# Patient Record
Sex: Female | Born: 1955 | Race: Black or African American | Hispanic: No | Smoking: Former smoker
Health system: Southern US, Community
[De-identification: ages and names within clinical notes are randomized; demographics above are authoritative.]

## PROBLEM LIST (undated history)

## (undated) DIAGNOSIS — J449 Chronic obstructive pulmonary disease, unspecified: Secondary | ICD-10-CM

## (undated) DIAGNOSIS — I739 Peripheral vascular disease, unspecified: Secondary | ICD-10-CM

## (undated) DIAGNOSIS — K859 Acute pancreatitis without necrosis or infection, unspecified: Secondary | ICD-10-CM

## (undated) DIAGNOSIS — I5030 Unspecified diastolic (congestive) heart failure: Secondary | ICD-10-CM

## (undated) DIAGNOSIS — R569 Unspecified convulsions: Secondary | ICD-10-CM

## (undated) DIAGNOSIS — I10 Essential (primary) hypertension: Secondary | ICD-10-CM

## (undated) DIAGNOSIS — F1027 Alcohol dependence with alcohol-induced persisting dementia: Secondary | ICD-10-CM

## (undated) DIAGNOSIS — I639 Cerebral infarction, unspecified: Secondary | ICD-10-CM

## (undated) DIAGNOSIS — I4892 Unspecified atrial flutter: Secondary | ICD-10-CM

## (undated) HISTORY — PX: SHOULDER SURGERY: SHX246

---

## 2016-07-21 ENCOUNTER — Inpatient Hospital Stay (HOSPITAL_COMMUNITY)
Admission: EM | Admit: 2016-07-21 | Discharge: 2016-07-26 | DRG: 292 | Disposition: A | Discharge status: Home Health | Payer: Medicare (Managed Care) | Attending: Internal Medicine | Admitting: Internal Medicine

## 2016-07-21 ENCOUNTER — Inpatient Hospital Stay (HOSPITAL_COMMUNITY): Payer: Medicare (Managed Care)

## 2016-07-21 ENCOUNTER — Emergency Department (HOSPITAL_COMMUNITY): Payer: Medicare (Managed Care)

## 2016-07-21 ENCOUNTER — Encounter (HOSPITAL_COMMUNITY): Payer: Self-pay | Admitting: *Deleted

## 2016-07-21 DIAGNOSIS — R569 Unspecified convulsions: Secondary | ICD-10-CM | POA: Diagnosis present

## 2016-07-21 DIAGNOSIS — I9589 Other hypotension: Secondary | ICD-10-CM | POA: Diagnosis not present

## 2016-07-21 DIAGNOSIS — Z8249 Family history of ischemic heart disease and other diseases of the circulatory system: Secondary | ICD-10-CM

## 2016-07-21 DIAGNOSIS — J449 Chronic obstructive pulmonary disease, unspecified: Secondary | ICD-10-CM | POA: Diagnosis present

## 2016-07-21 DIAGNOSIS — I480 Paroxysmal atrial fibrillation: Secondary | ICD-10-CM | POA: Diagnosis present

## 2016-07-21 DIAGNOSIS — D649 Anemia, unspecified: Secondary | ICD-10-CM | POA: Diagnosis present

## 2016-07-21 DIAGNOSIS — R197 Diarrhea, unspecified: Secondary | ICD-10-CM | POA: Diagnosis present

## 2016-07-21 DIAGNOSIS — R570 Cardiogenic shock: Principal | ICD-10-CM | POA: Diagnosis present

## 2016-07-21 DIAGNOSIS — R9431 Abnormal electrocardiogram [ECG] [EKG]: Secondary | ICD-10-CM | POA: Diagnosis not present

## 2016-07-21 DIAGNOSIS — K861 Other chronic pancreatitis: Secondary | ICD-10-CM | POA: Diagnosis present

## 2016-07-21 DIAGNOSIS — Z452 Encounter for adjustment and management of vascular access device: Secondary | ICD-10-CM | POA: Diagnosis not present

## 2016-07-21 DIAGNOSIS — F1027 Alcohol dependence with alcohol-induced persisting dementia: Secondary | ICD-10-CM | POA: Diagnosis present

## 2016-07-21 DIAGNOSIS — E440 Moderate protein-calorie malnutrition: Secondary | ICD-10-CM | POA: Diagnosis present

## 2016-07-21 DIAGNOSIS — I959 Hypotension, unspecified: Secondary | ICD-10-CM | POA: Diagnosis not present

## 2016-07-21 DIAGNOSIS — R55 Syncope and collapse: Secondary | ICD-10-CM | POA: Diagnosis present

## 2016-07-21 DIAGNOSIS — Z8673 Personal history of transient ischemic attack (TIA), and cerebral infarction without residual deficits: Secondary | ICD-10-CM

## 2016-07-21 DIAGNOSIS — I481 Persistent atrial fibrillation: Secondary | ICD-10-CM | POA: Diagnosis not present

## 2016-07-21 DIAGNOSIS — I272 Pulmonary hypertension, unspecified: Secondary | ICD-10-CM | POA: Diagnosis present

## 2016-07-21 DIAGNOSIS — I950 Idiopathic hypotension: Secondary | ICD-10-CM | POA: Diagnosis not present

## 2016-07-21 DIAGNOSIS — I739 Peripheral vascular disease, unspecified: Secondary | ICD-10-CM | POA: Diagnosis present

## 2016-07-21 DIAGNOSIS — I5032 Chronic diastolic (congestive) heart failure: Secondary | ICD-10-CM | POA: Diagnosis present

## 2016-07-21 DIAGNOSIS — R000 Tachycardia, unspecified: Secondary | ICD-10-CM | POA: Diagnosis present

## 2016-07-21 DIAGNOSIS — I130 Hypertensive heart and chronic kidney disease with heart failure and stage 1 through stage 4 chronic kidney disease, or unspecified chronic kidney disease: Secondary | ICD-10-CM | POA: Diagnosis present

## 2016-07-21 DIAGNOSIS — Z9181 History of falling: Secondary | ICD-10-CM

## 2016-07-21 DIAGNOSIS — I951 Orthostatic hypotension: Secondary | ICD-10-CM | POA: Diagnosis not present

## 2016-07-21 DIAGNOSIS — Z681 Body mass index (BMI) 19 or less, adult: Secondary | ICD-10-CM

## 2016-07-21 DIAGNOSIS — N183 Chronic kidney disease, stage 3 (moderate): Secondary | ICD-10-CM | POA: Diagnosis present

## 2016-07-21 DIAGNOSIS — I4891 Unspecified atrial fibrillation: Secondary | ICD-10-CM | POA: Diagnosis not present

## 2016-07-21 DIAGNOSIS — I4892 Unspecified atrial flutter: Secondary | ICD-10-CM | POA: Diagnosis present

## 2016-07-21 DIAGNOSIS — N179 Acute kidney failure, unspecified: Secondary | ICD-10-CM | POA: Diagnosis present

## 2016-07-21 DIAGNOSIS — E872 Acidosis: Secondary | ICD-10-CM | POA: Diagnosis present

## 2016-07-21 HISTORY — DX: Acute pancreatitis without necrosis or infection, unspecified: K85.90

## 2016-07-21 HISTORY — DX: Alcohol dependence with alcohol-induced persisting dementia: F10.27

## 2016-07-21 HISTORY — PX: CENTRAL LINE INSERTION: NUR33

## 2016-07-21 HISTORY — DX: Cerebral infarction, unspecified: I63.9

## 2016-07-21 HISTORY — DX: Peripheral vascular disease, unspecified: I73.9

## 2016-07-21 HISTORY — DX: Unspecified convulsions: R56.9

## 2016-07-21 HISTORY — DX: Essential (primary) hypertension: I10

## 2016-07-21 LAB — I-STAT CHEM 8, ED
BUN: 22 mg/dL — ABNORMAL HIGH (ref 6–20)
CREATININE: 1.2 mg/dL — AB (ref 0.44–1.00)
Calcium, Ion: 1.12 mmol/L — ABNORMAL LOW (ref 1.15–1.40)
Chloride: 105 mmol/L (ref 101–111)
Glucose, Bld: 90 mg/dL (ref 65–99)
HEMATOCRIT: 33 % — AB (ref 36.0–46.0)
HEMOGLOBIN: 11.2 g/dL — AB (ref 12.0–15.0)
Potassium: 4.7 mmol/L (ref 3.5–5.1)
Sodium: 140 mmol/L (ref 135–145)
TCO2: 24 mmol/L (ref 0–100)

## 2016-07-21 LAB — CBC WITH DIFFERENTIAL/PLATELET
Basophils Absolute: 0 10*3/uL (ref 0.0–0.1)
Basophils Relative: 0 %
Eosinophils Absolute: 0 10*3/uL (ref 0.0–0.7)
Eosinophils Relative: 1 %
HEMATOCRIT: 34.8 % — AB (ref 36.0–46.0)
HEMOGLOBIN: 11 g/dL — AB (ref 12.0–15.0)
LYMPHS ABS: 1.5 10*3/uL (ref 0.7–4.0)
LYMPHS PCT: 32 %
MCH: 26.6 pg (ref 26.0–34.0)
MCHC: 31.6 g/dL (ref 30.0–36.0)
MCV: 84.3 fL (ref 78.0–100.0)
Monocytes Absolute: 0.3 10*3/uL (ref 0.1–1.0)
Monocytes Relative: 6 %
NEUTROS PCT: 61 %
Neutro Abs: 3 10*3/uL (ref 1.7–7.7)
Platelets: 153 10*3/uL (ref 150–400)
RBC: 4.13 MIL/uL (ref 3.87–5.11)
RDW: 13.8 % (ref 11.5–15.5)
WBC: 4.8 10*3/uL (ref 4.0–10.5)

## 2016-07-21 LAB — URINALYSIS, ROUTINE W REFLEX MICROSCOPIC
Bilirubin Urine: NEGATIVE
GLUCOSE, UA: NEGATIVE mg/dL
HGB URINE DIPSTICK: NEGATIVE
Ketones, ur: NEGATIVE mg/dL
Nitrite: NEGATIVE
Protein, ur: 100 mg/dL — AB
SPECIFIC GRAVITY, URINE: 1.023 (ref 1.005–1.030)
pH: 5 (ref 5.0–8.0)

## 2016-07-21 LAB — TROPONIN I

## 2016-07-21 LAB — RAPID URINE DRUG SCREEN, HOSP PERFORMED
AMPHETAMINES: NOT DETECTED
Barbiturates: NOT DETECTED
Benzodiazepines: NOT DETECTED
Cocaine: NOT DETECTED
Opiates: NOT DETECTED
TETRAHYDROCANNABINOL: NOT DETECTED

## 2016-07-21 LAB — ECHOCARDIOGRAM COMPLETE
Height: 63 in
WEIGHTICAEL: 1600 [oz_av]

## 2016-07-21 LAB — BRAIN NATRIURETIC PEPTIDE: B NATRIURETIC PEPTIDE 5: 2207.7 pg/mL — AB (ref 0.0–100.0)

## 2016-07-21 LAB — COMPREHENSIVE METABOLIC PANEL
ALBUMIN: 3.9 g/dL (ref 3.5–5.0)
ALT: 33 U/L (ref 14–54)
ANION GAP: 9 (ref 5–15)
AST: 46 U/L — ABNORMAL HIGH (ref 15–41)
Alkaline Phosphatase: 83 U/L (ref 38–126)
BUN: 19 mg/dL (ref 6–20)
CHLORIDE: 108 mmol/L (ref 101–111)
CO2: 22 mmol/L (ref 22–32)
Calcium: 8.8 mg/dL — ABNORMAL LOW (ref 8.9–10.3)
Creatinine, Ser: 1.18 mg/dL — ABNORMAL HIGH (ref 0.44–1.00)
GFR calc non Af Amer: 50 mL/min — ABNORMAL LOW (ref >60)
GFR, EST AFRICAN AMERICAN: 58 mL/min — AB (ref >60)
GLUCOSE: 94 mg/dL (ref 65–99)
Potassium: 4.7 mmol/L (ref 3.5–5.1)
SODIUM: 139 mmol/L (ref 135–145)
Total Bilirubin: 0.6 mg/dL (ref 0.3–1.2)
Total Protein: 6.6 g/dL (ref 6.5–8.1)

## 2016-07-21 LAB — POC OCCULT BLOOD, ED: Fecal Occult Bld: POSITIVE — AB

## 2016-07-21 LAB — PHENYTOIN LEVEL, TOTAL: Phenytoin Lvl: <2.5 ug/mL — ABNORMAL LOW (ref 10.0–20.0)

## 2016-07-21 LAB — ABO/RH: ABO/RH(D): A POS

## 2016-07-21 LAB — CORTISOL: Cortisol, Plasma: 32.4 ug/dL

## 2016-07-21 LAB — TYPE AND SCREEN
ABO/RH(D): A POS
Antibody Screen: NEGATIVE

## 2016-07-21 LAB — I-STAT CG4 LACTIC ACID, ED
LACTIC ACID, VENOUS: 3.37 mmol/L — AB (ref 0.5–1.9)
LACTIC ACID, VENOUS: 1.43 mmol/L (ref 0.5–1.9)

## 2016-07-21 LAB — LIPASE, BLOOD: Lipase: 12 U/L (ref 11–51)

## 2016-07-21 LAB — MRSA PCR SCREENING: MRSA by PCR: NEGATIVE

## 2016-07-21 LAB — PROTIME-INR
INR: 1.19
Prothrombin Time: 15.2 seconds (ref 11.4–15.2)

## 2016-07-21 LAB — I-STAT TROPONIN, ED: TROPONIN I, POC: 0.02 ng/mL (ref 0.00–0.08)

## 2016-07-21 MED ORDER — PIPERACILLIN-TAZOBACTAM 3.375 G IVPB
3.3750 g | Freq: Three times a day (TID) | INTRAVENOUS | Status: DC
  Administered 2016-07-21 – 2016-07-23 (×6): 3.375 g via INTRAVENOUS
  Filled 2016-07-21 (×7): qty 50

## 2016-07-21 MED ORDER — HEPARIN SODIUM (PORCINE) 5000 UNIT/ML IJ SOLN
5000.0000 [IU] | Freq: Three times a day (TID) | INTRAMUSCULAR | Status: DC
  Administered 2016-07-21 – 2016-07-25 (×9): 5000 [IU] via SUBCUTANEOUS
  Filled 2016-07-21 (×13): qty 1

## 2016-07-21 MED ORDER — SODIUM CHLORIDE 0.9 % IV BOLUS (SEPSIS)
30.0000 mL/kg | Freq: Once | INTRAVENOUS | Status: AC
  Administered 2016-07-21: 1362 mL via INTRAVENOUS

## 2016-07-21 MED ORDER — PHENYLEPHRINE HCL 10 MG/ML IJ SOLN
0.0000 ug/min | Freq: Once | INTRAVENOUS | Status: AC
  Administered 2016-07-21: 20 ug/min via INTRAVENOUS
  Filled 2016-07-21: qty 1

## 2016-07-21 MED ORDER — PIPERACILLIN-TAZOBACTAM 3.375 G IVPB 30 MIN
3.3750 g | Freq: Once | INTRAVENOUS | Status: AC
  Administered 2016-07-21: 3.375 g via INTRAVENOUS
  Filled 2016-07-21: qty 50

## 2016-07-21 MED ORDER — LIDOCAINE HCL (PF) 1 % IJ SOLN
INTRAMUSCULAR | Status: AC
  Filled 2016-07-21: qty 5

## 2016-07-21 MED ORDER — SODIUM CHLORIDE 0.9 % IV BOLUS (SEPSIS)
1000.0000 mL | Freq: Once | INTRAVENOUS | Status: AC
  Administered 2016-07-21: 1000 mL via INTRAVENOUS

## 2016-07-21 MED ORDER — SODIUM CHLORIDE 0.9 % IV SOLN
Freq: Once | INTRAVENOUS | Status: AC
  Administered 2016-07-21: 11:00:00 via INTRAVENOUS

## 2016-07-21 MED ORDER — PHENYLEPHRINE HCL 10 MG/ML IJ SOLN
0.0000 ug/min | INTRAVENOUS | Status: DC
  Administered 2016-07-21: 20 ug/min via INTRAVENOUS
  Filled 2016-07-21: qty 1

## 2016-07-21 MED ORDER — SODIUM CHLORIDE 0.9 % IV SOLN: 250.0000 mL | INTRAVENOUS | Status: DC | PRN

## 2016-07-21 MED ORDER — VANCOMYCIN HCL IN DEXTROSE 750-5 MG/150ML-% IV SOLN
750.0000 mg | INTRAVENOUS | Status: DC
  Administered 2016-07-22 – 2016-07-23 (×2): 750 mg via INTRAVENOUS
  Filled 2016-07-21 (×2): qty 150

## 2016-07-21 MED ORDER — VANCOMYCIN HCL IN DEXTROSE 1-5 GM/200ML-% IV SOLN
1000.0000 mg | Freq: Once | INTRAVENOUS | Status: AC
  Administered 2016-07-21: 1000 mg via INTRAVENOUS
  Filled 2016-07-21: qty 200

## 2016-07-21 MED ORDER — ASPIRIN 81 MG PO CHEW
81.0000 mg | CHEWABLE_TABLET | Freq: Every day | ORAL | Status: DC
  Administered 2016-07-21 – 2016-07-26 (×6): 81 mg via ORAL
  Filled 2016-07-21 (×6): qty 1

## 2016-07-21 MED ORDER — LEVETIRACETAM 750 MG PO TABS
750.0000 mg | ORAL_TABLET | Freq: Two times a day (BID) | ORAL | Status: DC
  Administered 2016-07-21 – 2016-07-26 (×9): 750 mg via ORAL
  Filled 2016-07-21 (×13): qty 1

## 2016-07-21 NOTE — ED Triage Notes (Signed)
Patient comes in [facility] post poss syncopal episode. EMS arrival patient a/ox4, but bp low and weak pulses. EMS BP 85/64, HR 82-135 afibrvr. Hx dementia, hypotension, seizures. Given 200 of NS fluids in route. 22 in [place]. fsbs 152.

## 2016-07-21 NOTE — Progress Notes (Signed)
[facility] Progress Note Patient Name: Brittany [name] DOB: [birth year] MRN: [id number]   Date of Service  07/21/2016  HPI/Events of Note  No DVT prophylaxis  eICU Interventions  Sub q heparin     Intervention Category Intermediate Interventions: Best-practice therapies (e.g. DVT, beta blocker, etc.)  Simonne Maffucci 07/21/2016, 8:13 PM

## 2016-07-21 NOTE — Consult Note (Addendum)
Cardiology Consult    Patient ID: Brittany [name] MRN: [id number], DOB/AGE: 01/23/1956   Admit date: 07/21/2016 Date of Consult: 07/21/2016  Primary Physician: No PCP Per Patient Primary Cardiologist: New Requesting Provider: Dr. Johnney Killian Reason for Consultation: ?Cardiogenic Shock, Hypotension  Patient Profile    61 yo female with dementia, seizures, CHF, AF, HTN, CVA, CKD III, COPD, chronic alcoholism and PAD who presented to the ED with c/o generalized weakness, dizziness, and diaphoresis.   Past Medical History   Past Medical History:  Diagnosis Date  . Atrial fibrillation ([place])   . CHF (congestive heart failure) ([place])   . CVA (cerebral vascular accident) ([place])   . 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 HTN (hypertension)   . PAD (peripheral artery disease) ([place])   . Pancreatitis   . Seizures ([place])     No past surgical history on file.   Allergies  Not on File  History of Present Illness    Brittany [name] is a 61 yo female with PMH of dementia, seizures, gluacoma, pancreatitis, CHF (unknown type),  AF, CVA, CKD III, COPD and PAD. She is currently a patient of [facility]. Lives with her son and his family currently. According to notes from her PCP, Dr. Bradd Burner at her last visit her dementia had further progressed. She was started on Seroquel to help with agitation. She was reportedly was hospitalized in 9/17 after having a seizure. 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 During this hospitalization her primary team made the choice to stop her anticoagulation, Xarelto, given reports of ongoing falls. She was continued on 325 ASA daily.   Difficult to obtain hx as patient has dementia and family is not currently at the bedside. According to notes, and ED MD Brittany [name] was brought to the ED via EMS after experiencing generalized weakness and dizziness. The patient reports she was in her usual state of health up until this  morning. States she felt "wozzy" this morning when she got up. When to the bathroom and washed up. Had a bowel movement, and the came into the living room. States she then felt dizzy and weak. Sat down on the couch and family reported a questionable syncopal episode. They do not think that she lost consciousness. No reported seizure like activity. She did become cold and clammy. Denies any chest pain, dyspnea, nausea/vomiting, or lower extremity edema. On EMS arrival she was noted to be hypotensive with systolic BP in the 44W.   In the ED her labs showed stable electrolytes, Hgb 11.0, trop 0.02, lactic acid 3.37. FOBT was positive. CXR showed mild cardiomegaly, but no acute findings. She was given fluid boluses with little improvement in her pressure. Also started on board spectrum antibiotics. PCCM has been called for admission, and central line placement. EKG showed atrial flutter with repolarization abnormalities in diffuse leads, prominent in v2-v4 with prolonged QT. Bedside US done by PCCM reported reduced EF, some LV wall motion abnormalities and dilated LA.  Inpatient Medications    . lidocaine (PF)      . [START ON 07/22/2016] vancomycin  750 mg Intravenous Q24H    Family History    Family History  Problem Relation Age of Onset  . Hypertension Father     Social History    Social History   Social History  . Marital status: Widowed    Spouse name: N/A  . Number of children: N/A  . Years of  education: N/A   Occupational History  . Not on file.   Social History Main Topics  . Smoking status: Former Research scientist (life sciences)  . Smokeless tobacco: Never Used  . Alcohol use Yes  . Drug use: No  . Sexual activity: Not on file   Other Topics Concern  . Not on file   Social History Narrative  . No narrative on file     Review of Systems   General:  No chills, fever, night sweats or weight changes.  Cardiovascular: See HPI Dermatological: No rash, lesions/masses Respiratory: No cough,  dyspnea Urologic: No hematuria, dysuria Abdominal:   No nausea, vomiting, diarrhea, bright red blood per rectum, melena, or hematemesis Neurologic:  No visual changes, wkns, changes in mental status. All other systems reviewed and are otherwise negative except as noted above.  Physical Exam    Blood pressure (!) 83/62, pulse 75, temperature 97.4 F (36.3 C), temperature source Oral, resp. rate 19, height '5\' 3"'$  (1.6 m), weight 100 lb (45.4 kg), SpO2 (!) 62 %.  General: Pleasant but confused AAF, NAD Psych: Normal affect. Neuro: Alert and oriented X 3. Moves all extremities spontaneously. HEENT: Normal  Neck: Supple without bruits or JVD. Lungs:  Resp regular and unlabored, CTA. Heart: Irreg Irreg no s3, s4, or murmurs. Abdomen: Soft, non-tender, non-distended, BS + x 4.  Extremities: No clubbing, cyanosis or edema. DP/PT/Radials 2+ and equal bilaterally. Upper and lower extremities cool to touch.   Labs    Troponin (Point of Care Test)  Recent Labs  07/21/16 1204  TROPIPOC 0.02   No results for input(s): CKTOTAL, CKMB, TROPONINI in the last 72 hours. Lab Results  Component Value Date   WBC 4.8 07/21/2016   HGB 11.2 (L) 07/21/2016   HCT 33.0 (L) 07/21/2016   MCV 84.3 07/21/2016   PLT 153 07/21/2016     Recent Labs Lab 07/21/16 1140 07/21/16 1206  NA 139 140  K 4.7 4.7  CL 108 105  CO2 22  --   BUN 19 22*  CREATININE 1.18* 1.20*  CALCIUM 8.8*  --   PROT 6.6  --   BILITOT 0.6  --   ALKPHOS 83  --   ALT 33  --   AST 46*  --   GLUCOSE 94 90   No results found for: CHOL, HDL, LDLCALC, TRIG No results found for: [facility]   Radiology Studies    Dg Chest Port 1 View  Result Date: 07/21/2016 CLINICAL DATA:  Central line placement EXAM: PORTABLE CHEST 1 VIEW COMPARISON:  07/21/2016 FINDINGS: There is a left jugular central venous catheter with the tip projecting over the SVC. There is bilateral diffuse interstitial thickening. There is a trace right pleural effusion.  There is no left pleural effusion. There is no pneumothorax. There is mild stable cardiomegaly. The osseous structures are unremarkable. IMPRESSION: Mild CHF. Left jugular central venous catheter with the tip projecting over the SVC. Electronically Signed   By: Kathreen Devoid   On: 07/21/2016 15:12   Dg Chest Port 1 View  Result Date: 07/21/2016 CLINICAL DATA:  Syncope.  Chest pain.  Shortness of breath. EXAM: PORTABLE CHEST 1 VIEW COMPARISON:  None. FINDINGS: There is borderline cardiomegaly. Pulmonary vascularity is normal and the lungs are clear. No acute bone abnormality. Old nonunion fracture of the distal right clavicle. IMPRESSION: Borderline cardiomegaly.  No other significant abnormalities. Electronically Signed   By: Lorriane Shire M.D.   On: 07/21/2016 11:23    ECG & Cardiac Imaging  EKG: atrial flutter with repolarization abnormalities in diffuse leads, prominent in v2-v4 with prolonged QT.  Echo: LVH, Normal LV size and function. Normal RV size and function. Biatrial enlargement and no pericardial effusion. (Quick look at Stat echo done at 4:25 PM in ER). [facility]  Assessment & Plan    62 yo female with dementia, seizures, CHF, AF, HTN, CVA, CKD III, COPD, chronic alcoholism and PAD who presented to the ED with c/o generalized weakness, dizziness, and diaphoresis.   1. Hypotension: Cardiology has been called in relation to concern for cardiogenic shock given her low blood pressure and slightly elevated HR. In review of records it appears that her baseline BPs run in the low 90s, she is not on any antihypertensive agents. Her mentation is at her baseline according to family. Last blood pressure noted on the monitor was 283 systolic. Denies any chest pain or dyspnea at this time.  -- Will obtain stat echo to assess for effusion, thrombus, evidence of heart strain - see above.  2. Hx of AF: Seems this is a hx of PAF. EKG this admission shows Atrial flutter. Notes from her PCP show that she  was taken off Xarelto, and continued only on ASA 325 given hx of falls.   3. Elevated Lactic Acid: Currently being treated with antibiotics started in the ED. CXR clear, UA pending.   4. CKD III: Cr 1.2 on admission. Unclear her baseline, not found in records.   5. CHF: Unknown type. Does not appear to be volume overloaded at this time. Will check BNP as she has been on oral lasix in the past, but not on current medication list.   6. Hypotension: Given IV fluids in the ED with some improvement in pressure. Notes from her PCP show soft blood pressures in the past. Was given 3L of IV fluids in the ED. Will need to monitor volume status given reported hx of CHF.  Barnet Pall, NP-C Pager [phone] 07/21/2016, 4:01 PM   The patient has been seen in conjunction with Reino Bellis, NP-C. All aspects of care have been considered and discussed. The patient has been personally interviewed, examined, and all clinical data has been reviewed.   Obtained data from [place] on this 61 yo female with dementia, seizure disorder, CHF, PAF(no anticoagulation) on once a day Sotalol, HTN, CVA, CKD III, COPD, chronic alcoholism and PAD who presented to the ED with c/o generalized weakness, dizziness, and diaphoresis. Denies chest pain ans dyspnea. H/O baseline SBP running in mid 90's.  In ER SBP < 80 mmHg, HR 80, hypoxia, ECG atrial flutter with variable response and repolarization abnormality, lactic acidosis and stat echo with LVH, EF >50%, normal right heart size and no pericardial effusion.  Etiology of hypotension is not clear. Not cardiogenic in etiology. R/O sepsis vs medication excess.  Plan cycle markers, IV fluid, hold sedatives, culture. Sotalol 80 mg once daily is not helpful and should be stopped. If rhythm control needed, consider amiodarone.  We will follow.

## 2016-07-21 NOTE — H&P (Addendum)
Name: Brittany [name] MRN: [id number] DOB: Mar 30, 1956    LOS: 0  PCCM ADMISSION NOTE  History of Present Illness: Brittany [name], 61 y.o lady with past medical history significant for seizures  brought to ED after experiencing generalized weakness and dizziness. She is a patient at [facility] program, when she was getting ready to go there this morning she felt sudden generalized weakness and dizziness, associated with diaphoresis, which make her to sit down, according to son she never lost consciousness, he denies any seizure-like activity.the patient became cold and clammy.She denies any chest pain or palpitations.  According to her son she did had a couple of TIA in the past ,  no documented history available, patient and her son denied any history of heart problem. She denies any headache , but do endorse having some blurry vision during this event.she denies any focal weakness. No history of recent fever, upper respiratory symptoms, nausea, vomiting or diarrhea.No urinary complaints.    Lines / Drains: 1/5 IJ>>>  Cultures: Blood Culture>>  Antibiotics: Vanc. Zosyn  Tests / Events:     Past Medical History:  Diagnosis Date  . Seizures ([place])    No past surgical history on file. Prior to Admission medications   Not on File   Allergies Not on File  Family History No family history on file.  Social History  reports that she has never smoked. She has never used smokeless tobacco. Her alcohol and drug histories are not on file.  Review Of Systems  11 points review of systems is negative with an exception of listed in HPI.  Vital Signs: Temp:  [97.4 F (36.3 C)] 97.4 F (36.3 C) (01/05 1031) Pulse Rate:  [62-75] 75 (01/05 1215) Resp:  [14-24] 20 (01/05 1300) BP: (59-100)/(44-73) 76/62 (01/05 1300) SpO2:  [34 %-62 %] 62 % (01/05 1215) Weight:  [45.4 kg (100 lb)] 45.4 kg (100 lb) (01/05 1026) No intake/output data recorded.  Physical Examination: General:  Lethargic  looking lady, in no acute distress. Neuro: Alert and oriented. Follow commands. Cranial nerves grossly intact. Strength and sensations grossly intact bilaterally.   HEENT:  PERRL, EOMI. no scleral icterus, Neck:  Supple, no JVD, no carotid bruit.  Cardiovascular:  Irregularly irregular. Lungs:  Clear bilaterally  Abdomen: Soft, nontender, nondistended, bowel sounds positive.  Musculoskeletal: No gross abnormality, no edema, perifrontal pulses present bilaterally. Skin:  Cold and moist.no rash noted.  Ventilator settings:    Labs and Imaging:  Reviewed.  Please refer to the Assessment and Plan section for relevant results.  DISCUSSION:   Ms. Brittany [name], 61 y.o lady with past medical history significant for seizures  brought to ED after experiencing generalized weakness and dizziness. She is a patient at [facility] program, when she was getting ready to go there this morning she felt sudden generalized weakness and dizziness, associated with diaphoresis, which make her to sit down, according to son she never lost consciousness, he denies any seizure-like activity.the patient became cold and clammy.She denies any chest pain or palpitations.  According to her son she did had a couple of TIA in the past ,  no documented history available, patient and her son denied any history of heart problem.   Bed Site ECHO shows reduced EF, some LV wall motion abnormalities and dilated LA. ECG significant for atrial flutter,ST abnormalities and prolonged QTc.No previous ECG to compare. She was hypotensive with BP of 60/42, tachycardic with lactic acidosis. No leukocytosis or fever. 3L of bolus were unable to bring her  BP up. Central line was placed for pressors. Her shock is more consistent with cardiogenic in origin. She was admitted to ICU for shock and pressor needs.  ASSESSMENT / PLAN:  PULMONARY A: No acute issue. P:   Supplemental O2 as needed to keep SpO2 > 92% Monitor  CARDIOVASCULAR A:   Shock - r/o Cardiogenic Unclear etiology AF  P:  Telemetry monitoring MAP goal 31mHg Phenylephrine titrated for MAP goal IVF hydration, some caution Heparin infusion  Trend Lactic acid Trend Trop. BNP Cardiology consult. ECHO Repeat ECG in the morning.  RENAL A:   Mildly Elevated Creatinine, Unknown baseline/CKD P:    Follow BMP  GASTROINTESTINAL A:   GI prophylaxis.  P:    PPI NPO  HEMATOLOGIC A:   Anemia   P:  Follow CBC  INFECTIOUS A:   SIRS/sepsis - NOT clear   P:   Broad spectrum antibiotics Follow cultures Trend PCT  ENDOCRINE A:   R/o rel AI  P:   Check Cortisol.  NEUROLOGIC A:   H/O Seizure, last episode in 06/17. H/O TIA?  P:   Continue home dose of Keppra and Phenytoin. CT head   FAMILY  - Updates: patient updated in ED  - Inter-disciplinary family meet or Palliative Care meeting due by:  1/12   SLorella Nimrod1/11/2016, 1:57 PM   STAFF NOTE: I, DMerrie Roof MD FACP have personally reviewed patient's available data, including medical history, events of note, physical examination and test results as part of my evaluation. I have discussed with resident/NP and other care providers such as pharmacist, RN and RRT. In addition, I personally evaluated patient and elicited key findings of: awake in bed, cold ext, no crackles, abdo soft, jvd wnl laying flat, MAP low, pcxr with fluid fissure rt, int edema ?, history was sudden change in ms that resolved, no clear seizure activity noted but possible still with lactic and history of seizures, but now continued shock state, fluid provided, empiric abx okay but its is not clear the etiology of her shock, I did limited echo  (see note), she is on sotalol and dilantin, assessing level dilantin, stat start neo  Then place line, get cvp, have cardiology evaluation has ST changes that may be chronic?, get pct, bnp, and have map goal 60 with repeat lactic acid, may need noninvasive  monitoring / swan assessment, would like to use pct for neg predictive value if able, I updated son at bedside in full , wait official stat echo, GET SVo2 The patient is critically ill with multiple organ systems failure and requires high complexity decision making for assessment and support, frequent evaluation and titration of therapies, application of advanced monitoring technologies and extensive interpretation of multiple databases.   Critical Care Time devoted to patient care services described in this note is 50 Minutes. This time reflects time of care of this signee: DMerrie Roof MD FACP. This critical care time does not reflect procedure time, or teaching time or supervisory time of PA/NP/Med student/Med Resident etc but could involve care discussion time. Rest per NP/medical resident whose note is outlined above and that I agree with   DLavon Paganini FTitus Mould MD, FBurtonPgr: [facility] 07/21/2016 4:22 PM

## 2016-07-21 NOTE — ED Notes (Signed)
ECHO tech at bedside.

## 2016-07-21 NOTE — ED Notes (Signed)
NP at bedside to place central line.

## 2016-07-21 NOTE — Procedures (Signed)
Limited echo 1 images poor parastenal and apical 2. LVH\ 3. Reduced LV fxn 40%? 4. No sig perciardial effusion 5. RV fxn intact and slender 6, LV cavity not dolated 7. Large atrium left  Lavon Paganini. Titus Mould, MD, [place] Pgr: [place] [facility]

## 2016-07-21 NOTE — ED Notes (Signed)
EMS 1 Liter NS bolus completed.

## 2016-07-21 NOTE — ED Notes (Signed)
EDP at bedside placing u/s IV. RN assisting.

## 2016-07-21 NOTE — ED Notes (Signed)
MD aware of low BP. Central line kit set up at bedside per MD request.

## 2016-07-21 NOTE — ED Notes (Signed)
Patient has been given a total of 3 Liters of NS. Patient a/ox4. bp still low. MD aware. Attempting to obtain better access.

## 2016-07-21 NOTE — Procedures (Signed)
Central Venous Catheter Insertion Procedure Note Brittany [name] [id number] 1956-03-13  Procedure: Insertion of Central Venous Catheter Indications: Drug and/or fluid administration  Procedure Details Consent: Risks of procedure as well as the alternatives and risks of each were explained to the (patient/caregiver).  Consent for procedure obtained. Time Out: Verified patient identification, verified procedure, site/side was marked, verified correct patient position, special equipment/implants available, medications/allergies/relevent history reviewed, required imaging and test results available.  Performed  Maximum sterile technique was used including antiseptics and cap, gown and sheets. Skin prep: Iodine solution; local anesthetic administered A antimicrobial bonded/coated triple lumen catheter was placed in the left internal jugular vein using the Seldinger technique.  Evaluation Blood flow good Complications: No apparent complications Patient did tolerate procedure well. Chest X-ray ordered to verify placement.  CXR: normal.  Sumayya Amin 07/21/2016, 4:03 PM [place] consented  Lavon Paganini. Titus Mould, MD, [place] Pgr: [place] [facility]

## 2016-07-21 NOTE — ED Provider Notes (Signed)
[facility] DEPT Provider Note   CSN: [id number] Arrival date & time: 07/21/16  1021     History   Chief Complaint Chief Complaint  Patient presents with  . Loss of Consciousness  . Atrial Fibrillation  . Hypotension    HPI Brittany [name] is a 61 y.o. female.  HPI Patient is alert and interactive but poor historian. There are no additional historians who witnessed the event. Patient states that she went into her bathroom to get "cleaned up" and that's last thing she really recalls. She thought perhaps she had a seizure because she has had them in the past. She reports the only medication she takes his Dilantin. She denies any symptoms that she really recalls upon awakening. She denies headache or chest pain. She doesn't recall having any symptoms preceding the event. Patient reports she's felt all right over the past 3 days. She doesn't endorse any specific positive symptoms on review of systems. She does however state that she's been anemic in the past and she is wondering if that might be the problem too. She denies taking daily aspirin or ibuprofen. She reports she takes Tylenol sometimes. She reports she lives with family members who called EMS but they're not here at this time for additional history. She denies knowledge of any prior diagnosis of atrial fibrillation. Past Medical History:  Diagnosis Date  . Seizures ([place])     There are no active problems to display for this patient.   No past surgical history on file.  OB History    No data available       Home Medications    Prior to Admission medications   Not on File    Family History No family history on file.  Social History Social History  Substance Use Topics  . Smoking status: Never Smoker  . Smokeless tobacco: Never Used  . Alcohol use Not on file     Allergies   Patient has no allergy information on record.   Review of Systems Review of Systems 10 Systems reviewed and are negative for  acute change except as noted in the HPI.  Physical Exam Updated Vital Signs BP (!) 70/61   Pulse 75   Temp 97.4 F (36.3 C) (Oral)   Resp 18   Ht '5\' 3"'$  (1.6 m)   Wt 100 lb (45.4 kg)   SpO2 (!) 62%   BMI 17.71 kg/m   Physical Exam  Constitutional:  Patient is very thin borderline cachectic. She is alert and interactive. No respiratory distress. Mental status is clear. Slightly pale in appearance.  HENT:  Head: Normocephalic and atraumatic.  Nose: Nose normal.  Mouth/Throat: Oropharynx is clear and moist.  Eyes: EOM are normal. Pupils are equal, round, and reactive to light.  Neck: Neck supple.  Cardiovascular:  Irregularly irregular tachycardia. Slightly distant heart sounds. Do not appreciate murmur.  Pulmonary/Chest:  Patient does not exhibit any respiratory distress at rest. Breath sounds are soft. They seem diminished at the right base. No gross rails or rhonchi.  Abdominal: Soft. She exhibits no distension. There is no tenderness. There is no guarding.  Genitourinary:  Genitourinary Comments: Stool is bright yellow and mucousy. No melena or blood.  Musculoskeletal: Normal range of motion. She exhibits no edema or tenderness.  Patient does not have any peripheral edema or calf tenderness. However have fingers that are hypoperfused in a distinct line very suggestive of Raynaud type perfusion pattern. Left hand has heard fourth and fifth digits with pallor of  the distal portions from the MCP. The index and thumb have normal-appearing perfusion. On the right, digits 2 through 5 have similar appearance. Thumb appears to have normal perfusion. Feet are slightly cool but the perfusion appears normal without any pallor of digits. No Edema.  Neurological: She is alert. No cranial nerve deficit. She exhibits normal muscle tone. Coordination normal.  Skin: Skin is warm and dry. There is pallor.  Psychiatric: She has a normal mood and affect.     ED Treatments / Results  Labs (all  labs ordered are listed, but only abnormal results are displayed) Labs Reviewed  COMPREHENSIVE METABOLIC PANEL - Abnormal; Notable for the following:       Result Value   Creatinine, Ser 1.18 (*)    Calcium 8.8 (*)    AST 46 (*)    GFR calc non Af Amer 50 (*)    GFR calc Af Amer 58 (*)    All other components within normal limits  CBC WITH DIFFERENTIAL/PLATELET - Abnormal; Notable for the following:    Hemoglobin 11.0 (*)    HCT 34.8 (*)    All other components within normal limits  I-STAT CG4 LACTIC ACID, ED - Abnormal; Notable for the following:    Lactic Acid, Venous 3.37 (*)    All other components within normal limits  I-STAT CHEM 8, ED - Abnormal; Notable for the following:    BUN 22 (*)    Creatinine, Ser 1.20 (*)    Calcium, Ion 1.12 (*)    Hemoglobin 11.2 (*)    HCT 33.0 (*)    All other components within normal limits  POC OCCULT BLOOD, ED - Abnormal; Notable for the following:    Fecal Occult Bld POSITIVE (*)    All other components within normal limits  CULTURE, BLOOD (ROUTINE X 2)  CULTURE, BLOOD (ROUTINE X 2)  URINE CULTURE  LIPASE, BLOOD  PROTIME-INR  URINALYSIS, ROUTINE W REFLEX MICROSCOPIC  RAPID URINE DRUG SCREEN, HOSP PERFORMED  PHENYTOIN LEVEL, TOTAL  I-STAT TROPOININ, ED  I-STAT CG4 LACTIC ACID, ED  I-STAT CG4 LACTIC ACID, ED  TYPE AND SCREEN  ABO/RH    EKG  EKG Interpretation  Date/Time:  Friday July 21 2016 11:16:52 EST Ventricular Rate:  114 PR Interval:    QRS Duration: 77 QT Interval:  394 QTC Calculation: 578 R Axis:   80 Text Interpretation:  Atrial flutter Repol abnrm suggests ischemia, diffuse leads Prolonged QT interval progressed ST elevation with reciprocal change. Ischemic. Confirmed by Johnney Killian, MD, Jeannie Done [id number]) on 07/21/2016 11:42:49 AM       Radiology Dg Chest Port 1 View  Result Date: 07/21/2016 CLINICAL DATA:  Syncope.  Chest pain.  Shortness of breath. EXAM: PORTABLE CHEST 1 VIEW COMPARISON:  None. FINDINGS: There is  borderline cardiomegaly. Pulmonary vascularity is normal and the lungs are clear. No acute bone abnormality. Old nonunion fracture of the distal right clavicle. IMPRESSION: Borderline cardiomegaly.  No other significant abnormalities. Electronically Signed   By: Lorriane Shire M.D.   On: 07/21/2016 11:23    Procedures Procedures (including critical care time) CRITICAL CARE Performed by: Charlesetta Shanks   Total critical care time: 60 minutes  Critical care time was exclusive of separately billable procedures and treating other patients.  Critical care was necessary to treat or prevent imminent or life-threatening deterioration.  Critical care was time spent personally by me on the following activities: development of treatment plan with patient and/or surrogate as well as nursing, discussions with consultants,  evaluation of patient's response to treatment, examination of patient, obtaining history from patient or surrogate, ordering and performing treatments and interventions, ordering and review of laboratory studies, ordering and review of radiographic studies, pulse oximetry and re-evaluation of patient's condition.  Angiocath insertion Performed by: Charlesetta Shanks  Consent: Verbal consent obtained. Risks and benefits: risks, benefits and alternatives were discussed Time out: Immediately prior to procedure a "time out" was called to verify the correct patient, procedure, equipment, support staff and site/side marked as required.  Preparation: Patient was prepped and draped in the usual sterile fashion.  Vein Location: left AC  Ultrasound Guided  Gauge: 20  Several attempts at cannulating antecubital vein. Was unable to thread the catheter and get blood return.   Medications Ordered in ED Medications  lidocaine (PF) (XYLOCAINE) 1 % injection (not administered)  vancomycin (VANCOCIN) IVPB 750 mg/150 ml premix (not administered)  piperacillin-tazobactam (ZOSYN) IVPB 3.375 g  (not administered)  phenylephrine (NEO-SYNEPHRINE) 10 mg in dextrose 5 % 250 mL (0.04 mg/mL) infusion (not administered)  0.9 %  sodium chloride infusion ( Intravenous New Bag/Given 07/21/16 1113)  sodium chloride 0.9 % bolus 1,000 mL (0 mLs Intravenous Stopped 07/21/16 1411)  piperacillin-tazobactam (ZOSYN) IVPB 3.375 g (3.375 g Intravenous New Bag/Given 07/21/16 1229)  vancomycin (VANCOCIN) IVPB 1000 mg/200 mL premix (1,000 mg Intravenous New Bag/Given 07/21/16 1230)  sodium chloride 0.9 % bolus 1,362 mL (0 mL/kg  45.4 kg Intravenous Stopped 07/21/16 1411)     Initial Impression / Assessment and Plan / ED Course  I have reviewed the triage vital signs and the nursing notes.  Pertinent labs & imaging results that were available during my care of the patient were reviewed by me and considered in my medical decision making (see chart for details).  Clinical Course   Consultation: EKG reviewed Dr. Zoe Lan. Appears more likely ST changes are due to atrial flutter pattern and not acute ischemia. Will continue medical evaluation and treatment.   11:15 reassessed. Nurse reports patient's systolic blood pressure dropped to mid 70s. Patient remains alert without respiratory distress. She does not report any change in her general feeling. Interestingly, the Raynaud like appearance of select digits on the hand has resolved and now all of the fingers appear perfused. Still suspect anemia due to pallor. Will have nursing staff send i-STAT. 12:20 despite low blood pressure, patient's condition has not changed. Mental status remains stable and no respiratory distress. Currently patient refuses any IV attempt in her neck to place central line. She cites pain and too risky. Will make an attempt for a third, larger peripheral IV site and readdress need for central line with patient.  Consult: Intensivist. Dr. Titus Mould has evaluated the patient in the emergency department. Will initiate Neo-Synephrine. Final Clinical  Impressions(s) / ED Diagnoses   Final diagnoses:  Syncope and collapse  Hypotension, unspecified hypotension type  Atrial flutter, unspecified type [facility])  Patient presents with a syncopal type episode and limited symptoms leading up to this. Patient remained persistently hypotensive. Fluid hydration and sepsis protocol initiated. Patient's first chest x-ray does not show volume overloaded she does not have peripheral edema or rails. However, after all other results are returned and patient does not show signs of anemia, leukocytosis, unlikely medication error as medications are all administered to the patient and placed simple boxes, no focal sign of infection identified, Dr. Titus Mould suggests this may be cardiac dysfunction with significantly thickened LV identified on bedside ultrasound. Will initiate Neo-Synephrine drip and further management will be  per intensivists with ICU admission.  New Prescriptions New Prescriptions   No medications on file     Charlesetta Shanks, MD 07/21/16 1520

## 2016-07-21 NOTE — ED Notes (Signed)
This RN attempted x2 for IV access without success.

## 2016-07-21 NOTE — Progress Notes (Signed)
Pharmacy Antibiotic Note  Brittany [name] is a 61 y.o. female admitted on 07/21/2016 with sepsis.  Pharmacy has been consulted for vancomycin and zosyn dosing.  Patient received vancomycin 1g and zosyn 3.375g IV once in the ED.  Plan: Vancomycin '750mg'$  IV every 24 hours.  Goal trough 15-20 mcg/mL. Zosyn 3.375g IV q8h (4 hour infusion).  Monitor culture data, renal function and clinical course VT at SS prn  Height: '5\' 3"'$  (160 cm) Weight: 100 lb (45.4 kg) IBW/kg (Calculated) : 52.4  Temp (24hrs), Avg:97.4 F (36.3 C), Min:97.4 F (36.3 C), Max:97.4 F (36.3 C)   Recent Labs Lab 07/21/16 1140 07/21/16 1206  WBC 4.8  --   CREATININE  --  1.20*  LATICACIDVEN  --  3.37*    Estimated Creatinine Clearance: 36.6 mL/min (by C-G formula based on SCr of 1.2 mg/dL (H)).    Not on File   Andrey Cota. Diona Foley, PharmD, [place] Clinical Pharmacist Pager [phone] 07/21/2016 12:20 PM

## 2016-07-21 NOTE — Progress Notes (Signed)
  Echocardiogram 2D Echocardiogram has been performed.  Brittany [name] 07/21/2016, 4:35 PM

## 2016-07-21 NOTE — Progress Notes (Signed)
[facility] Progress Note Patient Name: Brittany [name] DOB: 1956/02/08 MRN: [id number]   Date of Service  07/21/2016  HPI/Events of Note  Shock, CVP 12-13 Resting comfortably  eICU Interventions  kvo fluids Advance diet Consider [place] 07/21/2016, 6:29 PM

## 2016-07-21 NOTE — ED Notes (Signed)
Placed pt on bedpan to collect urine specimen; pt unable to provide on at this time

## 2016-07-21 NOTE — ED Notes (Signed)
Medical History & Med List being faxed by Claudia [facility]

## 2016-07-21 NOTE — Progress Notes (Signed)
eLink Physician-Brief Progress Note Patient Name: Brittany [name] DOB: 1956-03-17 MRN: [id number]   Date of Service  07/21/2016  HPI/Events of Note  New patient evaluation Admitted with hypotension today Administered IV Fluids with no benefit Echo showed normal LVEF but elevated RVSP No WBC elevation, no fever No clear sign of infection Empiric antibiotics Currently comfortable on room air  eICU Interventions  Continue neosynephrine Have asked RN to check CVP May want to consider [place]     Intervention Category Evaluation Type: New Patient Evaluation  Simonne Maffucci 07/21/2016, 5:56 PM

## 2016-07-22 ENCOUNTER — Inpatient Hospital Stay (HOSPITAL_COMMUNITY): Payer: Medicare (Managed Care)

## 2016-07-22 DIAGNOSIS — N179 Acute kidney failure, unspecified: Secondary | ICD-10-CM

## 2016-07-22 DIAGNOSIS — I481 Persistent atrial fibrillation: Secondary | ICD-10-CM

## 2016-07-22 DIAGNOSIS — R570 Cardiogenic shock: Principal | ICD-10-CM

## 2016-07-22 LAB — CBC
HEMATOCRIT: 29.3 % — AB (ref 36.0–46.0)
Hemoglobin: 9.5 g/dL — ABNORMAL LOW (ref 12.0–15.0)
MCH: 26.8 pg (ref 26.0–34.0)
MCHC: 32.4 g/dL (ref 30.0–36.0)
MCV: 82.8 fL (ref 78.0–100.0)
Platelets: 169 10*3/uL (ref 150–400)
RBC: 3.54 MIL/uL — AB (ref 3.87–5.11)
RDW: 14 % (ref 11.5–15.5)
WBC: 3.7 10*3/uL — ABNORMAL LOW (ref 4.0–10.5)

## 2016-07-22 LAB — BASIC METABOLIC PANEL
ANION GAP: 7 (ref 5–15)
BUN: 24 mg/dL — AB (ref 6–20)
CO2: 21 mmol/L — AB (ref 22–32)
Calcium: 7.9 mg/dL — ABNORMAL LOW (ref 8.9–10.3)
Chloride: 111 mmol/L (ref 101–111)
Creatinine, Ser: 1.39 mg/dL — ABNORMAL HIGH (ref 0.44–1.00)
GFR calc Af Amer: 47 mL/min — ABNORMAL LOW (ref >60)
GFR calc non Af Amer: 41 mL/min — ABNORMAL LOW (ref >60)
GLUCOSE: 86 mg/dL (ref 65–99)
POTASSIUM: 4.8 mmol/L (ref 3.5–5.1)
Sodium: 139 mmol/L (ref 135–145)

## 2016-07-22 LAB — PROCALCITONIN

## 2016-07-22 LAB — TROPONIN I: Troponin I: 0.07 ng/mL (ref <0.03)

## 2016-07-22 LAB — MAGNESIUM: Magnesium: 1.6 mg/dL — ABNORMAL LOW (ref 1.7–2.4)

## 2016-07-22 MED ORDER — LAMOTRIGINE 25 MG PO TABS
100.0000 mg | ORAL_TABLET | Freq: Two times a day (BID) | ORAL | Status: DC
  Administered 2016-07-22 – 2016-07-26 (×8): 100 mg via ORAL
  Filled 2016-07-22 (×7): qty 4

## 2016-07-22 MED ORDER — DONEPEZIL HCL 10 MG PO TABS
10.0000 mg | ORAL_TABLET | Freq: Every day | ORAL | Status: DC
  Administered 2016-07-22 – 2016-07-25 (×4): 10 mg via ORAL
  Filled 2016-07-22 (×4): qty 1

## 2016-07-22 MED ORDER — MEMANTINE HCL 10 MG PO TABS
10.0000 mg | ORAL_TABLET | Freq: Two times a day (BID) | ORAL | Status: DC
  Administered 2016-07-22 – 2016-07-26 (×8): 10 mg via ORAL
  Filled 2016-07-22 (×8): qty 1

## 2016-07-22 MED ORDER — SODIUM CHLORIDE 0.9% FLUSH: 10.0000 mL | INTRAVENOUS | Status: DC | PRN

## 2016-07-22 MED ORDER — PANCRELIPASE (LIP-PROT-AMYL) 12000-38000 UNITS PO CPEP
24000.0000 [IU] | ORAL_CAPSULE | Freq: Three times a day (TID) | ORAL | Status: DC
  Administered 2016-07-23 – 2016-07-25 (×9): 24000 [IU] via ORAL
  Filled 2016-07-22 (×9): qty 2

## 2016-07-22 MED ORDER — FUROSEMIDE 10 MG/ML IJ SOLN
20.0000 mg | Freq: Once | INTRAMUSCULAR | Status: AC
  Administered 2016-07-22: 20 mg via INTRAVENOUS
  Filled 2016-07-22: qty 2

## 2016-07-22 MED ORDER — QUETIAPINE FUMARATE 25 MG PO TABS
12.5000 mg | ORAL_TABLET | Freq: Every evening | ORAL | Status: DC
  Administered 2016-07-22 – 2016-07-25 (×4): 12.5 mg via ORAL
  Filled 2016-07-22 (×5): qty 1

## 2016-07-22 MED ORDER — SODIUM CHLORIDE 0.9% FLUSH: 10.0000 mL | Freq: Two times a day (BID) | INTRAVENOUS | Status: DC

## 2016-07-22 NOTE — Progress Notes (Addendum)
Name: Daielle Melcher MRN: [id number] DOB: [birth year]    LOS: 1  PCCM ADMISSION NOTE  History of Present Illness: Ms. Maris, 61 y.o lady with past medical history significant for seizures  brought to ED after experiencing generalized weakness and dizziness. She is a patient at [facility] program, when she was getting ready to go there this morning she felt sudden generalized weakness and dizziness, associated with diaphoresis, which make her to sit down, according to son she never lost consciousness, he denies any seizure-like activity.the patient became cold and clammy.She denies any chest pain or palpitations.  According to her son she did had a couple of TIA in the past ,  no documented history available, patient and her son denied any history of heart problem. She denies any headache , but do endorse having some blurry vision during this event.she denies any focal weakness. No history of recent fever, upper respiratory symptoms, nausea, vomiting or diarrhea.No urinary complaints.    Lines / Drains: 1/5 IJ>>>  Cultures: Blood Culture>>  Antibiotics: Vanc. Zosyn  Tests / Events:   SUBJ - denies CP, dyspnea, or dizzines  Off pressors  Vital Signs: Temp:  [97.8 F (36.6 C)-99.1 F (37.3 C)] 98.9 F (37.2 C) (01/06 0800) Pulse Rate:  [62-120] 88 (01/06 0800) Resp:  [12-24] 24 (01/06 0900) BP: (59-116)/(44-100) 116/100 (01/06 0900) SpO2:  [34 %-100 %] 95 % (01/06 0800) Weight:  [106 lb 6.4 oz (48.3 kg)] 106 lb 6.4 oz (48.3 kg) (01/06 0500) I/O last 3 completed shifts: In: 3203 [P.O.:240; I.V.:252; IV Piggyback:2711] Out: 400 [Urine:400]  Physical Examination: General:  Chr ill, older than stated age, in no acute distress. Neuro: Alert and interactive, non focal HEENT:  PERRL, EOMI. no scleral icterus, Neck:  Supple, no JVD, no carotid bruit.  Cardiovascular:  Irregularly irregular. Lungs:  Clear bilaterally  Abdomen: Soft, nontender, nondistended, bowel sounds positive.   Musculoskeletal: No gross abnormality, no edema, perifrontal pulses present bilaterally. Skin:  Cold and moist.no rash noted.  Ventilator settings:    Labs and Imaging:  Reviewed.  Please refer to the Assessment and Plan section for relevant results.  DISCUSSION:   Ms. Bernet, 61 y.o lady with past medical history significant for seizures  brought to ED after experiencing generalized weakness and dizziness.She was hypotensive with BP of 60/42, tachycardic with lactic acidosis.  ECHO shows nml LV fn, mod MR, RVSP 53 ECG significant for atrial flutter,ST abnormalities and prolonged QTc.No previous ECG to compare.   ASSESSMENT / PLAN:  PULMONARY A: No acute issue. P:   Supplemental O2 as needed to keep SpO2 > 92% Monitor  CARDIOVASCULAR A:  Shock - r/o Cardiogenic vs chronic hypotension Pulm hypertension AF -she was taken off Xarelto, and continued only on ASA 325 given hx of falls.  Lactic acidosis - cleared  P:  Telemetry  Off Phenylephrine titrated for MAP goal IVFs dc  Dc Heparin infusion  Dc sotalol per cards Cardiology following   RENAL A:   AKI -Mildly Elevated Creatinine, Unknown baseline/CKD P:    Follow BMP  GASTROINTESTINAL A:   GI prophylaxis. FOB + P:    PPI Advance PO  HEMATOLOGIC A:   Anemia   P:  Follow CBC  INFECTIOUS A:   SIRS/sepsis - NOT clear   P:   Broad spectrum antibiotics Follow cultures Trend PCT & if low , can dc abx  ENDOCRINE A:   R/o rel AI  P:   Check Cortisol.  NEUROLOGIC A:   H/O  Seizure, last episode in 06/17. H/O TIA?CT head neg  P:   Continue home dose of Keppra and Phenytoin.   FAMILY  - Updates: patient   - Inter-disciplinary family meet or Palliative Care meeting due by:  1/12  Cc time x 58m Can transfer to tele & to [facility] 1/7  ALVA,RAKESH V. 07/22/2016, 10:59 AM

## 2016-07-22 NOTE — Progress Notes (Signed)
Pt belongings including dentures, earrings in labeled bottle, shoes and clothing transferred with pt to 3E 04.

## 2016-07-22 NOTE — Progress Notes (Signed)
Called Mr. Leotis Pain, pt son, updated son pt moved to [id number].

## 2016-07-22 NOTE — Progress Notes (Signed)
Patient Name: Brittany [name] Date of Encounter: 07/22/2016  Primary Cardiologist: new (Dr. Tamala Julian)  [facility] Problem List     Active Problems:   Cardiogenic shock [facility])   Syncope and collapse   Encounter for central line care   Hypotension   AKI (acute kidney injury) ([place])     Subjective   Reports feeling much better, back to normal. In atrial fibrillation with fair rate control (90-100). BP improved. Conversant and fairly coherent, but disoriented - she is worried that she will miss New Year's Eve party if she is here the whole weekend. ECG shows coarse AFib. QTc better 499 ms  Inpatient Medications    Scheduled Meds: . aspirin  81 mg Oral Daily  . furosemide  20 mg Intravenous Once  . heparin subcutaneous  5,000 Units Subcutaneous Q8H  . levETIRAcetam  750 mg Oral BID  . piperacillin-tazobactam (ZOSYN)  IV  3.375 g Intravenous Q8H  . vancomycin  750 mg Intravenous Q24H   Continuous Infusions:  PRN Meds: sodium chloride   Vital Signs    Vitals:   07/22/16 0639 07/22/16 0700 07/22/16 0800 07/22/16 0900  BP: 97/77 96/75 105/87 (!) 116/100  Pulse:  86 88   Resp: '20 20 12 '$ (!) 24  Temp:   98.9 F (37.2 C)   TempSrc:   Oral   SpO2:  100% 95%   Weight:      Height:        Intake/Output Summary (Last 24 hours) at 07/22/16 1113 Last data filed at 07/22/16 0900  Gross per 24 hour  Intake             3223 ml  Output              550 ml  Net             2673 ml   Filed Weights   07/21/16 1026 07/22/16 0500  Weight: 100 lb (45.4 kg) 106 lb 6.4 oz (48.3 kg)    Physical Exam  Comfortable GEN: Cachectic, in no acute distress.  HEENT: Grossly normal.  Neck: Supple, no JVD, carotid bruits, or masses. Cardiac: irregular, no murmurs, rubs, or gallops. No clubbing, cyanosis, edema.  Radials/DP/PT 2+ and equal bilaterally.  Respiratory:  Respirations regular and unlabored, clear to auscultation bilaterally. GI: Soft, nontender, nondistended, BS + x 4. MS: no  deformity or atrophy. Skin: warm and dry, no rash. Neuro:  Strength and sensation are intact. Psych: AAOx2.  Normal affect.  Labs    CBC  Recent Labs  07/21/16 1140 07/21/16 1206 07/22/16 0450  WBC 4.8  --  3.7*  NEUTROABS 3.0  --   --   HGB 11.0* 11.2* 9.5*  HCT 34.8* 33.0* 29.3*  MCV 84.3  --  82.8  PLT 153  --  295   Basic Metabolic Panel  Recent Labs  07/21/16 1140 07/21/16 1206 07/22/16 0450  NA 139 140 139  K 4.7 4.7 4.8  CL 108 105 111  CO2 22  --  21*  GLUCOSE 94 90 86  BUN 19 22* 24*  CREATININE 1.18* 1.20* 1.39*  CALCIUM 8.8*  --  7.9*  MG  --   --  1.6*   Liver Function Tests  Recent Labs  07/21/16 1140  AST 46*  ALT 33  ALKPHOS 83  BILITOT 0.6  PROT 6.6  ALBUMIN 3.9    Recent Labs  07/21/16 1140  LIPASE 12   Cardiac Enzymes  Recent Labs  07/21/16  1746 07/21/16 2140 07/22/16 0450  TROPONINI <0.03 <0.03 0.07*     Telemetry    Afib with fair ventricular rate control, 90s most of the time - Personally Reviewed  ECG    Coarse AFib, lateral ST-T changes, QTc 499 ms. - Personally Reviewed  Radiology    Ct Head Wo Contrast  Result Date: 07/21/2016 CLINICAL DATA:  Syncope, hypotensive and septic.  No headache. EXAM: CT HEAD WITHOUT CONTRAST TECHNIQUE: Contiguous axial images were obtained from the base of the skull through the vertex without intravenous contrast. COMPARISON:  None. FINDINGS: BRAIN: There is sulcal and ventricular prominence consistent with superficial and central atrophy. No intraparenchymal hemorrhage, mass effect nor midline shift. Mild degree of periventricular and subcortical white matter hypodensities consistent with chronic small vessel ischemic disease are identified. No acute large vascular territory infarcts. No abnormal extra-axial fluid collections. Basal cisterns are not effaced and midline. Normal variant cavum septum pellucidum and vergae. VASCULAR: Moderate calcific atherosclerosis of the carotid siphons.  SKULL: No skull fracture. No significant scalp soft tissue swelling. SINUSES/ORBITS: The mastoid air-cells are clear. The included paranasal sinuses are well-aerated.The included ocular globes and orbital contents are non-suspicious. OTHER: None. IMPRESSION: Likely chronic small vessel ischemic disease of periventricular white matter with cerebral atrophy. Electronically Signed   By: Ashley Royalty M.D.   On: 07/21/2016 17:20   Dg Chest Port 1 View  Result Date: 07/22/2016 CLINICAL DATA:  Pt states she "originally came to the hospital because she passed out and thought she was having a stroke." No current chest complaints. RN states Dr Lu Duffel be wanting to evaluate fluid levels. EXAM: PORTABLE CHEST 1 VIEW COMPARISON:  07/21/2016. FINDINGS: Mild hazy airspace opacity developed in the right mid to lower lung. Mild interstitial thickening in the lower lungs is stable. Lungs are hyperexpanded. Cardiac silhouette is mildly enlarged. Left internal jugular central venous line is stable. No pneumothorax.  No definite pleural effusion. IMPRESSION: 1. Mild hazy airspace opacity has developed in the right mid to lower lung. This may be from layering pleural fluid. Mild airspace edema is possible. 2. No other change. Mild persistent lower lung interstitial thickening. Overall, findings again suggests mild congestive heart failure. Electronically Signed   By: Lajean Manes M.D.   On: 07/22/2016 07:59   Dg Chest Port 1 View  Result Date: 07/21/2016 CLINICAL DATA:  Central line placement EXAM: PORTABLE CHEST 1 VIEW COMPARISON:  07/21/2016 FINDINGS: There is a left jugular central venous catheter with the tip projecting over the SVC. There is bilateral diffuse interstitial thickening. There is a trace right pleural effusion. There is no left pleural effusion. There is no pneumothorax. There is mild stable cardiomegaly. The osseous structures are unremarkable. IMPRESSION: Mild CHF. Left jugular central venous catheter with the tip  projecting over the SVC. Electronically Signed   By: Kathreen Devoid   On: 07/21/2016 15:12   Dg Chest Port 1 View  Result Date: 07/21/2016 CLINICAL DATA:  Syncope.  Chest pain.  Shortness of breath. EXAM: PORTABLE CHEST 1 VIEW COMPARISON:  None. FINDINGS: There is borderline cardiomegaly. Pulmonary vascularity is normal and the lungs are clear. No acute bone abnormality. Old nonunion fracture of the distal right clavicle. IMPRESSION: Borderline cardiomegaly.  No other significant abnormalities. Electronically Signed   By: Lorriane Shire M.D.   On: 07/21/2016 11:23    Cardiac Studies   - Left ventricle: The cavity size was normal. Wall thickness was   increased in a pattern of severe LVH. Systolic function  was   normal. The estimated ejection fraction was in the range of 50%   to 55%. The study is not technically sufficient to allow   evaluation of LV diastolic function. - Mitral valve: There was moderate regurgitation. - Left atrium: The atrium was moderately dilated. - Right atrium: The atrium was moderately dilated. - Atrial septum: No defect or patent foramen ovale was identified. - Pulmonary arteries: PA peak pressure: 53 mm Hg (S).  Patient Profile     61 yo female with dementia, seizures, CHF, AF, HTN, CVA, CKD III, COPD, chronic alcoholism and PAD who presented to the ED with c/o generalized weakness, dizziness, and diaphoresis.    Assessment & Plan    1. Hypotension:  In review of records it appears that her baseline BPs run in the low 90s, she is not on any antihypertensive agents. Near-normal LVEF.  2. Hx of AF: Seems this is a hx of PAF. Notes from her PCP show that she was taken off Xarelto, and continued only on ASA 325 given hx of falls. Agree with DC sotalol. If necessary, can use low dose po amiodarone for rate control, but currently has a fairly acceptable ventricular rate off meds.  3. Elevated Lactic Acid: Currently being treated with antibiotics. CXR clear, UA  abnormal.   4. CKD III: Cr 1.2 on admission. Baseline?  5. CHF: preserved EF, diastolic dysfunction probably present due to LVH, but harder to assess due to atrial arrhythmia.. By clinical exam, she does not appear to be volume overloaded at this time. Her BNP is very high, but we have no baseline for comparison. CXR suggests mild CHF. I would not give her any more IV fluids.   Signed, Sanda Klein, MD  07/22/2016, 11:13 AM

## 2016-07-23 DIAGNOSIS — I9589 Other hypotension: Secondary | ICD-10-CM

## 2016-07-23 LAB — CBC
HCT: 29.9 % — ABNORMAL LOW (ref 36.0–46.0)
Hemoglobin: 9.7 g/dL — ABNORMAL LOW (ref 12.0–15.0)
MCH: 26.6 pg (ref 26.0–34.0)
MCHC: 32.4 g/dL (ref 30.0–36.0)
MCV: 81.9 fL (ref 78.0–100.0)
Platelets: 158 10*3/uL (ref 150–400)
RBC: 3.65 MIL/uL — ABNORMAL LOW (ref 3.87–5.11)
RDW: 13.7 % (ref 11.5–15.5)
WBC: 4 10*3/uL (ref 4.0–10.5)

## 2016-07-23 LAB — BASIC METABOLIC PANEL
Anion gap: 7 (ref 5–15)
BUN: 22 mg/dL — AB (ref 6–20)
CALCIUM: 8.9 mg/dL (ref 8.9–10.3)
CO2: 24 mmol/L (ref 22–32)
CREATININE: 1.31 mg/dL — AB (ref 0.44–1.00)
Chloride: 108 mmol/L (ref 101–111)
GFR calc non Af Amer: 44 mL/min — ABNORMAL LOW (ref >60)
GFR, EST AFRICAN AMERICAN: 51 mL/min — AB (ref >60)
Glucose, Bld: 101 mg/dL — ABNORMAL HIGH (ref 65–99)
Potassium: 3.8 mmol/L (ref 3.5–5.1)
SODIUM: 139 mmol/L (ref 135–145)

## 2016-07-23 LAB — URINE CULTURE: CULTURE: NO GROWTH

## 2016-07-23 LAB — C DIFFICILE QUICK SCREEN W PCR REFLEX
C DIFFICILE (CDIFF) TOXIN: NEGATIVE
C DIFFICLE (CDIFF) ANTIGEN: NEGATIVE
C Diff interpretation: NOT DETECTED

## 2016-07-23 LAB — PROCALCITONIN

## 2016-07-23 MED ORDER — AMIODARONE HCL 200 MG PO TABS
400.0000 mg | ORAL_TABLET | Freq: Two times a day (BID) | ORAL | Status: DC
  Administered 2016-07-23 – 2016-07-24 (×2): 400 mg via ORAL
  Filled 2016-07-23 (×2): qty 2

## 2016-07-23 MED ORDER — LOPERAMIDE HCL 2 MG PO CAPS
4.0000 mg | ORAL_CAPSULE | Freq: Once | ORAL | Status: AC
  Administered 2016-07-23: 4 mg via ORAL
  Filled 2016-07-23: qty 2

## 2016-07-23 MED ORDER — AMIODARONE IV BOLUS ONLY 150 MG/100ML
150.0000 mg | Freq: Once | INTRAVENOUS | Status: AC
  Administered 2016-07-23: 150 mg via INTRAVENOUS
  Filled 2016-07-23: qty 100

## 2016-07-23 MED ORDER — AMIODARONE HCL 200 MG PO TABS
200.0000 mg | ORAL_TABLET | Freq: Two times a day (BID) | ORAL | Status: DC
  Administered 2016-07-23: 200 mg via ORAL
  Filled 2016-07-23: qty 1

## 2016-07-23 NOTE — Progress Notes (Signed)
PAtient continues to have small frequent loose stools, notified MD, orders received.

## 2016-07-23 NOTE — Progress Notes (Signed)
Notified [place], PA regarding patients heart rate elevations as high as in the 140's.  Orders received for bolus Amiodarone IV along with po.  Will continue to monitor.

## 2016-07-23 NOTE — Progress Notes (Signed)
C diff noted to be negative, MD notified, one time order for Imodium received.  Patients daughter at bedside.

## 2016-07-23 NOTE — Progress Notes (Signed)
PROGRESS NOTE    Brittany [name]  JIR:[id number] DOB: [birth year] DOA: 07/21/2016 PCP: No PCP Per Patient    Brief Narrative:  Brittany [name], 61 y.o lady with past medical history significant for seizures  brought to ED after experiencing generalized weakness and dizziness. She is a patient at [facility] program, when she was getting ready to go there this morning she felt sudden generalized weakness and dizziness, associated with diaphoresis, which make her to sit down, according to son she never lost consciousness, he denies any seizure-like activity.the patient became cold and clammy.She denies any chest pain or palpitations.   She was hypotensive with BP of 60/42, tachycardic with lactic acidosis. No leukocytosis or fever. 3L of bolus were unable to bring her BP up. Central line was placed for pressors. Her shock is more consistent with cardiogenic in origin. She was admitted to ICU for shock and pressor needs.  Assessment & Plan:   Active Problems:   Cardiogenic shock (HCC)   Syncope and collapse   Encounter for central line care   Hypotension   AKI (acute kidney injury) ([place])  1-Shock, Hypotension;  Off pressors.  Cortisol; 32 Blood pressure improved.   Diarrhea;  C dif negative.  Stop IV antibiotics.   AKI;  Renal function improving.   Sepsis; rule out.  Blood culture, urine culture no growth.  pro calcitonin less than 0.10 Will observed off antibiotics.   History of seizure;  continue with Lamictal and keppra.   Chronic Diastolic HF; compensated.  Cardiology following.   A fib; on amiodarone.  Thought to be high risk for bleeding and falls.     DVT prophylaxis: Heparin  Code Status: Full code.  Family Communication: none at bedside.  Disposition Plan: remain inpatient.   Consultants:   Cardiology    Procedures:  ECHO; - Left ventricle: The cavity size was normal. Wall thickness was increased in a pattern of severe LVH. Systolic function was   normal. The  estimated ejection fraction was in the range of 50% to 55%. The study is not technically sufficient to allow evaluation of LV diastolic function.- Mitral valve: There was moderate regurgitation.- Left atrium: The atrium was moderately dilated. - Right atrium: The atrium was moderately dilated. - Atrial septum: No defect or patent foramen ovale was identified. - Pulmonary arteries: PA peak pressure: 53 mm Hg (S).    Antimicrobials:   Vancomycin 1-05  Zosyn 1-05   Subjective: Feeling well, very pleasant.  Report diarrhea, watery, more than 4 BM in the day . Started in the hospital   Objective: Vitals:   07/22/16 2200 07/23/16 0109 07/23/16 0554 07/23/16 0556  BP: 122/73 111/65  106/75  Pulse: 79 (!) 114  98  Resp: 18   16  Temp: 99.1 F (37.3 C) 98.5 F (36.9 C)  98.8 F (37.1 C)  TempSrc: Oral Oral  Oral  SpO2: 97% 98%  99%  Weight:   45.6 kg (100 lb 8 oz)   Height:        Intake/Output Summary (Last 24 hours) at 07/23/16 0745 Last data filed at 07/23/16 0600  Gross per 24 hour  Intake             1110 ml  Output             2050 ml  Net             -940 ml   Filed Weights   07/22/16 0500 07/22/16 1746 07/23/16 0554  Weight: 48.3  kg (106 lb 6.4 oz) 45.9 kg (101 lb 4.8 oz) 45.6 kg (100 lb 8 oz)    Examination:  General exam: Appears calm and comfortable  Respiratory system: Clear to auscultation. Respiratory effort normal. Cardiovascular system: S1 & S2 heard, RRR. No JVD, murmurs, rubs, gallops or clicks. No pedal edema. Gastrointestinal system: Abdomen is nondistended, soft and nontender. No organomegaly or masses felt. Normal bowel sounds heard. Central nervous system: Alert and oriented. No focal neurological deficits. Extremities: Symmetric 5 x 5 power. Skin: No rashes, lesions or ulcers Psychiatry: Judgement and insight appear normal. Mood & affect appropriate.     Data Reviewed: I have personally reviewed following labs and imaging  studies  CBC:  Recent Labs Lab 07/21/16 1140 07/21/16 1206 07/22/16 0450 07/23/16 0245  WBC 4.8  --  3.7* 4.0  NEUTROABS 3.0  --   --   --   HGB 11.0* 11.2* 9.5* 9.7*  HCT 34.8* 33.0* 29.3* 29.9*  MCV 84.3  --  82.8 81.9  PLT 153  --  169 937   Basic Metabolic Panel:  Recent Labs Lab 07/21/16 1140 07/21/16 1206 07/22/16 0450 07/23/16 0245  NA 139 140 139 139  K 4.7 4.7 4.8 3.8  CL 108 105 111 108  CO2 22  --  21* 24  GLUCOSE 94 90 86 101*  BUN 19 22* 24* 22*  CREATININE 1.18* 1.20* 1.39* 1.31*  CALCIUM 8.8*  --  7.9* 8.9  MG  --   --  1.6*  --    GFR: Estimated Creatinine Clearance: 33.7 mL/min (by C-G formula based on SCr of 1.31 mg/dL (H)). Liver Function Tests:  Recent Labs Lab 07/21/16 1140  AST 46*  ALT 33  ALKPHOS 83  BILITOT 0.6  PROT 6.6  ALBUMIN 3.9    Recent Labs Lab 07/21/16 1140  LIPASE 12   No results for input(s): AMMONIA in the last 168 hours. Coagulation Profile:  Recent Labs Lab 07/21/16 1140  INR 1.19   Cardiac Enzymes:  Recent Labs Lab 07/21/16 1746 07/21/16 2140 07/22/16 0450  TROPONINI <0.03 <0.03 0.07*   BNP (last 3 results) No results for input(s): PROBNP in the last 8760 hours. HbA1C: No results for input(s): HGBA1C in the last 72 hours. CBG: No results for input(s): GLUCAP in the last 168 hours. Lipid Profile: No results for input(s): CHOL, HDL, LDLCALC, TRIG, CHOLHDL, LDLDIRECT in the last 72 hours. Thyroid Function Tests: No results for input(s): TSH, T4TOTAL, FREET4, T3FREE, THYROIDAB in the last 72 hours. Anemia Panel: No results for input(s): VITAMINB12, FOLATE, FERRITIN, TIBC, IRON, RETICCTPCT in the last 72 hours. Sepsis Labs:  Recent Labs Lab 07/21/16 1206 07/21/16 1715 07/22/16 0450  PROCALCITON  --   --  <0.10  LATICACIDVEN 3.37* 1.43  --     Recent Results (from the past 240 hour(s))  Culture, blood (routine x 2)     Status: None (Preliminary result)   Collection Time: 07/21/16 11:50  AM  Result Value Ref Range Status   Specimen Description BLOOD RIGHT HAND  Final   Special Requests IN PEDIATRIC BOTTLE  3CC  Final   Culture NO GROWTH < 24 HOURS  Final   Report Status PENDING  Incomplete  Culture, blood (routine x 2)     Status: None (Preliminary result)   Collection Time: 07/21/16 11:55 AM  Result Value Ref Range Status   Specimen Description RIGHT ANTECUBITAL  Final   Special Requests IN PEDIATRIC BOTTLE  3CC  Final  Culture NO GROWTH < 24 HOURS  Final   Report Status PENDING  Incomplete  MRSA PCR Screening     Status: None   Collection Time: 07/21/16  6:01 PM  Result Value Ref Range Status   MRSA by PCR NEGATIVE NEGATIVE Final    Comment:        The GeneXpert MRSA Assay (FDA approved for NASAL specimens only), is one component of a comprehensive MRSA colonization surveillance program. It is not intended to diagnose MRSA infection nor to guide or monitor treatment for MRSA infections.          Radiology Studies: Ct Head Wo Contrast  Result Date: 07/21/2016 CLINICAL DATA:  Syncope, hypotensive and septic.  No headache. EXAM: CT HEAD WITHOUT CONTRAST TECHNIQUE: Contiguous axial images were obtained from the base of the skull through the vertex without intravenous contrast. COMPARISON:  None. FINDINGS: BRAIN: There is sulcal and ventricular prominence consistent with superficial and central atrophy. No intraparenchymal hemorrhage, mass effect nor midline shift. Mild degree of periventricular and subcortical white matter hypodensities consistent with chronic small vessel ischemic disease are identified. No acute large vascular territory infarcts. No abnormal extra-axial fluid collections. Basal cisterns are not effaced and midline. Normal variant cavum septum pellucidum and vergae. VASCULAR: Moderate calcific atherosclerosis of the carotid siphons. SKULL: No skull fracture. No significant scalp soft tissue swelling. SINUSES/ORBITS: The mastoid air-cells are  clear. The included paranasal sinuses are well-aerated.The included ocular globes and orbital contents are non-suspicious. OTHER: None. IMPRESSION: Likely chronic small vessel ischemic disease of periventricular white matter with cerebral atrophy. Electronically Signed   By: Ashley Royalty M.D.   On: 07/21/2016 17:20   Dg Chest Port 1 View  Result Date: 07/22/2016 CLINICAL DATA:  Pt states she "originally came to the hospital because she passed out and thought she was having a stroke." No current chest complaints. RN states Dr Lu Duffel be wanting to evaluate fluid levels. EXAM: PORTABLE CHEST 1 VIEW COMPARISON:  07/21/2016. FINDINGS: Mild hazy airspace opacity developed in the right mid to lower lung. Mild interstitial thickening in the lower lungs is stable. Lungs are hyperexpanded. Cardiac silhouette is mildly enlarged. Left internal jugular central venous line is stable. No pneumothorax.  No definite pleural effusion. IMPRESSION: 1. Mild hazy airspace opacity has developed in the right mid to lower lung. This may be from layering pleural fluid. Mild airspace edema is possible. 2. No other change. Mild persistent lower lung interstitial thickening. Overall, findings again suggests mild congestive heart failure. Electronically Signed   By: Lajean Manes M.D.   On: 07/22/2016 07:59   Dg Chest Port 1 View  Result Date: 07/21/2016 CLINICAL DATA:  Central line placement EXAM: PORTABLE CHEST 1 VIEW COMPARISON:  07/21/2016 FINDINGS: There is a left jugular central venous catheter with the tip projecting over the SVC. There is bilateral diffuse interstitial thickening. There is a trace right pleural effusion. There is no left pleural effusion. There is no pneumothorax. There is mild stable cardiomegaly. The osseous structures are unremarkable. IMPRESSION: Mild CHF. Left jugular central venous catheter with the tip projecting over the SVC. Electronically Signed   By: Kathreen Devoid   On: 07/21/2016 15:12   Dg Chest Port 1  View  Result Date: 07/21/2016 CLINICAL DATA:  Syncope.  Chest pain.  Shortness of breath. EXAM: PORTABLE CHEST 1 VIEW COMPARISON:  None. FINDINGS: There is borderline cardiomegaly. Pulmonary vascularity is normal and the lungs are clear. No acute bone abnormality. Old nonunion fracture of the distal  right clavicle. IMPRESSION: Borderline cardiomegaly.  No other significant abnormalities. Electronically Signed   By: Lorriane Shire M.D.   On: 07/21/2016 11:23        Scheduled Meds: . aspirin  81 mg Oral Daily  . donepezil  10 mg Oral QHS  . heparin subcutaneous  5,000 Units Subcutaneous Q8H  . lamoTRIgine  100 mg Oral BID  . levETIRAcetam  750 mg Oral BID  . lipase/protease/amylase  24,000 Units Oral TID WC  . memantine  10 mg Oral BID  . piperacillin-tazobactam (ZOSYN)  IV  3.375 g Intravenous Q8H  . QUEtiapine  12.5 mg Oral QPM  . vancomycin  750 mg Intravenous Q24H   Continuous Infusions:   LOS: 2 days    Time spent: 35 minutes.     Elmarie Shiley, MD [facility] Pager [phone]  If 7PM-7AM, please contact night-coverage [email or web address] Password [email or web address] 07/23/2016, 7:45 AM

## 2016-07-23 NOTE — Progress Notes (Signed)
Patient Name: Toya Palacios Date of Encounter: 07/23/2016  Primary Cardiologist: new (Dr. Tamala Julian)  [facility] Problem List     Active Problems:   Cardiogenic shock [facility])   Syncope and collapse   Encounter for central line care   Hypotension   AKI (acute kidney injury) ([place])     Subjective   Pt alert and oriented x 3; denies dyspnea or CP  Inpatient Medications    Scheduled Meds: . amiodarone  150 mg Intravenous Once  . amiodarone  200 mg Oral BID  . aspirin  81 mg Oral Daily  . donepezil  10 mg Oral QHS  . heparin subcutaneous  5,000 Units Subcutaneous Q8H  . lamoTRIgine  100 mg Oral BID  . levETIRAcetam  750 mg Oral BID  . lipase/protease/amylase  24,000 Units Oral TID WC  . memantine  10 mg Oral BID  . piperacillin-tazobactam (ZOSYN)  IV  3.375 g Intravenous Q8H  . QUEtiapine  12.5 mg Oral QPM  . vancomycin  750 mg Intravenous Q24H   Continuous Infusions:  PRN Meds: sodium chloride   Vital Signs    Vitals:   07/22/16 2200 07/23/16 0109 07/23/16 0554 07/23/16 0556  BP: 122/73 111/65  106/75  Pulse: 79 (!) 114  98  Resp: 18   16  Temp: 99.1 F (37.3 C) 98.5 F (36.9 C)  98.8 F (37.1 C)  TempSrc: Oral Oral  Oral  SpO2: 97% 98%  99%  Weight:   100 lb 8 oz (45.6 kg)   Height:        Intake/Output Summary (Last 24 hours) at 07/23/16 0927 Last data filed at 07/23/16 0600  Gross per 24 hour  Intake              850 ml  Output             1900 ml  Net            -1050 ml   Filed Weights   07/22/16 0500 07/22/16 1746 07/23/16 0554  Weight: 106 lb 6.4 oz (48.3 kg) 101 lb 4.8 oz (45.9 kg) 100 lb 8 oz (45.6 kg)    Physical Exam  Comfortable GEN: Frail, in no acute distress.  HEENT: Grossly normal.  Neck: Supple Cardiac: irregular, tachycardic Respiratory:  Minimal basilar crackles GI: Soft, nontender, nondistended. MS: no deformity or atrophy. Skin: warm and dry, no rash. Neuro:  Strength and sensation are intact. Psych: AAOx2.  Normal  affect.  Labs    CBC  Recent Labs  07/21/16 1140  07/22/16 0450 07/23/16 0245  WBC 4.8  --  3.7* 4.0  NEUTROABS 3.0  --   --   --   HGB 11.0*  < > 9.5* 9.7*  HCT 34.8*  < > 29.3* 29.9*  MCV 84.3  --  82.8 81.9  PLT 153  --  169 158  < > = values in this interval not displayed. Basic Metabolic Panel  Recent Labs  07/22/16 0450 07/23/16 0245  NA 139 139  K 4.8 3.8  CL 111 108  CO2 21* 24  GLUCOSE 86 101*  BUN 24* 22*  CREATININE 1.39* 1.31*  CALCIUM 7.9* 8.9  MG 1.6*  --    Liver Function Tests  Recent Labs  07/21/16 1140  AST 46*  ALT 33  ALKPHOS 83  BILITOT 0.6  PROT 6.6  ALBUMIN 3.9    Recent Labs  07/21/16 1140  LIPASE 12   Cardiac Enzymes  Recent Labs  07/21/16 1746 07/21/16 2140 07/22/16 0450  TROPONINI <0.03 <0.03 0.07*     Telemetry    Afib with elevated rate - Personally Reviewed   Radiology    Ct Head Wo Contrast  Result Date: 07/21/2016 CLINICAL DATA:  Syncope, hypotensive and septic.  No headache. EXAM: CT HEAD WITHOUT CONTRAST TECHNIQUE: Contiguous axial images were obtained from the base of the skull through the vertex without intravenous contrast. COMPARISON:  None. FINDINGS: BRAIN: There is sulcal and ventricular prominence consistent with superficial and central atrophy. No intraparenchymal hemorrhage, mass effect nor midline shift. Mild degree of periventricular and subcortical white matter hypodensities consistent with chronic small vessel ischemic disease are identified. No acute large vascular territory infarcts. No abnormal extra-axial fluid collections. Basal cisterns are not effaced and midline. Normal variant cavum septum pellucidum and vergae. VASCULAR: Moderate calcific atherosclerosis of the carotid siphons. SKULL: No skull fracture. No significant scalp soft tissue swelling. SINUSES/ORBITS: The mastoid air-cells are clear. The included paranasal sinuses are well-aerated.The included ocular globes and orbital contents  are non-suspicious. OTHER: None. IMPRESSION: Likely chronic small vessel ischemic disease of periventricular white matter with cerebral atrophy. Electronically Signed   By: Ashley Royalty M.D.   On: 07/21/2016 17:20   Dg Chest Port 1 View  Result Date: 07/22/2016 CLINICAL DATA:  Pt states she "originally came to the hospital because she passed out and thought she was having a stroke." No current chest complaints. RN states Dr Lu Duffel be wanting to evaluate fluid levels. EXAM: PORTABLE CHEST 1 VIEW COMPARISON:  07/21/2016. FINDINGS: Mild hazy airspace opacity developed in the right mid to lower lung. Mild interstitial thickening in the lower lungs is stable. Lungs are hyperexpanded. Cardiac silhouette is mildly enlarged. Left internal jugular central venous line is stable. No pneumothorax.  No definite pleural effusion. IMPRESSION: 1. Mild hazy airspace opacity has developed in the right mid to lower lung. This may be from layering pleural fluid. Mild airspace edema is possible. 2. No other change. Mild persistent lower lung interstitial thickening. Overall, findings again suggests mild congestive heart failure. Electronically Signed   By: Lajean Manes M.D.   On: 07/22/2016 07:59   Dg Chest Port 1 View  Result Date: 07/21/2016 CLINICAL DATA:  Central line placement EXAM: PORTABLE CHEST 1 VIEW COMPARISON:  07/21/2016 FINDINGS: There is a left jugular central venous catheter with the tip projecting over the SVC. There is bilateral diffuse interstitial thickening. There is a trace right pleural effusion. There is no left pleural effusion. There is no pneumothorax. There is mild stable cardiomegaly. The osseous structures are unremarkable. IMPRESSION: Mild CHF. Left jugular central venous catheter with the tip projecting over the SVC. Electronically Signed   By: Kathreen Devoid   On: 07/21/2016 15:12   Dg Chest Port 1 View  Result Date: 07/21/2016 CLINICAL DATA:  Syncope.  Chest pain.  Shortness of breath. EXAM: PORTABLE  CHEST 1 VIEW COMPARISON:  None. FINDINGS: There is borderline cardiomegaly. Pulmonary vascularity is normal and the lungs are clear. No acute bone abnormality. Old nonunion fracture of the distal right clavicle. IMPRESSION: Borderline cardiomegaly.  No other significant abnormalities. Electronically Signed   By: Lorriane Shire M.D.   On: 07/21/2016 11:23    Cardiac Studies   - Left ventricle: The cavity size was normal. Wall thickness was   increased in a pattern of severe LVH. Systolic function was   normal. The estimated ejection fraction was in the range of 50%   to 55%. The study is  not technically sufficient to allow   evaluation of LV diastolic function. - Mitral valve: There was moderate regurgitation. - Left atrium: The atrium was moderately dilated. - Right atrium: The atrium was moderately dilated. - Atrial septum: No defect or patent foramen ovale was identified. - Pulmonary arteries: PA peak pressure: 53 mm Hg (S).  Patient Profile     61 yo female with dementia, seizures, CHF, AF, HTN, CVA, CKD III, COPD, chronic alcoholism and PAD who presented to the ED with c/o generalized weakness, dizziness, and diaphoresis.    Assessment & Plan    1. Hypotension:  In review of records it appears that her baseline BPs run in the low 90s; she is not on any antihypertensive agents. Near-normal LVEF. BP improved. Would not hydrate further.  2. Hx of AF: CHADSvasc 6. Notes from her PCP show that she was taken off Xarelto, and continued only on ASA 325 given hx of falls. I agree with this. HR elevated; agree with initiating amiodarone as BP will not allow other AV nodal blocking agents.  3. CKD III: Follow renal function  5. Chronic diastolic CHF: Euvolemic; would not hydrate further.   Signed, Kirk Ruths, MD  07/23/2016, 9:27 AM

## 2016-07-24 DIAGNOSIS — I951 Orthostatic hypotension: Secondary | ICD-10-CM

## 2016-07-24 DIAGNOSIS — I4891 Unspecified atrial fibrillation: Secondary | ICD-10-CM

## 2016-07-24 LAB — PROCALCITONIN: Procalcitonin: <0.1 ng/mL

## 2016-07-24 LAB — TSH: TSH: 2.24 u[IU]/mL (ref 0.350–4.500)

## 2016-07-24 LAB — T4, FREE: Free T4: 1.24 ng/dL — ABNORMAL HIGH (ref 0.61–1.12)

## 2016-07-24 MED ORDER — AMIODARONE HCL 200 MG PO TABS
400.0000 mg | ORAL_TABLET | Freq: Three times a day (TID) | ORAL | Status: DC
  Administered 2016-07-24 – 2016-07-26 (×6): 400 mg via ORAL
  Filled 2016-07-24 (×6): qty 2

## 2016-07-24 NOTE — Progress Notes (Signed)
Patient with no complaints during 7 a to 7 p shift.  Daughter states her frequent stools are chronic however these occurred only 1 or 2 times during this shift and not after O&P was ordered.    Patients heart rate in 140's at times, when nurse went to room patient was up walking around and even dancing.  When sitting or lying down in bed patients heart rate noted to be 90's to low 100's.  Amiodarone dosage changed today.

## 2016-07-24 NOTE — Progress Notes (Signed)
PROGRESS NOTE    Brittany [name]  JJO:[id number] DOB: 1955/11/25 DOA: 07/21/2016 PCP: No PCP Per Patient    Brief Narrative:  Ms. Lio, 61 y.o lady with past medical history significant for seizures  brought to ED after experiencing generalized weakness and dizziness. She is a patient at [facility] program, when she was getting ready to go there this morning she felt sudden generalized weakness and dizziness, associated with diaphoresis, which make her to sit down, according to son she never lost consciousness, he denies any seizure-like activity.the patient became cold and clammy.She denies any chest pain or palpitations.   She was hypotensive with BP of 60/42, tachycardic with lactic acidosis. No leukocytosis or fever. 3L of bolus were unable to bring her BP up. Central line was placed for pressors. Her shock is more consistent with cardiogenic in origin. She was admitted to ICU for shock and pressor needs.  Assessment & Plan:   Active Problems:   Cardiogenic shock (HCC)   Syncope and collapse   Encounter for central line care   Hypotension   AKI (acute kidney injury) ([place])  1-Shock, Hypotension;  Off pressors.  Cortisol; 32 Blood pressure improved.   Diarrhea;  C dif negative.  Stop IV antibiotics.  Chronic diarrhea per daughter.  Will check ova and parasite.  Follow up outpatient with GI.  Lactose free diet  \ AKI;  Renal function improving.   Sepsis; rule out.  Blood culture, urine culture no growth.  pro calcitonin less than 0.10 Will observed off antibiotics.   History of seizure;  continue with Lamictal and keppra.   Chronic Diastolic HF; compensated.  Cardiology following.   A fib; on amiodarone.  Thought to be high risk for bleeding and falls.  HR has been elevated. Cardiology adjusting medication.   DVT prophylaxis: Heparin  Code Status: Full code.  Family Communication: none at bedside.  Disposition Plan: remain inpatient. Home when ok by cardiology    Consultants:   Cardiology    Procedures:  ECHO; - Left ventricle: The cavity size was normal. Wall thickness was increased in a pattern of severe LVH. Systolic function was   normal. The estimated ejection fraction was in the range of 50% to 55%. The study is not technically sufficient to allow evaluation of LV diastolic function.- Mitral valve: There was moderate regurgitation.- Left atrium: The atrium was moderately dilated. - Right atrium: The atrium was moderately dilated. - Atrial septum: No defect or patent foramen ovale was identified. - Pulmonary arteries: PA peak pressure: 53 mm Hg (S).    Antimicrobials:   Vancomycin 1-05  Zosyn 1-05   Subjective: Feeling ok, no new complaints.    Objective: Vitals:   07/23/16 1300 07/23/16 2331 07/24/16 0719 07/24/16 0812  BP: 108/77 100/66 114/76 116/85  Pulse: 92 90 92 95  Resp: '18 18 18 18  '$ Temp: 98.7 F (37.1 C) 97.8 F (36.6 C) 97.9 F (36.6 C) 98 F (36.7 C)  TempSrc: Oral Oral Oral Oral  SpO2: 100% 97% 98% 99%  Weight:   46.3 kg (102 lb 1.6 oz)   Height:        Intake/Output Summary (Last 24 hours) at 07/24/16 1020 Last data filed at 07/24/16 0924  Gross per 24 hour  Intake              840 ml  Output              801 ml  Net  39 ml   Filed Weights   07/22/16 1746 07/23/16 0554 07/24/16 0719  Weight: 45.9 kg (101 lb 4.8 oz) 45.6 kg (100 lb 8 oz) 46.3 kg (102 lb 1.6 oz)    Examination:  General exam: Appears calm and comfortable  Respiratory system: Clear to auscultation. Respiratory effort normal. Cardiovascular system: S1 & S2 heard, RRR. No JVD, murmurs, rubs, gallops or clicks. No pedal edema. Gastrointestinal system: Abdomen is nondistended, soft and nontender. No organomegaly or masses felt. Normal bowel sounds heard. Central nervous system: Alert and oriented. No focal neurological deficits. Extremities: Symmetric 5 x 5 power. Skin: No rashes, lesions or ulcers Psychiatry: Judgement  and insight appear normal. Mood & affect appropriate.     Data Reviewed: I have personally reviewed following labs and imaging studies  CBC:  Recent Labs Lab 07/21/16 1140 07/21/16 1206 07/22/16 0450 07/23/16 0245  WBC 4.8  --  3.7* 4.0  NEUTROABS 3.0  --   --   --   HGB 11.0* 11.2* 9.5* 9.7*  HCT 34.8* 33.0* 29.3* 29.9*  MCV 84.3  --  82.8 81.9  PLT 153  --  169 353   Basic Metabolic Panel:  Recent Labs Lab 07/21/16 1140 07/21/16 1206 07/22/16 0450 07/23/16 0245  NA 139 140 139 139  K 4.7 4.7 4.8 3.8  CL 108 105 111 108  CO2 22  --  21* 24  GLUCOSE 94 90 86 101*  BUN 19 22* 24* 22*  CREATININE 1.18* 1.20* 1.39* 1.31*  CALCIUM 8.8*  --  7.9* 8.9  MG  --   --  1.6*  --    GFR: Estimated Creatinine Clearance: 34.2 mL/min (by C-G formula based on SCr of 1.31 mg/dL (H)). Liver Function Tests:  Recent Labs Lab 07/21/16 1140  AST 46*  ALT 33  ALKPHOS 83  BILITOT 0.6  PROT 6.6  ALBUMIN 3.9    Recent Labs Lab 07/21/16 1140  LIPASE 12   No results for input(s): AMMONIA in the last 168 hours. Coagulation Profile:  Recent Labs Lab 07/21/16 1140  INR 1.19   Cardiac Enzymes:  Recent Labs Lab 07/21/16 1746 07/21/16 2140 07/22/16 0450  TROPONINI <0.03 <0.03 0.07*   BNP (last 3 results) No results for input(s): PROBNP in the last 8760 hours. HbA1C: No results for input(s): HGBA1C in the last 72 hours. CBG: No results for input(s): GLUCAP in the last 168 hours. Lipid Profile: No results for input(s): CHOL, HDL, LDLCALC, TRIG, CHOLHDL, LDLDIRECT in the last 72 hours. Thyroid Function Tests: No results for input(s): TSH, T4TOTAL, FREET4, T3FREE, THYROIDAB in the last 72 hours. Anemia Panel: No results for input(s): VITAMINB12, FOLATE, FERRITIN, TIBC, IRON, RETICCTPCT in the last 72 hours. Sepsis Labs:  Recent Labs Lab 07/21/16 1206 07/21/16 1715 07/22/16 0450 07/23/16 0245 07/24/16 0219  PROCALCITON  --   --  <0.10 <0.10 <0.10    LATICACIDVEN 3.37* 1.43  --   --   --     Recent Results (from the past 240 hour(s))  Urine culture     Status: None   Collection Time: 07/21/16 10:51 AM  Result Value Ref Range Status   Specimen Description URINE, RANDOM  Final   Special Requests NONE  Final   Culture NO GROWTH  Final   Report Status 07/23/2016 FINAL  Final  Culture, blood (routine x 2)     Status: None (Preliminary result)   Collection Time: 07/21/16 11:50 AM  Result Value Ref Range Status   Specimen  Description BLOOD RIGHT HAND  Final   Special Requests IN PEDIATRIC BOTTLE  3CC  Final   Culture NO GROWTH 2 DAYS  Final   Report Status PENDING  Incomplete  Culture, blood (routine x 2)     Status: None (Preliminary result)   Collection Time: 07/21/16 11:55 AM  Result Value Ref Range Status   Specimen Description RIGHT ANTECUBITAL  Final   Special Requests IN PEDIATRIC BOTTLE  3CC  Final   Culture NO GROWTH 2 DAYS  Final   Report Status PENDING  Incomplete  MRSA PCR Screening     Status: None   Collection Time: 07/21/16  6:01 PM  Result Value Ref Range Status   MRSA by PCR NEGATIVE NEGATIVE Final    Comment:        The GeneXpert MRSA Assay (FDA approved for NASAL specimens only), is one component of a comprehensive MRSA colonization surveillance program. It is not intended to diagnose MRSA infection nor to guide or monitor treatment for MRSA infections.   C difficile quick scan w PCR reflex     Status: None   Collection Time: 07/23/16 11:34 AM  Result Value Ref Range Status   C Diff antigen NEGATIVE NEGATIVE Final   C Diff toxin NEGATIVE NEGATIVE Final   C Diff interpretation No C. difficile detected.  Final         Radiology Studies: No results found.      Scheduled Meds: . amiodarone  400 mg Oral Q8H  . aspirin  81 mg Oral Daily  . donepezil  10 mg Oral QHS  . heparin subcutaneous  5,000 Units Subcutaneous Q8H  . lamoTRIgine  100 mg Oral BID  . levETIRAcetam  750 mg Oral BID  .  lipase/protease/amylase  24,000 Units Oral TID WC  . memantine  10 mg Oral BID  . QUEtiapine  12.5 mg Oral QPM   Continuous Infusions:   LOS: 3 days    Time spent: 35 minutes.     Elmarie Shiley, MD [facility] Pager [phone]  If 7PM-7AM, please contact night-coverage [email or web address] Password [email or web address] 07/24/2016, 10:20 AM

## 2016-07-24 NOTE — Progress Notes (Signed)
Pt refused bed alarm on. Daughter at bedside. Will continue to do hourly rounding.

## 2016-07-24 NOTE — Progress Notes (Signed)
Patient Name: Brittany [name] Date of Encounter: 07/24/2016  Primary Cardiologist: new (Dr. Tamala Julian)  [facility] Problem List     Active Problems:   Cardiogenic shock [facility])   Syncope and collapse   Encounter for central line care   Hypotension   AKI (acute kidney injury) ([place])    Subjective   Brittany [name] is wanting to go home.  She feels well and denies palpitations, chest pain or shortness of breath.   Inpatient Medications    Scheduled Meds: . amiodarone  400 mg Oral BID  . aspirin  81 mg Oral Daily  . donepezil  10 mg Oral QHS  . heparin subcutaneous  5,000 Units Subcutaneous Q8H  . lamoTRIgine  100 mg Oral BID  . levETIRAcetam  750 mg Oral BID  . lipase/protease/amylase  24,000 Units Oral TID WC  . memantine  10 mg Oral BID  . QUEtiapine  12.5 mg Oral QPM   Continuous Infusions:  PRN Meds: sodium chloride   Vital Signs    Vitals:   07/23/16 1300 07/23/16 2331 07/24/16 0719 07/24/16 0812  BP: 108/77 100/66 114/76 116/85  Pulse: 92 90 92 95  Resp: '18 18 18 18  '$ Temp: 98.7 F (37.1 C) 97.8 F (36.6 C) 97.9 F (36.6 C) 98 F (36.7 C)  TempSrc: Oral Oral Oral Oral  SpO2: 100% 97% 98% 99%  Weight:   46.3 kg (102 lb 1.6 oz)   Height:        Intake/Output Summary (Last 24 hours) at 07/24/16 0909 Last data filed at 07/24/16 8315  Gross per 24 hour  Intake              600 ml  Output              801 ml  Net             -201 ml   Filed Weights   07/22/16 1746 07/23/16 0554 07/24/16 0719  Weight: 45.9 kg (101 lb 4.8 oz) 45.6 kg (100 lb 8 oz) 46.3 kg (102 lb 1.6 oz)    Physical Exam    GEN: Frail. Well developed, in no acute distress.  HEENT: Grossly normal.  Neck: Supple, JVP 2cm above clavicle at 45 degrees, carotid bruits, or masses. Cardiac: Tachycardic.  Irregularly irregular. No murmurs, rubs, or gallops. No clubbing, cyanosis, edema.  Radials/DP/PT 2+ and equal bilaterally.  Respiratory:  Respirations regular and unlabored.  Bibasilar crackles.    GI: Soft, nontender, nondistended, BS + x 4. MS: no deformity or atrophy. Skin: warm and dry, no rash. Neuro:  Strength and sensation are intact. Psych: AAOx3.  Normal affect.  Labs    CBC  Recent Labs  07/21/16 1140  07/22/16 0450 07/23/16 0245  WBC 4.8  --  3.7* 4.0  NEUTROABS 3.0  --   --   --   HGB 11.0*  < > 9.5* 9.7*  HCT 34.8*  < > 29.3* 29.9*  MCV 84.3  --  82.8 81.9  PLT 153  --  169 158  < > = values in this interval not displayed. Basic Metabolic Panel  Recent Labs  07/22/16 0450 07/23/16 0245  NA 139 139  K 4.8 3.8  CL 111 108  CO2 21* 24  GLUCOSE 86 101*  BUN 24* 22*  CREATININE 1.39* 1.31*  CALCIUM 7.9* 8.9  MG 1.6*  --    Liver Function Tests  Recent Labs  07/21/16 1140  AST 46*  ALT 33  ALKPHOS 83  BILITOT 0.6  PROT 6.6  ALBUMIN 3.9    Recent Labs  07/21/16 1140  LIPASE 12   Cardiac Enzymes  Recent Labs  07/21/16 1746 07/21/16 2140 07/22/16 0450  TROPONINI <0.03 <0.03 0.07*   BNP Invalid input(s): POCBNP D-Dimer No results for input(s): DDIMER in the last 72 hours. Hemoglobin A1C No results for input(s): HGBA1C in the last 72 hours. Fasting Lipid Panel No results for input(s): CHOL, HDL, LDLCALC, TRIG, CHOLHDL, LDLDIRECT in the last 72 hours. Thyroid Function Tests No results for input(s): TSH, T4TOTAL, T3FREE, THYROIDAB in the last 72 hours.  Invalid input(s): FREET3  Telemetry    Atrial flutter.  Rates 90s-130s.  Currently 123 bpm.  - Personally Reviewed  ECG    n/a - Personally Reviewed  Radiology    No results found.  Cardiac Studies   Echo 07/21/16: Study Conclusions  - Left ventricle: The cavity size was normal. Wall thickness was   increased in a pattern of severe LVH. Systolic function was   normal. The estimated ejection fraction was in the range of 50%   to 55%. The study is not technically sufficient to allow   evaluation of LV diastolic function. - Mitral valve: There was moderate  regurgitation. - Left atrium: The atrium was moderately dilated. - Right atrium: The atrium was moderately dilated. - Atrial septum: No defect or patent foramen ovale was identified. - Pulmonary arteries: PA peak pressure: 53 mm Hg (S).  Patient Profile     Ms. Brittany [name] is a 76F with chronic diastolic heart failure, hypertensive heart disease, CKD III, COPD, alcoholism, and PAD here with weakness and dizziness.  Noted to be in atrial fibrillation with RVR.  Assessment & Plan    # Persistent atrial flutter:  Heart rates remain poorly-controlled.  CHADSvasc 6.  She is not on anticoagulation due to alcoholism and falls.  Started on amiodarone 07/23/16 due to poorly controlled rates and low BP.  We will increase to '400mg'$  q8h with plans for a 5g load.  Then '200mg'$  daily.  Heart rates remain elevated.  Phenylephrine was stopped 1/6.  Continue loading with amiodarone.  If BP remains where it is consider metoprolol 12.'5mg'$  q12h.  Will check thyroid function.  She will need PFTs as an outpatient.   # Hypotension: Baseline BP in the 90s.  Currently low 720T systolic.  Continue to monitor.  # Moderate pulmonary hypertension:   # Chronic diastolic heart failure: PASP 53 mmHg on echo.  She is mildly volume overloaded with basilar crackles.  However she is not in respiratory distress and was on pressors 2 days ago.  We will not diurese for now.    Signed, Skeet Latch, MD  07/24/2016, 9:09 AM

## 2016-07-25 DIAGNOSIS — I950 Idiopathic hypotension: Secondary | ICD-10-CM

## 2016-07-25 LAB — BASIC METABOLIC PANEL
Anion gap: 9 (ref 5–15)
BUN: 10 mg/dL (ref 6–20)
CALCIUM: 9.5 mg/dL (ref 8.9–10.3)
CHLORIDE: 107 mmol/L (ref 101–111)
CO2: 24 mmol/L (ref 22–32)
CREATININE: 1.17 mg/dL — AB (ref 0.44–1.00)
GFR calc non Af Amer: 50 mL/min — ABNORMAL LOW (ref >60)
GFR, EST AFRICAN AMERICAN: 58 mL/min — AB (ref >60)
Glucose, Bld: 105 mg/dL — ABNORMAL HIGH (ref 65–99)
Potassium: 3.7 mmol/L (ref 3.5–5.1)
SODIUM: 140 mmol/L (ref 135–145)

## 2016-07-25 LAB — T3, FREE: T3 FREE: 3.2 pg/mL (ref 2.0–4.4)

## 2016-07-25 MED ORDER — METOPROLOL TARTRATE 12.5 MG HALF TABLET
12.5000 mg | ORAL_TABLET | Freq: Two times a day (BID) | ORAL | Status: DC
  Administered 2016-07-25 – 2016-07-26 (×3): 12.5 mg via ORAL
  Filled 2016-07-25 (×3): qty 1

## 2016-07-25 NOTE — Evaluation (Signed)
Physical Therapy Evaluation Patient Details Name: Brittany [name] MRN: [id number] DOB: 11-03-1955 Today's Date: 07/25/2016   History of Present Illness  61 y.o lady with past medical history significant for seizures  brought to ED after experiencing generalized weakness and dizziness found to be hypotensive upon arrival.  Clinical Impression  Patient demonstrates deficits in functional mobility as indicated below. Will need continued skilled PT to address deficits and maximize function. Will see as indicated and progress as tolerated.   OF NOTE: HR elevation to 140s with short distance ambulation    Follow Up Recommendations Supervision for mobility/OOB    Equipment Recommendations  None recommended by PT    Recommendations for Other Services       Precautions / Restrictions Precautions Precautions: Fall Precaution Comments: watch HR  Restrictions Weight Bearing Restrictions: No      Mobility  Bed Mobility               General bed mobility comments: received in chair  Transfers Overall transfer level: Needs assistance Equipment used: None Transfers: Sit to/from Stand Sit to Stand: Supervision         General transfer comment: no physical assist required  Ambulation/Gait Ambulation/Gait assistance: Min guard Ambulation Distance (Feet): 180 Feet Assistive device: None Gait Pattern/deviations: Decreased stride length;Step-through pattern;Trendelenburg;Narrow base of support (ambulates ith vaulting when not wearing orthotic shoe) Gait velocity: decreased Gait velocity interpretation: Below normal speed for age/gender General Gait Details: patient with vaulting gait pattern and soem instability when not wearing foot orthotic. toelrated increased distance but noted to have elevated HR 140s during ambulation, improved to 90s with rest  Stairs            Wheelchair Mobility    Modified Rankin (Stroke Patients Only)       Balance Overall balance  assessment: History of Falls                                           Pertinent Vitals/Pain Pain Assessment: No/denies pain    Home Living Family/patient expects to be discharged to:: Private residence Living Arrangements: Children;Spouse/significant other Available Help at Discharge: Family;Available 24 hours/day Type of Home: House Home Access: Stairs to enter Entrance Stairs-Rails: Can reach both Entrance Stairs-Number of Steps: 2   Home Equipment: None Additional Comments: patient used to have a cane but no longers uses this, does have a orthotic toe lift in shoe    Prior Function Level of Independence: Needs assistance         Comments: supervision for activity, ambulates independently but has a orthotic lift for shoe     Hand Dominance   Dominant Hand: Right    Extremity/Trunk Assessment   Upper Extremity Assessment Upper Extremity Assessment: Overall WFL for tasks assessed    Lower Extremity Assessment Lower Extremity Assessment: Overall WFL for tasks assessed       Communication   Communication: No difficulties  Cognition Arousal/Alertness: Awake/alert Behavior During Therapy: WFL for tasks assessed/performed Overall Cognitive Status: No family/caregiver present to determine baseline cognitive functioning                      General Comments      Exercises     Assessment/Plan    PT Assessment Patient needs continued PT services  PT Problem List Decreased strength;Decreased activity tolerance;Decreased balance;Decreased mobility;Cardiopulmonary status limiting activity  PT Treatment Interventions DME instruction;Gait training;Functional mobility training;Stair training;Therapeutic activities;Therapeutic exercise;Balance training;Patient/family education    PT Goals (Current goals can be found in the Care Plan section)  Acute Rehab PT Goals Patient Stated Goal: to go home PT Goal Formulation: With  patient Time For Goal Achievement: 08/08/16 Potential to Achieve Goals: Good    Frequency Min 3X/week   Barriers to discharge        Co-evaluation               End of Session Equipment Utilized During Treatment: Gait belt Activity Tolerance: Patient tolerated treatment well Patient left: in chair;with call bell/phone within reach;with chair alarm set Nurse Communication: Mobility status         Time: 3838-1840 PT Time Calculation (min) (ACUTE ONLY): 17 min   Charges:   PT Evaluation $PT Eval Moderate Complexity: 1 Procedure     PT G Codes:        Duncan Dull 08-13-16, 11:28 AM Alben Deeds, PT DPT  [phone]

## 2016-07-25 NOTE — Progress Notes (Signed)
PROGRESS NOTE    Brittany [name]  QPY:[id number] DOB: Feb 03, 1956 DOA: 07/21/2016 PCP: No PCP Per Patient    Brief Narrative:  Brittany [name], 61 y.o lady with past medical history significant for seizures  brought to ED after experiencing generalized weakness and dizziness. She is a patient at [facility] program, when she was getting ready to go there this morning she felt sudden generalized weakness and dizziness, associated with diaphoresis, which make her to sit down, according to son she never lost consciousness, he denies any seizure-like activity.the patient became cold and clammy.She denies any chest pain or palpitations.   She was hypotensive with BP of 60/42, tachycardic with lactic acidosis. No leukocytosis or fever. 3L of bolus were unable to bring her BP up. Central line was placed for pressors. Her shock is more consistent with cardiogenic in origin. She was admitted to ICU for shock and pressor needs. Subsequently pressors stopped and patient was transfer to telemetry   Assessment & Plan:   Active Problems:   Cardiogenic shock (HCC)   Syncope and collapse   Encounter for central line care   Hypotension   AKI (acute kidney injury) ([place])  1-Shock, Hypotension;  Off pressors.  Cortisol; 32 Blood pressure improved.  No evidence of infection.   Diarrhea;  C dif negative.  Stop IV antibiotics.  Chronic diarrhea per daughter.  ova and parasite pending.  Follow up outpatient with GI.  Lactose free diet  \ AKI;  Renal function improving.   Sepsis; ruled out.  Blood culture, urine culture no growth.  pro calcitonin less than 0.10 Vital stable, afebrile./   History of seizure;  continue with Lamictal and keppra.   Chronic Diastolic HF; compensated.  Cardiology following.   A fib; on amiodarone.  Thought to be high risk for bleeding and falls.  HR has been elevated. Cardiology adjusting medication.  Plan to start low dose metoprolol.   DVT prophylaxis: Heparin  Code  Status: Full code.  Family Communication: none at bedside.  Disposition Plan: remain inpatient. Home when ok by cardiology   Consultants:   Cardiology    Procedures:  ECHO; - Left ventricle: The cavity size was normal. Wall thickness was increased in a pattern of severe LVH. Systolic function was   normal. The estimated ejection fraction was in the range of 50% to 55%. The study is not technically sufficient to allow evaluation of LV diastolic function.- Mitral valve: There was moderate regurgitation.- Left atrium: The atrium was moderately dilated. - Right atrium: The atrium was moderately dilated. - Atrial septum: No defect or patent foramen ovale was identified. - Pulmonary arteries: PA peak pressure: 53 mm Hg (S).    Antimicrobials:   Vancomycin 1-05  Zosyn 1-05   Subjective: She is feeling ok.   Denies chest pain or dyspnea.    Objective: Vitals:   07/24/16 1227 07/24/16 1707 07/24/16 2042 07/25/16 0459  BP: 116/89 117/83 106/89 (!) 116/91  Pulse: 90 (!) 115  99  Resp: '18 18 18 18  '$ Temp: 98.4 F (36.9 C) 98.8 F (37.1 C) 98.7 F (37.1 C) 98.9 F (37.2 C)  TempSrc: Oral Oral Oral Oral  SpO2: 100% 100% 100% 96%  Weight:    46.4 kg (102 lb 3.2 oz)  Height:        Intake/Output Summary (Last 24 hours) at 07/25/16 1316 Last data filed at 07/25/16 0926  Gross per 24 hour  Intake  600 ml  Output                0 ml  Net              600 ml   Filed Weights   07/23/16 0554 07/24/16 0719 07/25/16 0459  Weight: 45.6 kg (100 lb 8 oz) 46.3 kg (102 lb 1.6 oz) 46.4 kg (102 lb 3.2 oz)    Examination:  General exam: Appears calm and comfortable  Respiratory system: Clear to auscultation. Respiratory effort normal. Cardiovascular system: S1 & S2 heard, RRR. No JVD, murmurs, rubs, gallops or clicks. No pedal edema. Gastrointestinal system: Abdomen is nondistended, soft and nontender. No organomegaly or masses felt. Normal bowel sounds heard. Central nervous  system: Alert and oriented. No focal neurological deficits. Extremities: Symmetric 5 x 5 power. Skin: No rashes, lesions or ulcers Psychiatry: Judgement and insight appear normal. Mood & affect appropriate.     Data Reviewed: I have personally reviewed following labs and imaging studies  CBC:  Recent Labs Lab 07/21/16 1140 07/21/16 1206 07/22/16 0450 07/23/16 0245  WBC 4.8  --  3.7* 4.0  NEUTROABS 3.0  --   --   --   HGB 11.0* 11.2* 9.5* 9.7*  HCT 34.8* 33.0* 29.3* 29.9*  MCV 84.3  --  82.8 81.9  PLT 153  --  169 732   Basic Metabolic Panel:  Recent Labs Lab 07/21/16 1140 07/21/16 1206 07/22/16 0450 07/23/16 0245 07/25/16 1030  NA 139 140 139 139 140  K 4.7 4.7 4.8 3.8 3.7  CL 108 105 111 108 107  CO2 22  --  21* 24 24  GLUCOSE 94 90 86 101* 105*  BUN 19 22* 24* 22* 10  CREATININE 1.18* 1.20* 1.39* 1.31* 1.17*  CALCIUM 8.8*  --  7.9* 8.9 9.5  MG  --   --  1.6*  --   --    GFR: Estimated Creatinine Clearance: 38.4 mL/min (by C-G formula based on SCr of 1.17 mg/dL (H)). Liver Function Tests:  Recent Labs Lab 07/21/16 1140  AST 46*  ALT 33  ALKPHOS 83  BILITOT 0.6  PROT 6.6  ALBUMIN 3.9    Recent Labs Lab 07/21/16 1140  LIPASE 12   No results for input(s): AMMONIA in the last 168 hours. Coagulation Profile:  Recent Labs Lab 07/21/16 1140  INR 1.19   Cardiac Enzymes:  Recent Labs Lab 07/21/16 1746 07/21/16 2140 07/22/16 0450  TROPONINI <0.03 <0.03 0.07*   BNP (last 3 results) No results for input(s): PROBNP in the last 8760 hours. HbA1C: No results for input(s): HGBA1C in the last 72 hours. CBG: No results for input(s): GLUCAP in the last 168 hours. Lipid Profile: No results for input(s): CHOL, HDL, LDLCALC, TRIG, CHOLHDL, LDLDIRECT in the last 72 hours. Thyroid Function Tests:  Recent Labs  07/24/16 1036  TSH 2.240  FREET4 1.24*  T3FREE 3.2   Anemia Panel: No results for input(s): VITAMINB12, FOLATE, FERRITIN, TIBC, IRON,  RETICCTPCT in the last 72 hours. Sepsis Labs:  Recent Labs Lab 07/21/16 1206 07/21/16 1715 07/22/16 0450 07/23/16 0245 07/24/16 0219  PROCALCITON  --   --  <0.10 <0.10 <0.10  LATICACIDVEN 3.37* 1.43  --   --   --     Recent Results (from the past 240 hour(s))  Urine culture     Status: None   Collection Time: 07/21/16 10:51 AM  Result Value Ref Range Status   Specimen Description URINE, RANDOM  Final  Special Requests NONE  Final   Culture NO GROWTH  Final   Report Status 07/23/2016 FINAL  Final  Culture, blood (routine x 2)     Status: None (Preliminary result)   Collection Time: 07/21/16 11:50 AM  Result Value Ref Range Status   Specimen Description BLOOD RIGHT HAND  Final   Special Requests IN PEDIATRIC BOTTLE  3CC  Final   Culture NO GROWTH 3 DAYS  Final   Report Status PENDING  Incomplete  Culture, blood (routine x 2)     Status: None (Preliminary result)   Collection Time: 07/21/16 11:55 AM  Result Value Ref Range Status   Specimen Description RIGHT ANTECUBITAL  Final   Special Requests IN PEDIATRIC BOTTLE  3CC  Final   Culture NO GROWTH 3 DAYS  Final   Report Status PENDING  Incomplete  MRSA PCR Screening     Status: None   Collection Time: 07/21/16  6:01 PM  Result Value Ref Range Status   MRSA by PCR NEGATIVE NEGATIVE Final    Comment:        The GeneXpert MRSA Assay (FDA approved for NASAL specimens only), is one component of a comprehensive MRSA colonization surveillance program. It is not intended to diagnose MRSA infection nor to guide or monitor treatment for MRSA infections.   C difficile quick scan w PCR reflex     Status: None   Collection Time: 07/23/16 11:34 AM  Result Value Ref Range Status   C Diff antigen NEGATIVE NEGATIVE Final   C Diff toxin NEGATIVE NEGATIVE Final   C Diff interpretation No C. difficile detected.  Final         Radiology Studies: No results found.      Scheduled Meds: . amiodarone  400 mg Oral Q8H  .  aspirin  81 mg Oral Daily  . donepezil  10 mg Oral QHS  . heparin subcutaneous  5,000 Units Subcutaneous Q8H  . lamoTRIgine  100 mg Oral BID  . levETIRAcetam  750 mg Oral BID  . lipase/protease/amylase  24,000 Units Oral TID WC  . memantine  10 mg Oral BID  . metoprolol tartrate  12.5 mg Oral BID  . QUEtiapine  12.5 mg Oral QPM   Continuous Infusions:   LOS: 4 days    Time spent: 35 minutes.     Elmarie Shiley, MD [facility] Pager [phone]  If 7PM-7AM, please contact night-coverage [email or web address] Password [email or web address] 07/25/2016, 1:16 PM

## 2016-07-25 NOTE — Progress Notes (Signed)
Patient Name: Brittany [name] Date of Encounter: 07/25/2016  Primary Cardiologist: new (Dr. Tamala Julian)  [facility] Problem List     Active Problems:   Cardiogenic shock [facility])   Syncope and collapse   Encounter for central line care   Hypotension   AKI (acute kidney injury) ([place])    Subjective   Brittany [name] is wanting to go home.  She feels well and denies palpitations, chest pain or shortness of breath.   Inpatient Medications    Scheduled Meds: . amiodarone  400 mg Oral Q8H  . aspirin  81 mg Oral Daily  . donepezil  10 mg Oral QHS  . heparin subcutaneous  5,000 Units Subcutaneous Q8H  . lamoTRIgine  100 mg Oral BID  . levETIRAcetam  750 mg Oral BID  . lipase/protease/amylase  24,000 Units Oral TID WC  . memantine  10 mg Oral BID  . QUEtiapine  12.5 mg Oral QPM   Continuous Infusions:  PRN Meds: sodium chloride   Vital Signs    Vitals:   07/24/16 1227 07/24/16 1707 07/24/16 2042 07/25/16 0459  BP: 116/89 117/83 106/89 (!) 116/91  Pulse: 90 (!) 115  99  Resp: '18 18 18 18  '$ Temp: 98.4 F (36.9 C) 98.8 F (37.1 C) 98.7 F (37.1 C) 98.9 F (37.2 C)  TempSrc: Oral Oral Oral Oral  SpO2: 100% 100% 100% 96%  Weight:    46.4 kg (102 lb 3.2 oz)  Height:        Intake/Output Summary (Last 24 hours) at 07/25/16 1111 Last data filed at 07/25/16 0926  Gross per 24 hour  Intake              600 ml  Output              350 ml  Net              250 ml   Filed Weights   07/23/16 0554 07/24/16 0719 07/25/16 0459  Weight: 45.6 kg (100 lb 8 oz) 46.3 kg (102 lb 1.6 oz) 46.4 kg (102 lb 3.2 oz)    Physical Exam    GEN: Frail. Well developed, in no acute distress.  HEENT: Grossly normal.  Neck: Supple, JVP 2cm above clavicle at 45 degrees, carotid bruits, or masses. Cardiac: Tachycardic.  Irregularly irregular. No murmurs, rubs, or gallops. No clubbing, cyanosis, edema.  Radials/DP/PT 2+ and equal bilaterally.  Respiratory:  Respirations regular and unlabored.  Bibasilar  crackles.  GI: Soft, nontender, nondistended, BS + x 4. MS: no deformity or atrophy. Skin: warm and dry, no rash. Neuro:  Strength and sensation are intact. Psych: AAOx3.  Normal affect.  Labs    CBC  Recent Labs  07/23/16 0245  WBC 4.0  HGB 9.7*  HCT 29.9*  MCV 81.9  PLT 833   Basic Metabolic Panel  Recent Labs  07/23/16 0245  NA 139  K 3.8  CL 108  CO2 24  GLUCOSE 101*  BUN 22*  CREATININE 1.31*  CALCIUM 8.9   Liver Function Tests No results for input(s): AST, ALT, ALKPHOS, BILITOT, PROT, ALBUMIN in the last 72 hours. No results for input(s): LIPASE, AMYLASE in the last 72 hours. Cardiac Enzymes No results for input(s): CKTOTAL, CKMB, CKMBINDEX, TROPONINI in the last 72 hours. BNP Invalid input(s): POCBNP D-Dimer No results for input(s): DDIMER in the last 72 hours. Hemoglobin A1C No results for input(s): HGBA1C in the last 72 hours. Fasting Lipid Panel No results for input(s): CHOL, HDL,  LDLCALC, TRIG, CHOLHDL, LDLDIRECT in the last 72 hours. Thyroid Function Tests  Recent Labs  07/24/16 1036  TSH 2.240  T3FREE 3.2    Telemetry    Atrial flutter.  Rates 90s-130s.  Currently 123 bpm.  - Personally Reviewed  ECG    n/a - Personally Reviewed  Radiology    No results found.  Cardiac Studies   Echo 07/21/16: Study Conclusions  - Left ventricle: The cavity size was normal. Wall thickness was   increased in a pattern of severe LVH. Systolic function was   normal. The estimated ejection fraction was in the range of 50%   to 55%. The study is not technically sufficient to allow   evaluation of LV diastolic function. - Mitral valve: There was moderate regurgitation. - Left atrium: The atrium was moderately dilated. - Right atrium: The atrium was moderately dilated. - Atrial septum: No defect or patent foramen ovale was identified. - Pulmonary arteries: PA peak pressure: 53 mm Hg (S).  Patient Profile     Brittany [name] is a 18F with chronic  diastolic heart failure, hypertensive heart disease, CKD III, COPD, alcoholism, and PAD here with weakness and dizziness.  Noted to be in atrial fibrillation with RVR.  Assessment & Plan    # Persistent atrial flutter:  Heart rates remain poorly-controlled.  CHADSvasc 6.  She is not on anticoagulation due to falls.  Started on amiodarone 07/23/16 due to poorly controlled rates and low BP.  Amiodarone was increased to '400mg'$  q8h on 1/8 with plans for a 5g load.  Then '200mg'$  daily.  Heart rates remain elevated.  We will add metoprolol 12.'5mg'$  q12h.  Phenylephrine was stopped 1/6.  Thyroid function was normal.  She will need PFTs as an outpatient.   # Hypotension: Baseline BP in the 90s but now in the 110s.  We will start metoprolol for rate control as above.   # Moderate pulmonary hypertension:   # Chronic diastolic heart failure: PASP 53 mmHg on echo.  She is mildly volume overloaded with basilar crackles.  However she is not in respiratory distress and was on pressors 2 days ago.  We will not diurese for now.    Signed, Skeet Latch, MD  07/25/2016, 11:11 AM

## 2016-07-26 DIAGNOSIS — E440 Moderate protein-calorie malnutrition: Secondary | ICD-10-CM | POA: Insufficient documentation

## 2016-07-26 DIAGNOSIS — I4892 Unspecified atrial flutter: Secondary | ICD-10-CM

## 2016-07-26 LAB — CULTURE, BLOOD (ROUTINE X 2)
CULTURE: NO GROWTH
Culture: NO GROWTH

## 2016-07-26 MED ORDER — ENSURE ENLIVE PO LIQD
237.0000 mL | Freq: Two times a day (BID) | ORAL | Status: DC
  Administered 2016-07-26: 237 mL via ORAL

## 2016-07-26 MED ORDER — PANCRELIPASE (LIP-PROT-AMYL) 12000-38000 UNITS PO CPEP
24000.0000 [IU] | ORAL_CAPSULE | Freq: Three times a day (TID) | ORAL | Status: DC
  Administered 2016-07-26 (×3): 24000 [IU] via ORAL
  Filled 2016-07-26 (×2): qty 2

## 2016-07-26 MED ORDER — METOPROLOL TARTRATE 25 MG PO TABS: 12.5000 mg | ORAL_TABLET | Freq: Two times a day (BID) | ORAL | 1 refills | Status: DC

## 2016-07-26 MED ORDER — AMIODARONE HCL 200 MG PO TABS: 200.0000 mg | ORAL_TABLET | Freq: Every day | ORAL | 1 refills | Status: DC

## 2016-07-26 MED ORDER — AMIODARONE HCL 400 MG PO TABS: 400.0000 mg | ORAL_TABLET | Freq: Three times a day (TID) | ORAL | 0 refills | Status: DC

## 2016-07-26 NOTE — Progress Notes (Signed)
Patient's payer source is Medicaid; unable to arrange for HHPT without a qualifying diagnosis per Medicaid guidelines; B Pennie Rushing [phone]

## 2016-07-26 NOTE — Progress Notes (Signed)
Initial Nutrition Assessment  DOCUMENTATION CODES:   Non-severe (moderate) malnutrition in context of chronic illness, Underweight  INTERVENTION:  Provide Ensure Enlive po BID, each supplement provides 350 kcal and 20 grams of protein Provided and discussed "Heart Failure Nutrition Therapy for the Undernourished" handout from the Academy of Nutrition and Dietetics   NUTRITION DIAGNOSIS:   Malnutrition related to chronic illness as evidenced by moderate depletions of muscle mass, moderate depletion of body fat.   GOAL:   Patient will meet greater than or equal to 90% of their needs   MONITOR:   PO intake, Supplement acceptance, Labs, Weight trends, I & O's, Skin  REASON FOR ASSESSMENT:   Consult Assessment of nutrition requirement/status  ASSESSMENT:   36F with chronic diastolic heart failure, hypertensive heart disease, CKD III, COPD, alcoholism, and PAD here with weakness and dizziness.  Noted to be in atrial fibrillation with RVR.  Pt states that she usually weighs 119 lbs. She relates some weight loss to chronic illnesses and some due to not liking the food in the hospital. She has moderate muscle wasting and moderate fat wasting per nutrition-focused physical exam. She reports having a good appetite and eating 2-3 meals daily PTA. RD discussed the importance of adequate calorie, protein, and nutrient intake. Recommended consuming 3 meals plus snacks daily. Discussed ways to increase calories and protein with nutrient-rich foods. Pt receptive and is agreeable to trying Ensure Enlive.   Labs: low hemoglobin  Diet Order:  Diet regular Room service appropriate? Yes; Fluid consistency: Thin  Skin:  Reviewed, no issues  Last BM:  1/9  Height:   Ht Readings from Last 1 Encounters:  07/22/16 5' 3.5" (1.613 m)    Weight:   Wt Readings from Last 1 Encounters:  07/26/16 101 lb 6.4 oz (46 kg)    Ideal Body Weight:  53.4 kg  BMI:  Body mass index is 17.68  kg/m.  Estimated Nutritional Needs:   Kcal:  1600-1800  Protein:  70-80 grams  Fluid:  1.6-1.8 L/day  EDUCATION NEEDS:   Education needs addressed  Scarlette Ar RD, CSP, LDN Inpatient Clinical Dietitian Pager: [phone] After Hours Pager: [phone]

## 2016-07-26 NOTE — Discharge Summary (Signed)
Physician Discharge Summary  Brittany [name] OEU:[id number] DOB: 07/20/1955 DOA: 07/21/2016  PCP: No PCP Per Patient  Admit date: 07/21/2016 Discharge date: 07/26/2016  Admitted From: home Disposition:  home  Recommendations for Outpatient Follow-up:  1. Follow up with Cardiology as scheduled next week  Home Health: PT  Discharge Condition: stable CODE STATUS: Full Diet recommendation: heart healthy  HPI: Per Dr. Titus Mould, Brittany [name], 61 y.o lady with past medical history significant for seizures  brought to ED after experiencing generalized weakness and dizziness. She is a patient at [facility] program, when she was getting ready to go there this morning she felt sudden generalized weakness and dizziness, associated with diaphoresis, which make her to sit down, according to son she never lost consciousness, he denies any seizure-like activity.the patient became cold and clammy.She denies any chest pain or palpitations. According to her son she did had a couple of TIA in the past ,  no documented history available, patient and her son denied any history of heart problem. She denies any headache , but do endorse having some blurry vision during this event.she denies any focal weakness. No history of recent fever, upper respiratory symptoms, nausea, vomiting or diarrhea.No urinary complaints.  Hospital Course: Discharge Diagnoses:  Active Problems:   Cardiogenic shock (HCC)   Syncope and collapse   Encounter for central line care   Hypotension   AKI (acute kidney injury) ([place])   Atrial flutter (HCC)   Malnutrition of moderate degree  Shock, Hypotension - Patient was admitted to shock and initially she was in ICU, she was briefly on pressors however was able to be weaned off rapidly. Her cortisol was checked and it was found to be appropriately elevated at 32. She was initially treated with antibiotics, however cultures have remained negative and pro calcitonin was low, and her shock was most  likely felt to be cardiogenic rather than septic, her antibiotics were discontinued on 1/7, she came off of pressors since 1/6, and she has remained stable since. Her blood pressure improved, and beta blockers were added per cardiology 2 to A. fib with RVR, and she has been able to tolerate beta blockers without further issues. She had a 2-D echo done which showed normal ejection fraction 50-55%. Diarrhea - C. difficile was negative, per daughter this is chronic. Patient has a history of chronic pancreatitis, continue Creon. AKI - in the setting of hypotension, renal function is now improved Sepsis - ruled out History of seizure - continue with Lamictal and keppra.  Chronic Diastolic HF - compensated.  A fib - on amiodarone started per cardiology, will need to 400 mg 3 times a day for couple more days, and then 200 mg daily further recommendations. Her chads risk score is greater than 2, however she has been thought to be very high risk for bleeding and falls, and cardiology decided against anticoagulation. Her rate is controlled at rest with her blockers and amiodarone, cardiology cleared patient for home discharge, she will be sent home in stable condition and has follow-up with cardiology as an outpatient next week.   Discharge Instructions  Allergies as of 07/26/2016   No Known Allergies     Medication List    STOP taking these medications   sotalol 80 MG tablet Commonly known as:  BETAPACE     TAKE these medications   acetaminophen 500 MG tablet Commonly known as:  TYLENOL Take 500-1,000 mg by mouth 2 (two) times daily as needed (for pain).   amiodarone  400 MG tablet Commonly known as:  PACERONE Take 1 tablet (400 mg total) by mouth every 8 (eight) hours.   amiodarone 200 MG tablet Commonly known as:  PACERONE Take 1 tablet (200 mg total) by mouth daily. Start taking 07/30/15   aspirin EC 325 MG tablet Take 325 mg by mouth daily.   CREON 24000-76000 units Cpep Generic drug:   Pancrelipase (Lip-Prot-Amyl) Take 1 capsule by mouth 3 (three) times daily with meals. FOR DIGESTION   donepezil 10 MG tablet Commonly known as:  ARICEPT Take 10 mg by mouth at bedtime. Notes to patient:  07/26/2016 tonight    feeding supplement (VITAL 1.5 CAL) Liqd Take 237 mLs by mouth See admin instructions. TWO TO THREE TIMES A DAY   lamoTRIgine 100 MG tablet Commonly known as:  LAMICTAL Take 100 mg by mouth 2 (two) times daily.   latanoprost 0.005 % ophthalmic solution Commonly known as:  XALATAN Place 1 drop into both eyes at bedtime.   levETIRAcetam 750 MG tablet Commonly known as:  KEPPRA Take 750 mg by mouth 2 (two) times daily.   memantine 10 MG tablet Commonly known as:  NAMENDA Take 10 mg by mouth 2 (two) times daily.   metoprolol tartrate 25 MG tablet Commonly known as:  LOPRESSOR Take 0.5 tablets (12.5 mg total) by mouth 2 (two) times daily.   QUEtiapine 25 MG tablet Commonly known as:  SEROQUEL Take 12.5 mg by mouth every evening.   Vitamin D3 2000 units Tabs Take 2,000 Units by mouth daily.      Follow-up Information    Angelena Form, PA-C Follow up on 08/04/2016.   Specialties:  Cardiology, Radiology Why:  at 1:15ppm for your follow up appt.  Contact information: [place] [address] [phone]          No Known Allergies  Consultations:  Cardiology   PCCM  Procedures/Studies:  2D echo Study Conclusions - Left ventricle: The cavity size was normal. Wall thickness was increased in a pattern of severe LVH. Systolic function was normal. The estimated ejection fraction was in the range of 50% to 55%. The study is not technically sufficient to allow evaluation of LV diastolic function. - Mitral valve: There was moderate regurgitation. - Left atrium: The atrium was moderately dilated. - Right atrium: The atrium was moderately dilated. - Atrial septum: No defect or patent foramen ovale was identified. -  Pulmonary arteries: PA peak pressure: 53 mm Hg (S).  Ct Head Wo Contrast  Result Date: 07/21/2016 CLINICAL DATA:  Syncope, hypotensive and septic.  No headache. EXAM: CT HEAD WITHOUT CONTRAST TECHNIQUE: Contiguous axial images were obtained from the base of the skull through the vertex without intravenous contrast. COMPARISON:  None. FINDINGS: BRAIN: There is sulcal and ventricular prominence consistent with superficial and central atrophy. No intraparenchymal hemorrhage, mass effect nor midline shift. Mild degree of periventricular and subcortical white matter hypodensities consistent with chronic small vessel ischemic disease are identified. No acute large vascular territory infarcts. No abnormal extra-axial fluid collections. Basal cisterns are not effaced and midline. Normal variant cavum septum pellucidum and vergae. VASCULAR: Moderate calcific atherosclerosis of the carotid siphons. SKULL: No skull fracture. No significant scalp soft tissue swelling. SINUSES/ORBITS: The mastoid air-cells are clear. The included paranasal sinuses are well-aerated.The included ocular globes and orbital contents are non-suspicious. OTHER: None. IMPRESSION: Likely chronic small vessel ischemic disease of periventricular white matter with cerebral atrophy. Electronically Signed   By: Meredith Leeds.D.  On: 07/21/2016 17:20   Dg Chest Port 1 View  Result Date: 07/22/2016 CLINICAL DATA:  Pt states she "originally came to the hospital because she passed out and thought she was having a stroke." No current chest complaints. RN states Dr Lu Duffel be wanting to evaluate fluid levels. EXAM: PORTABLE CHEST 1 VIEW COMPARISON:  07/21/2016. FINDINGS: Mild hazy airspace opacity developed in the right mid to lower lung. Mild interstitial thickening in the lower lungs is stable. Lungs are hyperexpanded. Cardiac silhouette is mildly enlarged. Left internal jugular central venous line is stable. No pneumothorax.  No definite pleural effusion.  IMPRESSION: 1. Mild hazy airspace opacity has developed in the right mid to lower lung. This may be from layering pleural fluid. Mild airspace edema is possible. 2. No other change. Mild persistent lower lung interstitial thickening. Overall, findings again suggests mild congestive heart failure. Electronically Signed   By: Lajean Manes M.D.   On: 07/22/2016 07:59   Dg Chest Port 1 View  Result Date: 07/21/2016 CLINICAL DATA:  Central line placement EXAM: PORTABLE CHEST 1 VIEW COMPARISON:  07/21/2016 FINDINGS: There is a left jugular central venous catheter with the tip projecting over the SVC. There is bilateral diffuse interstitial thickening. There is a trace right pleural effusion. There is no left pleural effusion. There is no pneumothorax. There is mild stable cardiomegaly. The osseous structures are unremarkable. IMPRESSION: Mild CHF. Left jugular central venous catheter with the tip projecting over the SVC. Electronically Signed   By: Kathreen Devoid   On: 07/21/2016 15:12   Dg Chest Port 1 View  Result Date: 07/21/2016 CLINICAL DATA:  Syncope.  Chest pain.  Shortness of breath. EXAM: PORTABLE CHEST 1 VIEW COMPARISON:  None. FINDINGS: There is borderline cardiomegaly. Pulmonary vascularity is normal and the lungs are clear. No acute bone abnormality. Old nonunion fracture of the distal right clavicle. IMPRESSION: Borderline cardiomegaly.  No other significant abnormalities. Electronically Signed   By: Lorriane Shire M.D.   On: 07/21/2016 11:23      Subjective: - no chest pain, shortness of breath, no abdominal pain, nausea or vomiting. Asking to go home   Discharge Exam: Vitals:   07/26/16 0433 07/26/16 1139  BP: 131/90 107/72  Pulse: (!) 105 (!) 114  Resp: 18 18  Temp: 98.3 F (36.8 C) 98 F (36.7 C)   Vitals:   07/25/16 1734 07/25/16 1946 07/26/16 0433 07/26/16 1139  BP: 102/84 113/70 131/90 107/72  Pulse: (!) 105 97 (!) 105 (!) 114  Resp: '18 18 18 18  '$ Temp: 98 F (36.7 C) 98.5  F (36.9 C) 98.3 F (36.8 C) 98 F (36.7 C)  TempSrc: Oral Oral Oral Oral  SpO2: 99% 100% 97% 100%  Weight:   46 kg (101 lb 6.4 oz)   Height:        General: Pt is alert, awake, not in acute distress Cardiovascular: irregular, no MRG Respiratory: CTA bilaterally, no wheezing, no rhonchi Abdominal: Soft, NT, ND, bowel sounds + Extremities: no edema, no cyanosis    The results of significant diagnostics from this hospitalization (including imaging, microbiology, ancillary and laboratory) are listed below for reference.     Microbiology: Recent Results (from the past 240 hour(s))  Urine culture     Status: None   Collection Time: 07/21/16 10:51 AM  Result Value Ref Range Status   Specimen Description URINE, RANDOM  Final   Special Requests NONE  Final   Culture NO GROWTH  Final   Report Status  07/23/2016 FINAL  Final  Culture, blood (routine x 2)     Status: None   Collection Time: 07/21/16 11:50 AM  Result Value Ref Range Status   Specimen Description BLOOD RIGHT HAND  Final   Special Requests IN PEDIATRIC BOTTLE  3CC  Final   Culture NO GROWTH 5 DAYS  Final   Report Status 07/26/2016 FINAL  Final  Culture, blood (routine x 2)     Status: None   Collection Time: 07/21/16 11:55 AM  Result Value Ref Range Status   Specimen Description RIGHT ANTECUBITAL  Final   Special Requests IN PEDIATRIC BOTTLE  3CC  Final   Culture NO GROWTH 5 DAYS  Final   Report Status 07/26/2016 FINAL  Final  MRSA PCR Screening     Status: None   Collection Time: 07/21/16  6:01 PM  Result Value Ref Range Status   MRSA by PCR NEGATIVE NEGATIVE Final    Comment:        The GeneXpert MRSA Assay (FDA approved for NASAL specimens only), is one component of a comprehensive MRSA colonization surveillance program. It is not intended to diagnose MRSA infection nor to guide or monitor treatment for MRSA infections.   C difficile quick scan w PCR reflex     Status: None   Collection Time: 07/23/16  11:34 AM  Result Value Ref Range Status   C Diff antigen NEGATIVE NEGATIVE Final   C Diff toxin NEGATIVE NEGATIVE Final   C Diff interpretation No C. difficile detected.  Final     Labs: BNP (last 3 results)  Recent Labs  07/21/16 1746  BNP 5,681.2*   Basic Metabolic Panel:  Recent Labs Lab 07/21/16 1140 07/21/16 1206 07/22/16 0450 07/23/16 0245 07/25/16 1030  NA 139 140 139 139 140  K 4.7 4.7 4.8 3.8 3.7  CL 108 105 111 108 107  CO2 22  --  21* 24 24  GLUCOSE 94 90 86 101* 105*  BUN 19 22* 24* 22* 10  CREATININE 1.18* 1.20* 1.39* 1.31* 1.17*  CALCIUM 8.8*  --  7.9* 8.9 9.5  MG  --   --  1.6*  --   --    Liver Function Tests:  Recent Labs Lab 07/21/16 1140  AST 46*  ALT 33  ALKPHOS 83  BILITOT 0.6  PROT 6.6  ALBUMIN 3.9    Recent Labs Lab 07/21/16 1140  LIPASE 12   No results for input(s): AMMONIA in the last 168 hours. CBC:  Recent Labs Lab 07/21/16 1140 07/21/16 1206 07/22/16 0450 07/23/16 0245  WBC 4.8  --  3.7* 4.0  NEUTROABS 3.0  --   --   --   HGB 11.0* 11.2* 9.5* 9.7*  HCT 34.8* 33.0* 29.3* 29.9*  MCV 84.3  --  82.8 81.9  PLT 153  --  169 158   Cardiac Enzymes:  Recent Labs Lab 07/21/16 1746 07/21/16 2140 07/22/16 0450  TROPONINI <0.03 <0.03 0.07*   BNP: Invalid input(s): POCBNP CBG: No results for input(s): GLUCAP in the last 168 hours. D-Dimer No results for input(s): DDIMER in the last 72 hours. Hgb A1c No results for input(s): HGBA1C in the last 72 hours. Lipid Profile No results for input(s): CHOL, HDL, LDLCALC, TRIG, CHOLHDL, LDLDIRECT in the last 72 hours. Thyroid function studies  Recent Labs  07/24/16 1036  TSH 2.240  T3FREE 3.2   Anemia work up No results for input(s): VITAMINB12, FOLATE, FERRITIN, TIBC, IRON, RETICCTPCT in the last 72 hours. Urinalysis  Component Value Date/Time   COLORURINE AMBER (A) 07/21/2016 2104   APPEARANCEUR CLOUDY (A) 07/21/2016 2104   LABSPEC 1.023 07/21/2016 2104    PHURINE 5.0 07/21/2016 2104   GLUCOSEU NEGATIVE 07/21/2016 2104   HGBUR NEGATIVE 07/21/2016 2104   BILIRUBINUR NEGATIVE 07/21/2016 2104   [place] 07/21/2016 2104   PROTEINUR 100 (A) 07/21/2016 2104   NITRITE NEGATIVE 07/21/2016 2104   LEUKOCYTESUR MODERATE (A) 07/21/2016 2104   Sepsis Labs Invalid input(s): PROCALCITONIN,  WBC,  LACTICIDVEN Microbiology Recent Results (from the past 240 hour(s))  Urine culture     Status: None   Collection Time: 07/21/16 10:51 AM  Result Value Ref Range Status   Specimen Description URINE, RANDOM  Final   Special Requests NONE  Final   Culture NO GROWTH  Final   Report Status 07/23/2016 FINAL  Final  Culture, blood (routine x 2)     Status: None   Collection Time: 07/21/16 11:50 AM  Result Value Ref Range Status   Specimen Description BLOOD RIGHT HAND  Final   Special Requests IN PEDIATRIC BOTTLE  3CC  Final   Culture NO GROWTH 5 DAYS  Final   Report Status 07/26/2016 FINAL  Final  Culture, blood (routine x 2)     Status: None   Collection Time: 07/21/16 11:55 AM  Result Value Ref Range Status   Specimen Description RIGHT ANTECUBITAL  Final   Special Requests IN PEDIATRIC BOTTLE  3CC  Final   Culture NO GROWTH 5 DAYS  Final   Report Status 07/26/2016 FINAL  Final  MRSA PCR Screening     Status: None   Collection Time: 07/21/16  6:01 PM  Result Value Ref Range Status   MRSA by PCR NEGATIVE NEGATIVE Final    Comment:        The GeneXpert MRSA Assay (FDA approved for NASAL specimens only), is one component of a comprehensive MRSA colonization surveillance program. It is not intended to diagnose MRSA infection nor to guide or monitor treatment for MRSA infections.   C difficile quick scan w PCR reflex     Status: None   Collection Time: 07/23/16 11:34 AM  Result Value Ref Range Status   C Diff antigen NEGATIVE NEGATIVE Final   C Diff toxin NEGATIVE NEGATIVE Final   C Diff interpretation No C. difficile detected.  Final      Time coordinating discharge: Over 30 minutes  SIGNED:  Marzetta Board, MD  [facility] 07/26/2016, 6:13 PM Pager [phone]  If 7PM-7AM, please contact night-coverage [email or web address] Password [email or web address]

## 2016-07-26 NOTE — Progress Notes (Signed)
Patient Name: Brittany [name] Date of Encounter: 07/26/2016  Primary Cardiologist: new (Dr. Tamala Julian)  [facility] Problem List     Active Problems:   Cardiogenic shock [facility])   Syncope and collapse   Encounter for central line care   Hypotension   AKI (acute kidney injury) ([place])    Subjective   No complaints this morning. Wants to go home soon.  Inpatient Medications    Scheduled Meds: . amiodarone  400 mg Oral Q8H  . aspirin  81 mg Oral Daily  . donepezil  10 mg Oral QHS  . heparin subcutaneous  5,000 Units Subcutaneous Q8H  . lamoTRIgine  100 mg Oral BID  . levETIRAcetam  750 mg Oral BID  . lipase/protease/amylase  24,000 Units Oral TID WC  . memantine  10 mg Oral BID  . metoprolol tartrate  12.5 mg Oral BID  . QUEtiapine  12.5 mg Oral QPM   Continuous Infusions:  PRN Meds: sodium chloride   Vital Signs    Vitals:   07/25/16 1350 07/25/16 1734 07/25/16 1946 07/26/16 0433  BP:  102/84 113/70 131/90  Pulse: (!) 118 (!) 105 97 (!) 105  Resp:  '18 18 18  '$ Temp:  98 F (36.7 C) 98.5 F (36.9 C) 98.3 F (36.8 C)  TempSrc:  Oral Oral Oral  SpO2:  99% 100% 97%  Weight:    101 lb 6.4 oz (46 kg)  Height:        Intake/Output Summary (Last 24 hours) at 07/26/16 0844 Last data filed at 07/26/16 0434  Gross per 24 hour  Intake              360 ml  Output                0 ml  Net              360 ml   Filed Weights   07/24/16 0719 07/25/16 0459 07/26/16 0433  Weight: 102 lb 1.6 oz (46.3 kg) 102 lb 3.2 oz (46.4 kg) 101 lb 6.4 oz (46 kg)    Physical Exam    GEN: Frail. Well developed, in no acute distress.  HEENT: Grossly normal.  Neck: Supple, JVP 2cm above clavicle at 45 degrees, carotid bruits, or masses. Cardiac: Tachycardic.  Irregularly irregular. No murmurs, rubs, or gallops. No clubbing, cyanosis, edema.  Radials/DP/PT 2+ and equal bilaterally.  Respiratory:  Respirations regular and unlabored.  Bibasilar crackles.  GI: Soft, nontender, nondistended, BS  + x 4. MS: no deformity or atrophy. Skin: warm and dry, no rash. Neuro:  Strength and sensation are intact. Psych: AAOx3.  Normal affect.  Labs    CBC No results for input(s): WBC, NEUTROABS, HGB, HCT, MCV, PLT in the last 72 hours. Basic Metabolic Panel  Recent Labs  07/25/16 1030  NA 140  K 3.7  CL 107  CO2 24  GLUCOSE 105*  BUN 10  CREATININE 1.17*  CALCIUM 9.5   Liver Function Tests No results for input(s): AST, ALT, ALKPHOS, BILITOT, PROT, ALBUMIN in the last 72 hours. No results for input(s): LIPASE, AMYLASE in the last 72 hours. Cardiac Enzymes No results for input(s): CKTOTAL, CKMB, CKMBINDEX, TROPONINI in the last 72 hours. BNP Invalid input(s): POCBNP D-Dimer No results for input(s): DDIMER in the last 72 hours. Hemoglobin A1C No results for input(s): HGBA1C in the last 72 hours. Fasting Lipid Panel No results for input(s): CHOL, HDL, LDLCALC, TRIG, CHOLHDL, LDLDIRECT in the last 72 hours. Thyroid Function  Tests  Recent Labs  07/24/16 1036  TSH 2.240  T3FREE 3.2    Telemetry    Atrial flutter.  Rates 90s-130s.  Currently 106 bpm.  - Personally Reviewed  ECG    n/a - Personally Reviewed  Radiology    No results found.  Cardiac Studies   Echo 07/21/16: Study Conclusions  - Left ventricle: The cavity size was normal. Wall thickness was   increased in a pattern of severe LVH. Systolic function was   normal. The estimated ejection fraction was in the range of 50%   to 55%. The study is not technically sufficient to allow   evaluation of LV diastolic function. - Mitral valve: There was moderate regurgitation. - Left atrium: The atrium was moderately dilated. - Right atrium: The atrium was moderately dilated. - Atrial septum: No defect or patent foramen ovale was identified. - Pulmonary arteries: PA peak pressure: 53 mm Hg (S).  Patient Profile     Brittany [name] is a 28F with chronic diastolic heart failure, hypertensive heart disease, CKD  III, COPD, alcoholism, and PAD here with weakness and dizziness.  Noted to be in atrial fibrillation with RVR.  Assessment & Plan    # Persistent atrial flutter:  Heart rates remain poorly-controlled.  CHADSvasc 6.  She is not on anticoagulation due to falls.  Started on amiodarone 07/23/16 due to poorly controlled rates and low BP.  Amiodarone was increased to '400mg'$  q8h on 1/8 with plans for a 5g load.  Then '200mg'$  daily.  Heart rates remain elevated. Metoprolol 12.'5mg'$  q12h added yesterday with some improvement.  Phenylephrine was stopped 1/6.  Thyroid function was normal.  She will need PFTs as an outpatient.  -- Room to further increase BB if rates remain elevated.  # Hypotension: Baseline BP in the 90s but now in the 110s.  Blood pressure has tolerated the addition of BB.  # Moderate pulmonary hypertension  # Chronic diastolic heart failure: PASP 53 mmHg on echo.  She is mildly volume overloaded mild basilar crackles.  However she is not in respiratory distress, weight is stable and was on pressors previously.  We will not diurese for now.   Signed, Reino Bellis, NP  07/26/2016, 8:44 AM

## 2016-07-26 NOTE — Progress Notes (Signed)
Physical Therapy Treatment Patient Details Name: Brittany [name] MRN: [id number] DOB: [birth year] Today's Date: August 04, 2016    History of Present Illness 61 y.o lady with past medical history significant for seizures  brought to ED after experiencing generalized weakness and dizziness found to be hypotensive upon arrival.    PT Comments    Patient seen for mobility progression. Tolerated increased activity today with stable HR and VS. Patient very pleasant. Improved stability with use of bilateral foot orthotic shoes. 2 standing rest breaks. Utilized music during session for patient comfort. Patient very appreciative. Patient eager to go home. Will continue to see and progress as tolerated.  Follow Up Recommendations  Supervision for mobility/OOB     Equipment Recommendations  None recommended by PT    Recommendations for Other Services       Precautions / Restrictions Precautions Precautions: Fall Precaution Comments: watch HR  Restrictions Weight Bearing Restrictions: No    Mobility  Bed Mobility               General bed mobility comments: received in chair  Transfers Overall transfer level: Needs assistance Equipment used: None Transfers: Sit to/from Stand Sit to Stand: Supervision         General transfer comment: no physical assist required  Ambulation/Gait Ambulation/Gait assistance: Min guard Ambulation Distance (Feet): 240 Feet Assistive device: None Gait Pattern/deviations: Step-through pattern;Decreased stride length;Narrow base of support Gait velocity: decreased Gait velocity interpretation: Below normal speed for age/gender General Gait Details: Improved gait with use of bilateral orthotic shoes. 2 standing rest breaks.    Stairs            Wheelchair Mobility    Modified Rankin (Stroke Patients Only)       Balance Overall balance assessment: History of Falls                                  Cognition  Arousal/Alertness: Awake/alert Behavior During Therapy: WFL for tasks assessed/performed Overall Cognitive Status: No family/caregiver present to determine baseline cognitive functioning                      Exercises      General Comments        Pertinent Vitals/Pain Pain Assessment: No/denies pain    Home Living                      Prior Function            PT Goals (current goals can now be found in the care plan section) Acute Rehab PT Goals Patient Stated Goal: to go home PT Goal Formulation: With patient Time For Goal Achievement: 08/08/16 Potential to Achieve Goals: Good Progress towards PT goals: Progressing toward goals    Frequency    Min 3X/week      PT Plan Current plan remains appropriate    Co-evaluation             End of Session Equipment Utilized During Treatment: Gait belt Activity Tolerance: Patient tolerated treatment well Patient left: in chair;with call bell/phone within reach;with chair alarm set     Time: 1500-1520 PT Time Calculation (min) (ACUTE ONLY): 20 min  Charges:  $Gait Training: 8-22 mins                    G Codes:      Duncan Dull August 04, 2016, 5:10  PM Alben Deeds, [place] DPT  [phone]

## 2016-07-26 NOTE — Progress Notes (Signed)
Pt very irritable last night because she wants to be discharged. Pt states, "they're just experimenting on me" and "I ain't gaining any weight". Pt encouraged to stay and plan of care reviewed with patient. Will continue to monitor.

## 2016-07-28 LAB — OVA + PARASITE EXAM

## 2016-07-28 LAB — O&P RESULT

## 2016-08-03 NOTE — Progress Notes (Deleted)
Cardiology Office Note    Date:  08/03/2016   ID:  Brittany [name], DOB 10-05-1955, MRN [id number]  PCP:  No PCP Per Patient  Cardiologist: Dr. Tamala Julian   CC: post hospital follow up.   History of Present Illness:  Brittany [name] is a 61 y.o. female with a history of dementia, seizures, gluacoma, pancreatitis, alcohol abuse, CHF (unknown type), PAF (not on [place]), CVA, CKD III, COPD and PAD who presents to clinic for post hospital follow up.   Recently admitted from 1/5-1/10/18 for hypotension and atrial flutter. She was felt to have cardiogenic shock requiring brief pressors. 2D ECHO showed normal LV function (EF 50-55%), severe LVH, mod MR, mod biatrial enlargement, Pa pressure 53. BBs increased 2/2 aflutter with RVR and started on amiodarone load. Not placed on oral anticoagulation given high risk for bleeding and falls.    Today she presents to clinic for follow up.     Past Medical History:  Diagnosis Date  . Atrial fibrillation ([place])   . CHF (congestive heart failure) ([place])   . CVA (cerebral vascular accident) ([place])   . 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 HTN (hypertension)   . PAD (peripheral artery disease) ([place])   . Pancreatitis   . Seizures ([place])     Past Surgical History:  Procedure Laterality Date  . CENTRAL LINE INSERTION  07/21/2016        Current Medications: Outpatient Medications Prior to Visit  Medication Sig Dispense Refill  . acetaminophen (TYLENOL) 500 MG tablet Take 500-1,000 mg by mouth 2 (two) times daily as needed (for pain).    Marland Kitchen amiodarone (PACERONE) 200 MG tablet Take 1 tablet (200 mg total) by mouth daily. Start taking 07/30/15 30 tablet 1  . amiodarone (PACERONE) 400 MG tablet Take 1 tablet (400 mg total) by mouth every 8 (eight) hours. 9 tablet 0  . aspirin EC 325 MG tablet Take 325 mg by mouth daily.    . Cholecalciferol (VITAMIN D3) 2000 units TABS Take 2,000 Units by mouth daily.    Marland Kitchen donepezil (ARICEPT) 10 MG tablet Take 10 mg by mouth at  bedtime.    . lamoTRIgine (LAMICTAL) 100 MG tablet Take 100 mg by mouth 2 (two) times daily.    Marland Kitchen latanoprost (XALATAN) 0.005 % ophthalmic solution Place 1 drop into both eyes at bedtime.    . levETIRAcetam (KEPPRA) 750 MG tablet Take 750 mg by mouth 2 (two) times daily.    . memantine (NAMENDA) 10 MG tablet Take 10 mg by mouth 2 (two) times daily.    . metoprolol tartrate (LOPRESSOR) 25 MG tablet Take 0.5 tablets (12.5 mg total) by mouth 2 (two) times daily. 60 tablet 1  . Nutritional Supplements (FEEDING SUPPLEMENT, VITAL 1.5 CAL,) LIQD Take 237 mLs by mouth See admin instructions. TWO TO THREE TIMES A DAY    . Pancrelipase, Lip-Prot-Amyl, (CREON) 24000-76000 units CPEP Take 1 capsule by mouth 3 (three) times daily with meals. FOR DIGESTION    . QUEtiapine (SEROQUEL) 25 MG tablet Take 12.5 mg by mouth every evening.     No facility-administered medications prior to visit.      Allergies:   Patient has no known allergies.   Social History   Social History  . Marital status: Widowed    Spouse name: N/A  . Number of children: N/A  . Years of education: N/A   Social History Main Topics  . Smoking status: Former Research scientist (life sciences)  . Smokeless tobacco: Never Used  .  Alcohol use Yes  . Drug use: No  . Sexual activity: Not on file   Other Topics Concern  . Not on file   Social History Narrative  . No narrative on file     Family History:  The patient's family history includes Hypertension in her father.      *** ROS/PE    Wt Readings from Last 3 Encounters:  07/26/16 101 lb 6.4 oz (46 kg)      Studies/Labs Reviewed:   EKG:  EKG is*** ordered today.  The ekg ordered today demonstrates ***  Recent Labs: 07/21/2016: ALT 33; B Natriuretic Peptide 2,207.7 07/22/2016: Magnesium 1.6 07/23/2016: Hemoglobin 9.7; Platelets 158 07/24/2016: TSH 2.240 07/25/2016: BUN 10; Creatinine, Ser 1.17; Potassium 3.7; Sodium 140   Lipid Panel No results found for: CHOL, TRIG, HDL, CHOLHDL, VLDL, LDLCALC,  LDLDIRECT  Additional studies/ records that were reviewed today include:  Echo 07/21/16: Study Conclusions - Left ventricle: The cavity size was normal. Wall thickness was increased in a pattern of severe LVH. Systolic function was normal. The estimated ejection fraction was in the range of 50% to 55%. The study is not technically sufficient to allow evaluation of LV diastolic function. - Mitral valve: There was moderate regurgitation. - Left atrium: The atrium was moderately dilated. - Right atrium: The atrium was moderately dilated. - Atrial septum: No defect or patent foramen ovale was identified. - Pulmonary arteries: PA peak pressure: 53 mm Hg (S).   ASSESSMENT & PLAN:   PAFib/flutter:  Chronic diastolic CHF:  Hypertensive heart disease with heart failure:  Pulm HTN:  Dementia:  PAD:   Medication Adjustments/Labs and Tests Ordered: Current medicines are reviewed at length with the patient today.  Concerns regarding medicines are outlined above.  Medication changes, Labs and Tests ordered today are listed in the Patient Instructions below. There are no Patient Instructions on file for this visit.   Signed, Angelena Form, PA-C  08/03/2016 10:46 AM    [facility] [address], [place], [place]  [address] Phone: [phone]; Fax: [phone]

## 2016-08-04 ENCOUNTER — Emergency Department (HOSPITAL_COMMUNITY): Payer: Medicare (Managed Care)

## 2016-08-04 ENCOUNTER — Inpatient Hospital Stay (HOSPITAL_COMMUNITY)
Admission: EM | Admit: 2016-08-04 | Discharge: 2016-08-09 | DRG: 291 | Disposition: A | Discharge status: Home / Self Care | Payer: Medicare (Managed Care) | Attending: Family Medicine | Admitting: Family Medicine

## 2016-08-04 ENCOUNTER — Encounter: Payer: Medicaid Other | Admitting: Physician Assistant

## 2016-08-04 ENCOUNTER — Encounter (HOSPITAL_COMMUNITY): Payer: Self-pay | Admitting: Emergency Medicine

## 2016-08-04 DIAGNOSIS — T501X5A Adverse effect of loop [high-ceiling] diuretics, initial encounter: Secondary | ICD-10-CM | POA: Diagnosis not present

## 2016-08-04 DIAGNOSIS — Z681 Body mass index (BMI) 19 or less, adult: Secondary | ICD-10-CM | POA: Diagnosis not present

## 2016-08-04 DIAGNOSIS — F1097 Alcohol use, unspecified with alcohol-induced persisting dementia: Secondary | ICD-10-CM | POA: Diagnosis not present

## 2016-08-04 DIAGNOSIS — F1027 Alcohol dependence with alcohol-induced persisting dementia: Secondary | ICD-10-CM | POA: Diagnosis present

## 2016-08-04 DIAGNOSIS — I130 Hypertensive heart and chronic kidney disease with heart failure and stage 1 through stage 4 chronic kidney disease, or unspecified chronic kidney disease: Principal | ICD-10-CM | POA: Diagnosis present

## 2016-08-04 DIAGNOSIS — I340 Nonrheumatic mitral (valve) insufficiency: Secondary | ICD-10-CM | POA: Diagnosis present

## 2016-08-04 DIAGNOSIS — I483 Typical atrial flutter: Secondary | ICD-10-CM | POA: Diagnosis not present

## 2016-08-04 DIAGNOSIS — I739 Peripheral vascular disease, unspecified: Secondary | ICD-10-CM | POA: Diagnosis present

## 2016-08-04 DIAGNOSIS — Z8673 Personal history of transient ischemic attack (TIA), and cerebral infarction without residual deficits: Secondary | ICD-10-CM

## 2016-08-04 DIAGNOSIS — G40909 Epilepsy, unspecified, not intractable, without status epilepticus: Secondary | ICD-10-CM | POA: Diagnosis present

## 2016-08-04 DIAGNOSIS — I5033 Acute on chronic diastolic (congestive) heart failure: Secondary | ICD-10-CM | POA: Diagnosis present

## 2016-08-04 DIAGNOSIS — J810 Acute pulmonary edema: Secondary | ICD-10-CM

## 2016-08-04 DIAGNOSIS — I5031 Acute diastolic (congestive) heart failure: Secondary | ICD-10-CM

## 2016-08-04 DIAGNOSIS — F0390 Unspecified dementia without behavioral disturbance: Secondary | ICD-10-CM | POA: Diagnosis present

## 2016-08-04 DIAGNOSIS — I4892 Unspecified atrial flutter: Secondary | ICD-10-CM | POA: Diagnosis not present

## 2016-08-04 DIAGNOSIS — N170 Acute kidney failure with tubular necrosis: Secondary | ICD-10-CM | POA: Diagnosis not present

## 2016-08-04 DIAGNOSIS — Z87891 Personal history of nicotine dependence: Secondary | ICD-10-CM

## 2016-08-04 DIAGNOSIS — I161 Hypertensive emergency: Secondary | ICD-10-CM

## 2016-08-04 DIAGNOSIS — E43 Unspecified severe protein-calorie malnutrition: Secondary | ICD-10-CM | POA: Diagnosis present

## 2016-08-04 DIAGNOSIS — N183 Chronic kidney disease, stage 3 unspecified: Secondary | ICD-10-CM | POA: Diagnosis present

## 2016-08-04 DIAGNOSIS — Z9111 Patient's noncompliance with dietary regimen: Secondary | ICD-10-CM | POA: Diagnosis not present

## 2016-08-04 DIAGNOSIS — R0602 Shortness of breath: Secondary | ICD-10-CM | POA: Diagnosis not present

## 2016-08-04 DIAGNOSIS — T502X5A Adverse effect of carbonic-anhydrase inhibitors, benzothiadiazides and other diuretics, initial encounter: Secondary | ICD-10-CM | POA: Diagnosis not present

## 2016-08-04 DIAGNOSIS — I482 Chronic atrial fibrillation: Secondary | ICD-10-CM | POA: Diagnosis present

## 2016-08-04 DIAGNOSIS — R569 Unspecified convulsions: Secondary | ICD-10-CM | POA: Diagnosis not present

## 2016-08-04 DIAGNOSIS — J449 Chronic obstructive pulmonary disease, unspecified: Secondary | ICD-10-CM | POA: Diagnosis present

## 2016-08-04 DIAGNOSIS — Z7982 Long term (current) use of aspirin: Secondary | ICD-10-CM | POA: Diagnosis not present

## 2016-08-04 DIAGNOSIS — Z8249 Family history of ischemic heart disease and other diseases of the circulatory system: Secondary | ICD-10-CM | POA: Diagnosis not present

## 2016-08-04 DIAGNOSIS — I5043 Acute on chronic combined systolic (congestive) and diastolic (congestive) heart failure: Secondary | ICD-10-CM

## 2016-08-04 DIAGNOSIS — J441 Chronic obstructive pulmonary disease with (acute) exacerbation: Secondary | ICD-10-CM

## 2016-08-04 HISTORY — DX: Unspecified atrial flutter: I48.92

## 2016-08-04 HISTORY — DX: Chronic obstructive pulmonary disease, unspecified: J44.9

## 2016-08-04 HISTORY — DX: Unspecified diastolic (congestive) heart failure: I50.30

## 2016-08-04 LAB — COMPREHENSIVE METABOLIC PANEL
ALBUMIN: 4.1 g/dL (ref 3.5–5.0)
ALT: 28 U/L (ref 14–54)
AST: 38 U/L (ref 15–41)
Alkaline Phosphatase: 84 U/L (ref 38–126)
Anion gap: 7 (ref 5–15)
BUN: 23 mg/dL — AB (ref 6–20)
CHLORIDE: 102 mmol/L (ref 101–111)
CO2: 26 mmol/L (ref 22–32)
Calcium: 9 mg/dL (ref 8.9–10.3)
Creatinine, Ser: 1.39 mg/dL — ABNORMAL HIGH (ref 0.44–1.00)
GFR calc Af Amer: 47 mL/min — ABNORMAL LOW (ref >60)
GFR, EST NON AFRICAN AMERICAN: 41 mL/min — AB (ref >60)
Glucose, Bld: 95 mg/dL (ref 65–99)
POTASSIUM: 4.9 mmol/L (ref 3.5–5.1)
SODIUM: 135 mmol/L (ref 135–145)
Total Bilirubin: 0.8 mg/dL (ref 0.3–1.2)
Total Protein: 7.4 g/dL (ref 6.5–8.1)

## 2016-08-04 LAB — CBC
HCT: 34.2 % — ABNORMAL LOW (ref 36.0–46.0)
HCT: 37.4 % (ref 36.0–46.0)
HEMOGLOBIN: 11.9 g/dL — AB (ref 12.0–15.0)
Hemoglobin: 11.2 g/dL — ABNORMAL LOW (ref 12.0–15.0)
MCH: 27.4 pg (ref 26.0–34.0)
MCH: 26.4 pg (ref 26.0–34.0)
MCHC: 32.7 g/dL (ref 30.0–36.0)
MCHC: 31.8 g/dL (ref 30.0–36.0)
MCV: 83.6 fL (ref 78.0–100.0)
MCV: 82.9 fL (ref 78.0–100.0)
Platelets: 329 10*3/uL (ref 150–400)
Platelets: 352 10*3/uL (ref 150–400)
RBC: 4.09 MIL/uL (ref 3.87–5.11)
RBC: 4.51 MIL/uL (ref 3.87–5.11)
RDW: 15.2 % (ref 11.5–15.5)
RDW: 15.2 % (ref 11.5–15.5)
WBC: 5.3 10*3/uL (ref 4.0–10.5)
WBC: 6 10*3/uL (ref 4.0–10.5)

## 2016-08-04 LAB — CREATININE, SERUM
Creatinine, Ser: 1.3 mg/dL — ABNORMAL HIGH (ref 0.44–1.00)
GFR, EST AFRICAN AMERICAN: 51 mL/min — AB (ref >60)
GFR, EST NON AFRICAN AMERICAN: 44 mL/min — AB (ref >60)

## 2016-08-04 LAB — I-STAT TROPONIN, ED: TROPONIN I, POC: 0.01 ng/mL (ref 0.00–0.08)

## 2016-08-04 LAB — BRAIN NATRIURETIC PEPTIDE: B NATRIURETIC PEPTIDE 5: 1367.2 pg/mL — AB (ref 0.0–100.0)

## 2016-08-04 MED ORDER — VITAMINS A & D EX OINT
TOPICAL_OINTMENT | CUTANEOUS | Status: AC
  Administered 2016-08-04: 20:00:00
  Filled 2016-08-04: qty 5

## 2016-08-04 MED ORDER — ACETAMINOPHEN 650 MG RE SUPP: 650.0000 mg | Freq: Four times a day (QID) | RECTAL | Status: DC | PRN

## 2016-08-04 MED ORDER — VITAMIN D3 25 MCG (1000 UNIT) PO TABS
2000.0000 [IU] | ORAL_TABLET | Freq: Every day | ORAL | Status: DC
  Administered 2016-08-04 – 2016-08-09 (×6): 2000 [IU] via ORAL
  Filled 2016-08-04 (×6): qty 2

## 2016-08-04 MED ORDER — LATANOPROST 0.005 % OP SOLN
1.0000 [drp] | Freq: Every day | OPHTHALMIC | Status: DC
  Administered 2016-08-05 – 2016-08-08 (×5): 1 [drp] via OPHTHALMIC
  Filled 2016-08-04: qty 2.5

## 2016-08-04 MED ORDER — ACETAMINOPHEN 325 MG PO TABS: 650.0000 mg | ORAL_TABLET | Freq: Four times a day (QID) | ORAL | Status: DC | PRN

## 2016-08-04 MED ORDER — ONDANSETRON HCL 4 MG/2ML IJ SOLN: 4.0000 mg | Freq: Four times a day (QID) | INTRAMUSCULAR | Status: DC | PRN

## 2016-08-04 MED ORDER — PANCRELIPASE (LIP-PROT-AMYL) 12000-38000 UNITS PO CPEP
12000.0000 [IU] | ORAL_CAPSULE | Freq: Three times a day (TID) | ORAL | Status: DC
  Administered 2016-08-04 – 2016-08-09 (×14): 12000 [IU] via ORAL
  Filled 2016-08-04 (×14): qty 1

## 2016-08-04 MED ORDER — IPRATROPIUM BROMIDE 0.02 % IN SOLN
0.5000 mg | Freq: Once | RESPIRATORY_TRACT | Status: AC
  Administered 2016-08-04: 0.5 mg via RESPIRATORY_TRACT
  Filled 2016-08-04: qty 2.5

## 2016-08-04 MED ORDER — HYDROCHLOROTHIAZIDE 25 MG PO TABS
25.0000 mg | ORAL_TABLET | Freq: Every day | ORAL | Status: DC
  Administered 2016-08-04 – 2016-08-06 (×3): 25 mg via ORAL
  Filled 2016-08-04 (×3): qty 1

## 2016-08-04 MED ORDER — ENOXAPARIN SODIUM 30 MG/0.3ML ~~LOC~~ SOLN
30.0000 mg | SUBCUTANEOUS | Status: DC
  Administered 2016-08-04 – 2016-08-08 (×5): 30 mg via SUBCUTANEOUS
  Filled 2016-08-04 (×5): qty 0.3

## 2016-08-04 MED ORDER — VITAL 1.5 CAL PO LIQD
237.0000 mL | Freq: Two times a day (BID) | ORAL | Status: DC
  Filled 2016-08-04: qty 237

## 2016-08-04 MED ORDER — SODIUM CHLORIDE 0.9 % IV SOLN: 250.0000 mL | INTRAVENOUS | Status: DC | PRN

## 2016-08-04 MED ORDER — LAMOTRIGINE 100 MG PO TABS
100.0000 mg | ORAL_TABLET | Freq: Two times a day (BID) | ORAL | Status: DC
  Administered 2016-08-04 – 2016-08-09 (×10): 100 mg via ORAL
  Filled 2016-08-04 (×10): qty 1

## 2016-08-04 MED ORDER — ALBUTEROL SULFATE (2.5 MG/3ML) 0.083% IN NEBU
5.0000 mg | INHALATION_SOLUTION | Freq: Once | RESPIRATORY_TRACT | Status: AC
  Administered 2016-08-04: 5 mg via RESPIRATORY_TRACT
  Filled 2016-08-04: qty 6

## 2016-08-04 MED ORDER — ASPIRIN EC 325 MG PO TBEC
325.0000 mg | DELAYED_RELEASE_TABLET | Freq: Every day | ORAL | Status: DC
  Administered 2016-08-04 – 2016-08-09 (×6): 325 mg via ORAL
  Filled 2016-08-04 (×6): qty 1

## 2016-08-04 MED ORDER — SODIUM CHLORIDE 0.9% FLUSH
3.0000 mL | Freq: Two times a day (BID) | INTRAVENOUS | Status: DC
  Administered 2016-08-04 – 2016-08-09 (×9): 3 mL via INTRAVENOUS

## 2016-08-04 MED ORDER — MEMANTINE HCL 10 MG PO TABS
10.0000 mg | ORAL_TABLET | Freq: Two times a day (BID) | ORAL | Status: DC
  Administered 2016-08-04 – 2016-08-09 (×10): 10 mg via ORAL
  Filled 2016-08-04 (×10): qty 1

## 2016-08-04 MED ORDER — BOOST / RESOURCE BREEZE PO LIQD
1.0000 | Freq: Every day | ORAL | Status: DC
  Administered 2016-08-05 – 2016-08-09 (×5): 1 via ORAL
  Filled 2016-08-04: qty 1

## 2016-08-04 MED ORDER — FUROSEMIDE 10 MG/ML IJ SOLN
40.0000 mg | Freq: Two times a day (BID) | INTRAMUSCULAR | Status: DC
  Administered 2016-08-04 – 2016-08-05 (×2): 40 mg via INTRAVENOUS
  Filled 2016-08-04 (×2): qty 4

## 2016-08-04 MED ORDER — HYDRALAZINE HCL 20 MG/ML IJ SOLN: 10.0000 mg | INTRAMUSCULAR | Status: DC | PRN

## 2016-08-04 MED ORDER — AMIODARONE HCL 200 MG PO TABS
200.0000 mg | ORAL_TABLET | Freq: Every day | ORAL | Status: DC
  Administered 2016-08-04 – 2016-08-09 (×6): 200 mg via ORAL
  Filled 2016-08-04 (×6): qty 1

## 2016-08-04 MED ORDER — SODIUM CHLORIDE 0.9% FLUSH
3.0000 mL | Freq: Two times a day (BID) | INTRAVENOUS | Status: DC
  Administered 2016-08-04 – 2016-08-05 (×2): 3 mL via INTRAVENOUS

## 2016-08-04 MED ORDER — METOPROLOL TARTRATE 25 MG PO TABS
25.0000 mg | ORAL_TABLET | Freq: Two times a day (BID) | ORAL | Status: DC
  Administered 2016-08-04 – 2016-08-08 (×3): 25 mg via ORAL
  Filled 2016-08-04 (×9): qty 1

## 2016-08-04 MED ORDER — QUETIAPINE FUMARATE 25 MG PO TABS
12.5000 mg | ORAL_TABLET | Freq: Every evening | ORAL | Status: DC
Start: 2016-08-04 — End: 2016-08-09
  Administered 2016-08-04 – 2016-08-08 (×5): 12.5 mg via ORAL
  Filled 2016-08-04 (×5): qty 1

## 2016-08-04 MED ORDER — SODIUM CHLORIDE 0.9% FLUSH: 3.0000 mL | INTRAVENOUS | Status: DC | PRN

## 2016-08-04 MED ORDER — LEVETIRACETAM 250 MG PO TABS
750.0000 mg | ORAL_TABLET | Freq: Two times a day (BID) | ORAL | Status: DC
  Administered 2016-08-04 – 2016-08-05 (×3): 750 mg via ORAL
  Administered 2016-08-06: 250 mg via ORAL
  Administered 2016-08-06 – 2016-08-09 (×6): 750 mg via ORAL
  Filled 2016-08-04 (×9): qty 1

## 2016-08-04 MED ORDER — DONEPEZIL HCL 10 MG PO TABS
10.0000 mg | ORAL_TABLET | Freq: Every day | ORAL | Status: DC
  Administered 2016-08-04 – 2016-08-08 (×5): 10 mg via ORAL
  Filled 2016-08-04 (×6): qty 1

## 2016-08-04 MED ORDER — FUROSEMIDE 10 MG/ML IJ SOLN
20.0000 mg | Freq: Once | INTRAMUSCULAR | Status: AC
  Administered 2016-08-04: 20 mg via INTRAVENOUS
  Filled 2016-08-04: qty 4

## 2016-08-04 MED ORDER — ONDANSETRON HCL 4 MG PO TABS: 4.0000 mg | ORAL_TABLET | Freq: Four times a day (QID) | ORAL | Status: DC | PRN

## 2016-08-04 MED ORDER — OSMOLITE 1.5 CAL PO LIQD
237.0000 mL | Freq: Two times a day (BID) | ORAL | Status: DC
  Administered 2016-08-05 – 2016-08-07 (×6): 237 mL via ORAL
  Filled 2016-08-04 (×6): qty 237

## 2016-08-04 NOTE — H&P (Signed)
History and Physical    Brittany [name] VOH:[id number] DOB: [birth year] DOA: 08/04/2016  PCP: Pcp Not In System   Patient coming from: Home  Chief Complaint: Dyspnea  HPI: Brittany [name] is a 61 y.o. female with medical history significant of diastolic heart failure, who presents with the chief complain of worsening dyspnea. Recent hospitalization with discharge diagnosis of shock and hypotension associated with diarrhea and uncontrolled atrial fibrillation.  She has been at her usual state of health until this am when abruptly felt dyspnea, PND. Severe in intensity with no improving or worsening factors, no associated chest pain or palpitations. No lower extremity edema.  She uses a walker for ambulation.  Patient admitted not being compliant with her medications or a salt restricted diet.   ED Course: Apparently EMS found her hypoxic. Patient was found volume overload, started with diuresis with improvement of her symptoms. Refer for further admission and evaluation.  Review of Systems: 1. General. No fevers or chills, no waking a weight loss 2. ENT. No runny nose or sore throat 3. Pulmonary positive for dyspnea as mentioned in history present illness, no cough, wheezing or hemoptysis 4. Cardiovascular. Denies any chest pain, claudication, no palpitations. No lower extremity edema 5. Gastrointestinal, no nausea, vomiting or diarrhea 6. Musculoskeletal, no joint 7. Dermatology no rashes 8. Urology no dysuria or increased frequency 9. Hematology no easy bruisability or frequent infections 10. Neurology, no seizures or paresthesias  Past Medical History:  Diagnosis Date  . Atrial fibrillation ([place])   . CHF (congestive heart failure) ([place])   . COPD (chronic obstructive pulmonary disease) ([place])   . CVA (cerebral vascular accident) ([place])   . 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 HTN (hypertension)   . PAD (peripheral artery disease) ([place])   . Pancreatitis   . Seizures ([place])     Past  Surgical History:  Procedure Laterality Date  . CENTRAL LINE INSERTION  07/21/2016         reports that she has quit smoking. She has never used smokeless tobacco. She reports that she drinks alcohol. She reports that she does not use drugs.  No Known Allergies  Family History  Problem Relation Age of Onset  . Hypertension Father    Unacceptable: Noncontributory, unremarkable, or negative. Acceptable: Family history reviewed and not pertinent (If you reviewed it)  Prior to Admission medications   Medication Sig Start Date End Date Taking? Authorizing Provider  acetaminophen (TYLENOL) 500 MG tablet Take 1,000 mg by mouth 2 (two) times daily as needed (for various aches and pains).    Yes Historical Provider, MD  amiodarone (PACERONE) 200 MG tablet Take 1 tablet (200 mg total) by mouth daily. Start taking 07/30/15 07/26/16  Yes Costin Karlyne Greenspan, MD  aspirin EC 325 MG tablet Take 325 mg by mouth daily.   Yes Historical Provider, MD  donepezil (ARICEPT) 10 MG tablet Take 10 mg by mouth at bedtime.   Yes Historical Provider, MD  lamoTRIgine (LAMICTAL) 100 MG tablet Take 100 mg by mouth 2 (two) times daily.   Yes Historical Provider, MD  latanoprost (XALATAN) 0.005 % ophthalmic solution Place 1 drop into both eyes at bedtime.   Yes Historical Provider, MD  levETIRAcetam (KEPPRA) 750 MG tablet Take 750 mg by mouth 2 (two) times daily.   Yes Historical Provider, MD  memantine (NAMENDA) 10 MG tablet Take 10 mg by mouth 2 (two) times daily.   Yes Historical Provider, MD  metoprolol tartrate (LOPRESSOR) 25 MG tablet Take  0.5 tablets (12.5 mg total) by mouth 2 (two) times daily. Patient taking differently: Take 25 mg by mouth 2 (two) times daily.  07/26/16  Yes Costin Karlyne Greenspan, MD  Nutritional Supplements (ENSURE CLEAR) LIQD Take 1 Can by mouth daily.   Yes Historical Provider, MD  Nutritional Supplements (FEEDING SUPPLEMENT, VITAL 1.5 CAL,) LIQD Take 237 mLs by mouth 2 (two) times daily.    Yes  Historical Provider, MD  Pancrelipase, Lip-Prot-Amyl, (CREON) 24000-76000 units CPEP Take 1 capsule by mouth 3 (three) times daily with meals. FOR DIGESTION   Yes Historical Provider, MD  QUEtiapine (SEROQUEL) 25 MG tablet Take 12.5 mg by mouth every evening.   Yes Historical Provider, MD  amiodarone (PACERONE) 400 MG tablet Take 1 tablet (400 mg total) by mouth every 8 (eight) hours. Patient not taking: Reported on 08/04/2016 07/26/16 07/29/16  Caren Griffins, MD  Cholecalciferol (VITAMIN D3) 2000 units TABS Take 2,000 Units by mouth daily.    Historical Provider, MD    Physical Exam: Vitals:   08/04/16 1143 08/04/16 1416  BP: (!) 129/107 (!) 140/101  Pulse: 99 98  Resp: 22 22  Temp: 97.8 F (36.6 C)   TempSrc: Oral   SpO2: 100% 100%  Weight: 45.8 kg (101 lb)   Height: 5' 3.5" (1.613 m)       Constitutional: not in pain or dyspnea  Vitals:   08/04/16 1143 08/04/16 1416  BP: (!) 129/107 (!) 140/101  Pulse: 99 98  Resp: 22 22  Temp: 97.8 F (36.6 C)   TempSrc: Oral   SpO2: 100% 100%  Weight: 45.8 kg (101 lb)   Height: 5' 3.5" (1.613 m)    Eyes: PERRL, lids and conjunctivae mild pale but no icterus.  Head normocephalic, nose and ears with no deformities.  ENMT: Mucous membranes are moist. Posterior pharynx clear of any exudate or lesions.Normal dentition.  Neck: normal, supple, no masses, no thyromegaly Respiratory: decreased breath sounds on auscultation bilaterally at bases, no wheezing. Positive diffuse rales bilaterally. Normal respiratory effort. No accessory muscle use.  Cardiovascular: Regular rate and rhythm, no murmurs / rubs / gallops. No extremity edema. 2+ pedal pulses. No carotid bruits. Positive moderate JVD.  Abdomen: no tenderness, no masses palpated. No hepatosplenomegaly. Bowel sounds positive.  Musculoskeletal: no clubbing / cyanosis. No joint deformity upper and lower extremities. Good ROM, no contractures. Normal muscle tone.  Skin: no rashes, lesions,  ulcers. No induration Neurologic: CN 2-12 grossly intact. Sensation intact, DTR normal. Strength 5/5 in all 4.     Labs on Admission: I have personally reviewed following labs and imaging studies  CBC:  Recent Labs Lab 08/04/16 1245  WBC 5.3  HGB 11.2*  HCT 34.2*  MCV 83.6  PLT 009   Basic Metabolic Panel:  Recent Labs Lab 08/04/16 1245  NA 135  K 4.9  CL 102  CO2 26  GLUCOSE 95  BUN 23*  CREATININE 1.39*  CALCIUM 9.0   GFR: Estimated Creatinine Clearance: 31.9 mL/min (by C-G formula based on SCr of 1.39 mg/dL (H)). Liver Function Tests:  Recent Labs Lab 08/04/16 1245  AST 38  ALT 28  ALKPHOS 84  BILITOT 0.8  PROT 7.4  ALBUMIN 4.1   No results for input(s): LIPASE, AMYLASE in the last 168 hours. No results for input(s): AMMONIA in the last 168 hours. Coagulation Profile: No results for input(s): INR, PROTIME in the last 168 hours. Cardiac Enzymes: No results for input(s): CKTOTAL, CKMB, CKMBINDEX, TROPONINI in the last  168 hours. BNP (last 3 results) No results for input(s): PROBNP in the last 8760 hours. HbA1C: No results for input(s): HGBA1C in the last 72 hours. CBG: No results for input(s): GLUCAP in the last 168 hours. Lipid Profile: No results for input(s): CHOL, HDL, LDLCALC, TRIG, CHOLHDL, LDLDIRECT in the last 72 hours. Thyroid Function Tests: No results for input(s): TSH, T4TOTAL, FREET4, T3FREE, THYROIDAB in the last 72 hours. Anemia Panel: No results for input(s): VITAMINB12, FOLATE, FERRITIN, TIBC, IRON, RETICCTPCT in the last 72 hours. Urine analysis:    Component Value Date/Time   COLORURINE AMBER (A) 07/21/2016 2104   APPEARANCEUR CLOUDY (A) 07/21/2016 2104   LABSPEC 1.023 07/21/2016 2104   PHURINE 5.0 07/21/2016 2104   GLUCOSEU NEGATIVE 07/21/2016 2104   HGBUR NEGATIVE 07/21/2016 2104   BILIRUBINUR NEGATIVE 07/21/2016 2104   [place] 07/21/2016 2104   PROTEINUR 100 (A) 07/21/2016 2104   NITRITE NEGATIVE 07/21/2016  2104   LEUKOCYTESUR MODERATE (A) 07/21/2016 2104   Sepsis Labs: !!!!!!!!!!!!!!!!!!!!!!!!!!!!!!!!!!!!!!!!!!!! '@[id number]'$ (procalcitonin:4,lacticidven:4) )No results found for this or any previous visit (from the past 240 hour(s)).   Radiological Exams on Admission: Dg Chest 2 View  Result Date: 08/04/2016 CLINICAL DATA:  Shortness of breath and chest tightness. EXAM: CHEST  2 VIEW COMPARISON:  07/22/2016 and 07/21/2016 FINDINGS: The patient has progressive bilateral pulmonary infiltrates, most consistent with pulmonary edema. Cardiomegaly. Slight pulmonary vascular prominence. The lungs are somewhat hyperinflated with flattening of the diaphragm. Small bilateral pleural effusions. IMPRESSION: Progressive bilateral pulmonary infiltrates most likely pulmonary edema. New small bilateral effusions. COPD. Electronically Signed   By: Lorriane Shire M.D.   On: 08/04/2016 13:02    EKG: Independently reviewed. Atrial fibrillation rhythm,  prolonged QTc 558, with left axis deviation and LVH per EKG criteria.  Assessment/Plan Active Problems:   * No active hospital problems. *   This is a 61 year old female who presented to hospital with the chief complaint acute dyspnea, apparently the shortness of breath woke her up from her sleep. On initial evaluation she was noted to be hypoxic and volume overloaded. She admits being noncompliant with her medications or a fluid/ sodium restricted diet. Recent hospitalization about 9 days ago for hypotension apparently multifactorial. Blood pressure 140/101, heart rate 98, respiratory rate 20, oxygen saturation 100% on 4 L nasal cannula. Temperature 97.8. She does have moderate JVD, diffuse bilateral rales but no wheezing, heart S1-S2 present irregularly irregular, no lower extremity edema. Sodium 135, potassium 4.9, chloride 12, bicarbonate 26, BUN 23, creatinine 1.39, BNP 1367, white count 5.3, hemoglobin 11.2, hematocrit 34.2, platelets 329, chest x-ray AP and lateral  personally reviewed, bilateral pleural effusions, fluid in the right fissure, bilateral interstitial infiltrates with cephalization of the vasculature. The film is hyperinflated, rotated right side, with good penetration.  The patient will be admitted to the hospital with working diagnosis of acute cardiogenic pulmonary edema due to decompensated diastolic heart failure.   1. Diastolic heart failure, acute on chronic. Patient will be admitted to medical floor with a remote telemetry monitor, will start diuresis with furosemide 40 mg intravenously twice daily, strict ins and outs and daily weights. Last echocardiogram show normal systolic LV function. Will target a negative fluid balance. Continue beta blockade, ACE inhibitor once GFR stable.   2. Cardiogenic pulmonary edema. Continue diuresis, oximetry monitoring and supplemental oxygen per nasal cannula to target O2 saturation more than 92%.  3. Uncontrolled hypertension. Will continue metoprolol 25 g twice daily, continue diuresis with furosemide, will start patient on hydrochlorothiazide 25  g daily and will follow blood pressure response. ACE inhibitor will be a second agent if needed. Will add IV hydralazine as needed, for systolic blood pressure of 180.  4. Atrial fibrillation. Chronic. Will continue rate control with amiodarone, anticoagulation with aspirin.  5. Seizures. Continue lamotrigine  6. Dementia. Will continue donepezil, memantine and cervical. Physical therapy evaluation.   DVT prophylaxis: enoxaparin Code Status: full  Family Communication: No family at the bedside  Disposition Plan: Home  Consults called: None  Admission status: Inpatient    Sharonlee Nine Gerome Apley MD [facility] Pager [phone]  If 7PM-7AM, please contact night-coverage [email or web address] Password [email or web address]  08/04/2016, 2:56 PM

## 2016-08-04 NOTE — ED Triage Notes (Signed)
Per EMS pt from home recently diagnosed with COPD and afib. Called out for SOB onset this am. 5 albuterol given with EMS due to shallow rapid breathing.

## 2016-08-04 NOTE — ED Provider Notes (Signed)
[place] DEPT Provider Note   CSN: [id number] Arrival date & time: 08/04/16  1125     History   Chief Complaint Chief Complaint  Patient presents with  . Shortness of Breath    HPI Brittany [name] is a 61 y.o. female.  Patient w hx afib, chf, copd, c/o feeling sob and generally shaky earlier this AM.  Symptoms at rest, were moderate, persistent. Ems was called, gave neb tx, now feeling mildly improved. Denies current or recent chest pain or discomfort. Denies leg pain or swelling. ?orthopnea. No pnd.  Denies cough or uri c/o. States compliant w normal meds. Denies fever or chills. Denies palpitations. No syncope.    The history is provided by the patient.  Shortness of Breath  Pertinent negatives include no fever, no headaches, no sore throat, no neck pain, no cough, no chest pain, no vomiting, no abdominal pain, no rash and no leg swelling.    Past Medical History:  Diagnosis Date  . Atrial fibrillation ([place])   . CHF (congestive heart failure) ([place])   . COPD (chronic obstructive pulmonary disease) ([place])   . CVA (cerebral vascular accident) ([place])   . 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 HTN (hypertension)   . PAD (peripheral artery disease) ([place])   . Pancreatitis   . Seizures [facility])     Patient Active Problem List   Diagnosis Date Noted  . Malnutrition of moderate degree 07/26/2016  . Atrial flutter ([place])   . AKI (acute kidney injury) ([place])   . Cardiogenic shock ([place]) 07/21/2016  . Syncope and collapse   . Encounter for central line care   . Hypotension     Past Surgical History:  Procedure Laterality Date  . CENTRAL LINE INSERTION  07/21/2016        OB History    No data available       Home Medications    Prior to Admission medications   Medication Sig Start Date End Date Taking? Authorizing Provider  acetaminophen (TYLENOL) 500 MG tablet Take 500-1,000 mg by mouth 2 (two) times daily as needed (for pain).    Historical Provider, MD  amiodarone  (PACERONE) 200 MG tablet Take 1 tablet (200 mg total) by mouth daily. Start taking 07/30/15 07/26/16   Costin Karlyne Greenspan, MD  amiodarone (PACERONE) 400 MG tablet Take 1 tablet (400 mg total) by mouth every 8 (eight) hours. 07/26/16 07/29/16  Costin Karlyne Greenspan, MD  aspirin EC 325 MG tablet Take 325 mg by mouth daily.    Historical Provider, MD  Cholecalciferol (VITAMIN D3) 2000 units TABS Take 2,000 Units by mouth daily.    Historical Provider, MD  donepezil (ARICEPT) 10 MG tablet Take 10 mg by mouth at bedtime.    Historical Provider, MD  lamoTRIgine (LAMICTAL) 100 MG tablet Take 100 mg by mouth 2 (two) times daily.    Historical Provider, MD  latanoprost (XALATAN) 0.005 % ophthalmic solution Place 1 drop into both eyes at bedtime.    Historical Provider, MD  levETIRAcetam (KEPPRA) 750 MG tablet Take 750 mg by mouth 2 (two) times daily.    Historical Provider, MD  memantine (NAMENDA) 10 MG tablet Take 10 mg by mouth 2 (two) times daily.    Historical Provider, MD  metoprolol tartrate (LOPRESSOR) 25 MG tablet Take 0.5 tablets (12.5 mg total) by mouth 2 (two) times daily. 07/26/16   Costin Karlyne Greenspan, MD  Nutritional Supplements (FEEDING SUPPLEMENT, VITAL 1.5 CAL,) LIQD Take 237 mLs by mouth See  admin instructions. TWO TO THREE TIMES A DAY    Historical Provider, MD  Pancrelipase, Lip-Prot-Amyl, (CREON) 24000-76000 units CPEP Take 1 capsule by mouth 3 (three) times daily with meals. FOR DIGESTION    Historical Provider, MD  QUEtiapine (SEROQUEL) 25 MG tablet Take 12.5 mg by mouth every evening.    Historical Provider, MD    Family History Family History  Problem Relation Age of Onset  . Hypertension Father     Social History Social History  Substance Use Topics  . Smoking status: Former Research scientist (life sciences)  . Smokeless tobacco: Never Used  . Alcohol use Yes     Allergies   Patient has no known allergies.   Review of Systems Review of Systems  Constitutional: Negative for chills and fever.  HENT:  Negative for sore throat.   Eyes: Negative for redness.  Respiratory: Positive for shortness of breath. Negative for cough.   Cardiovascular: Negative for chest pain, palpitations and leg swelling.  Gastrointestinal: Negative for abdominal pain and vomiting.  Genitourinary: Negative for flank pain.  Musculoskeletal: Negative for back pain and neck pain.  Skin: Negative for rash.  Neurological: Negative for headaches.  Hematological: Does not bruise/bleed easily.  Psychiatric/Behavioral: Negative for confusion.     Physical Exam Updated Vital Signs BP (!) 129/107 (BP Location: Left Arm)   Pulse 99   Temp 97.8 F (36.6 C) (Oral)   Resp 22   Ht 5' 3.5" (1.613 m)   Wt 45.8 kg   SpO2 100%   BMI 17.61 kg/m   Physical Exam  Constitutional: She appears well-developed and well-nourished. No distress.  HENT:  Mouth/Throat: Oropharynx is clear and moist.  Eyes: Conjunctivae are normal. No scleral icterus.  Neck: Neck supple. No JVD present. No tracheal deviation present.  Cardiovascular: Normal rate, normal heart sounds and intact distal pulses.   Pulmonary/Chest: Effort normal. No respiratory distress. She has wheezes. She has rales.  Wheezes. Basilar rale.   Abdominal: Soft. Normal appearance and bowel sounds are normal. She exhibits no distension. There is no tenderness.  Musculoskeletal: She exhibits no edema or tenderness.  Neurological: She is alert.  Skin: Skin is warm and dry. No rash noted. She is not diaphoretic.  Psychiatric: She has a normal mood and affect.  Nursing note and vitals reviewed.    ED Treatments / Results  Labs (all labs ordered are listed, but only abnormal results are displayed) Results for orders placed or performed during the hospital encounter of 08/04/16  Brain natriuretic peptide  Result Value Ref Range   B Natriuretic Peptide 1,367.2 (H) 0.0 - 100.0 pg/mL  CBC  Result Value Ref Range   WBC 5.3 4.0 - 10.5 K/uL   RBC 4.09 3.87 - 5.11 MIL/uL     Hemoglobin 11.2 (L) 12.0 - 15.0 g/dL   HCT 34.2 (L) 36.0 - 46.0 %   MCV 83.6 78.0 - 100.0 fL   MCH 27.4 26.0 - 34.0 pg   MCHC 32.7 30.0 - 36.0 g/dL   RDW 15.2 11.5 - 15.5 %   Platelets 329 150 - 400 K/uL  Comprehensive metabolic panel  Result Value Ref Range   Sodium 135 135 - 145 mmol/L   Potassium 4.9 3.5 - 5.1 mmol/L   Chloride 102 101 - 111 mmol/L   CO2 26 22 - 32 mmol/L   Glucose, Bld 95 65 - 99 mg/dL   BUN 23 (H) 6 - 20 mg/dL   Creatinine, Ser 1.39 (H) 0.44 - 1.00 mg/dL  Calcium 9.0 8.9 - 10.3 mg/dL   Total Protein 7.4 6.5 - 8.1 g/dL   Albumin 4.1 3.5 - 5.0 g/dL   AST 38 15 - 41 U/L   ALT 28 14 - 54 U/L   Alkaline Phosphatase 84 38 - 126 U/L   Total Bilirubin 0.8 0.3 - 1.2 mg/dL   GFR calc non Af Amer 41 (L) >60 mL/min   GFR calc Af Amer 47 (L) >60 mL/min   Anion gap 7 5 - 15  I-stat troponin, ED  Result Value Ref Range   Troponin i, poc 0.01 0.00 - 0.08 ng/mL   Comment 3           Dg Chest 2 View  Result Date: 08/04/2016 CLINICAL DATA:  Shortness of breath and chest tightness. EXAM: CHEST  2 VIEW COMPARISON:  07/22/2016 and 07/21/2016 FINDINGS: The patient has progressive bilateral pulmonary infiltrates, most consistent with pulmonary edema. Cardiomegaly. Slight pulmonary vascular prominence. The lungs are somewhat hyperinflated with flattening of the diaphragm. Small bilateral pleural effusions. IMPRESSION: Progressive bilateral pulmonary infiltrates most likely pulmonary edema. New small bilateral effusions. COPD. Electronically Signed   By: Lorriane Shire M.D.   On: 08/04/2016 13:02   Ct Head Wo Contrast  Result Date: 07/21/2016 CLINICAL DATA:  Syncope, hypotensive and septic.  No headache. EXAM: CT HEAD WITHOUT CONTRAST TECHNIQUE: Contiguous axial images were obtained from the base of the skull through the vertex without intravenous contrast. COMPARISON:  None. FINDINGS: BRAIN: There is sulcal and ventricular prominence consistent with superficial and central  atrophy. No intraparenchymal hemorrhage, mass effect nor midline shift. Mild degree of periventricular and subcortical white matter hypodensities consistent with chronic small vessel ischemic disease are identified. No acute large vascular territory infarcts. No abnormal extra-axial fluid collections. Basal cisterns are not effaced and midline. Normal variant cavum septum pellucidum and vergae. VASCULAR: Moderate calcific atherosclerosis of the carotid siphons. SKULL: No skull fracture. No significant scalp soft tissue swelling. SINUSES/ORBITS: The mastoid air-cells are clear. The included paranasal sinuses are well-aerated.The included ocular globes and orbital contents are non-suspicious. OTHER: None. IMPRESSION: Likely chronic small vessel ischemic disease of periventricular white matter with cerebral atrophy. Electronically Signed   By: Ashley Royalty M.D.   On: 07/21/2016 17:20   Dg Chest Port 1 View  Result Date: 07/22/2016 CLINICAL DATA:  Pt states she "originally came to the hospital because she passed out and thought she was having a stroke." No current chest complaints. RN states Dr Lu Duffel be wanting to evaluate fluid levels. EXAM: PORTABLE CHEST 1 VIEW COMPARISON:  07/21/2016. FINDINGS: Mild hazy airspace opacity developed in the right mid to lower lung. Mild interstitial thickening in the lower lungs is stable. Lungs are hyperexpanded. Cardiac silhouette is mildly enlarged. Left internal jugular central venous line is stable. No pneumothorax.  No definite pleural effusion. IMPRESSION: 1. Mild hazy airspace opacity has developed in the right mid to lower lung. This may be from layering pleural fluid. Mild airspace edema is possible. 2. No other change. Mild persistent lower lung interstitial thickening. Overall, findings again suggests mild congestive heart failure. Electronically Signed   By: Lajean Manes M.D.   On: 07/22/2016 07:59   Dg Chest Port 1 View  Result Date: 07/21/2016 CLINICAL DATA:  Central  line placement EXAM: PORTABLE CHEST 1 VIEW COMPARISON:  07/21/2016 FINDINGS: There is a left jugular central venous catheter with the tip projecting over the SVC. There is bilateral diffuse interstitial thickening. There is a trace right pleural effusion.  There is no left pleural effusion. There is no pneumothorax. There is mild stable cardiomegaly. The osseous structures are unremarkable. IMPRESSION: Mild CHF. Left jugular central venous catheter with the tip projecting over the SVC. Electronically Signed   By: Kathreen Devoid   On: 07/21/2016 15:12   Dg Chest Port 1 View  Result Date: 07/21/2016 CLINICAL DATA:  Syncope.  Chest pain.  Shortness of breath. EXAM: PORTABLE CHEST 1 VIEW COMPARISON:  None. FINDINGS: There is borderline cardiomegaly. Pulmonary vascularity is normal and the lungs are clear. No acute bone abnormality. Old nonunion fracture of the distal right clavicle. IMPRESSION: Borderline cardiomegaly.  No other significant abnormalities. Electronically Signed   By: Lorriane Shire M.D.   On: 07/21/2016 11:23    EKG  EKG Interpretation  Date/Time:  Friday August 04 2016 12:21:04 EST Ventricular Rate:  95 PR Interval:    QRS Duration: 95 QT Interval:  448 QTC Calculation: 558 R Axis:   28 Text Interpretation:  Atrial fibrillation Non-specific ST-t changes Prolonged QT interval Confirmed by Ashok Cordia  MD, Lennette Bihari ([id number]) on 08/04/2016 12:25:42 PM       Radiology Dg Chest 2 View  Result Date: 08/04/2016 CLINICAL DATA:  Shortness of breath and chest tightness. EXAM: CHEST  2 VIEW COMPARISON:  07/22/2016 and 07/21/2016 FINDINGS: The patient has progressive bilateral pulmonary infiltrates, most consistent with pulmonary edema. Cardiomegaly. Slight pulmonary vascular prominence. The lungs are somewhat hyperinflated with flattening of the diaphragm. Small bilateral pleural effusions. IMPRESSION: Progressive bilateral pulmonary infiltrates most likely pulmonary edema. New small bilateral  effusions. COPD. Electronically Signed   By: Lorriane Shire M.D.   On: 08/04/2016 13:02    Procedures Procedures (including critical care time)  Medications Ordered in ED Medications - No data to display   Initial Impression / Assessment and Plan / ED Course  I have reviewed the triage vital signs and the nursing notes.  Pertinent labs & imaging results that were available during my care of the patient were reviewed by me and considered in my medical decision making (see chart for details).  Reviewed nursing notes and prior charts for additional history.   Cxr. Labs.   Radiology indicates increased bil infiltrates ?edema, bnp high. Lasix iv.   Albuterol and atrovent neb re wheezing.  Given persistent dyspnea, increasing infiltrate, will admit.  Pt afeb, denies fevers/chills, no increased cough, wbc normal, therefore do not suspect pna at current time.   Hospitalist consulted for admission.     Final Clinical Impressions(s) / ED Diagnoses   Final diagnoses:  None    New Prescriptions New Prescriptions   No medications on file     Lajean Saver, MD 08/04/16 1349

## 2016-08-04 NOTE — ED Notes (Signed)
Pt stopped breathing treatment after 1 minute. Reports she does not like it and will not continue with it.

## 2016-08-05 DIAGNOSIS — N183 Chronic kidney disease, stage 3 unspecified: Secondary | ICD-10-CM | POA: Diagnosis present

## 2016-08-05 DIAGNOSIS — R569 Unspecified convulsions: Secondary | ICD-10-CM

## 2016-08-05 DIAGNOSIS — F0390 Unspecified dementia without behavioral disturbance: Secondary | ICD-10-CM | POA: Diagnosis present

## 2016-08-05 LAB — CBC
HCT: 38.4 % (ref 36.0–46.0)
HEMOGLOBIN: 12.3 g/dL (ref 12.0–15.0)
MCH: 26.6 pg (ref 26.0–34.0)
MCHC: 32 g/dL (ref 30.0–36.0)
MCV: 83.1 fL (ref 78.0–100.0)
PLATELETS: 284 10*3/uL (ref 150–400)
RBC: 4.62 MIL/uL (ref 3.87–5.11)
RDW: 15.1 % (ref 11.5–15.5)
WBC: 5.9 10*3/uL (ref 4.0–10.5)

## 2016-08-05 LAB — COMPREHENSIVE METABOLIC PANEL
ALK PHOS: 91 U/L (ref 38–126)
ALT: 27 U/L (ref 14–54)
ANION GAP: 17 — AB (ref 5–15)
AST: 40 U/L (ref 15–41)
Albumin: 4.3 g/dL (ref 3.5–5.0)
BILIRUBIN TOTAL: 0.6 mg/dL (ref 0.3–1.2)
BUN: 24 mg/dL — ABNORMAL HIGH (ref 6–20)
CALCIUM: 9.6 mg/dL (ref 8.9–10.3)
CO2: 22 mmol/L (ref 22–32)
Chloride: 99 mmol/L — ABNORMAL LOW (ref 101–111)
Creatinine, Ser: 1.49 mg/dL — ABNORMAL HIGH (ref 0.44–1.00)
GFR, EST AFRICAN AMERICAN: 44 mL/min — AB (ref >60)
GFR, EST NON AFRICAN AMERICAN: 38 mL/min — AB (ref >60)
Glucose, Bld: 95 mg/dL (ref 65–99)
Potassium: 4 mmol/L (ref 3.5–5.1)
SODIUM: 138 mmol/L (ref 135–145)
TOTAL PROTEIN: 7.5 g/dL (ref 6.5–8.1)

## 2016-08-05 NOTE — Progress Notes (Signed)
PROGRESS NOTE    Brittany [name]  TFT:[id number] DOB: Mar 22, 1956 DOA: 08/04/2016 PCP: Pcp Not In System     Brief Narrative:  Brittany [name] is a 61 y.o. female with medical history significant of diastolic heart failure, who presents with the chief complain of worsening dyspnea. Recent hospitalization with discharge diagnosis of shock and hypotension associated with diarrhea and uncontrolled atrial fibrillation.  She has been at her usual state of health until this am when abruptly felt dyspnea.  Assessment & Plan:   Principal Problem:   Acute on chronic diastolic heart failure (HCC) Active Problems:   Atrial flutter (HCC)   Dementia   CKD (chronic kidney disease) stage 3, GFR 30-59 ml/min   Seizures (HCC)  Acute on chronic diastolic heart failure -BNP 1367 on admission  -CXR: Progressive bilateral pulmonary infiltrates most likely pulmonary edema -Lasix '40mg'$  IV BID, HCTZ  -Daily weights, I/Os  -Consulted cardiology  Persistent A Flutter  -CHADSVASc 6, but high risk for falls and bleed. At last admission, cardiology decided against anticoagulation.  -Continue amiodarone, aspirin, lopressor   CKD stage 3 -Baseline Cr 1.1-1.3  -Stable   Dementia -Continue aricept, namenda, seroquel   Seizure disorder -Continue lamictal, keppra    DVT prophylaxis: lovenox Code Status: full Family Communication: no family at bedside Disposition Plan: pending further improvement    Consultants:   Cardiology  Procedures:   None  Antimicrobials:   None     Subjective: Patient is quite a poor historian. She states that she was very short of breath which brought her to the hospital. She currently states that she is feeling well. Has no complaints. She denies any chest pain, cough, nausea or vomiting.  Objective: Vitals:   08/04/16 2035 08/05/16 0549 08/05/16 0738 08/05/16 1018  BP: 118/80 102/78  107/62  Pulse: 90 90  88  Resp: 18 18    Temp: 97.6 F (36.4 C) 98.3 F  (36.8 C)  97.3 F (36.3 C)  TempSrc: Oral Oral  Oral  SpO2: 99% 99% 100% 100%  Weight:      Height:        Intake/Output Summary (Last 24 hours) at 08/05/16 1223 Last data filed at 08/05/16 0937  Gross per 24 hour  Intake                0 ml  Output             2300 ml  Net            -2300 ml   Filed Weights   08/04/16 1143 08/04/16 1556  Weight: 45.8 kg (101 lb) 44.8 kg (98 lb 12.3 oz)    Examination:  General exam: Appears calm and comfortable  Respiratory system: Clear to auscultation. Respiratory effort normal. Cardiovascular system: S1 & S2 heard, Irregular rhythm. No JVD, murmurs, rubs, gallops or clicks. No pedal edema. Gastrointestinal system: Abdomen is nondistended, soft and nontender. No organomegaly or masses felt. Normal bowel sounds heard. Central nervous system: Alert and oriented. No focal neurological deficits.  Extremities: Symmetric 5 x 5 power. Skin: No rashes, lesions or ulcers Psychiatry: Poor historian, hx dementia   Data Reviewed: I have personally reviewed following labs and imaging studies  CBC:  Recent Labs Lab 08/04/16 1245 08/04/16 1700 08/05/16 0556  WBC 5.3 6.0 5.9  HGB 11.2* 11.9* 12.3  HCT 34.2* 37.4 38.4  MCV 83.6 82.9 83.1  PLT 329 352 706   Basic Metabolic Panel:  Recent Labs Lab 08/04/16 1245 08/04/16  1700 08/05/16 0556  NA 135  --  138  K 4.9  --  4.0  CL 102  --  99*  CO2 26  --  22  GLUCOSE 95  --  95  BUN 23*  --  24*  CREATININE 1.39* 1.30* 1.49*  CALCIUM 9.0  --  9.6   GFR: Estimated Creatinine Clearance: 29.1 mL/min (by C-G formula based on SCr of 1.49 mg/dL (H)). Liver Function Tests:  Recent Labs Lab 08/04/16 1245 08/05/16 0556  AST 38 40  ALT 28 27  ALKPHOS 84 91  BILITOT 0.8 0.6  PROT 7.4 7.5  ALBUMIN 4.1 4.3   No results for input(s): LIPASE, AMYLASE in the last 168 hours. No results for input(s): AMMONIA in the last 168 hours. Coagulation Profile: No results for input(s): INR, PROTIME in  the last 168 hours. Cardiac Enzymes: No results for input(s): CKTOTAL, CKMB, CKMBINDEX, TROPONINI in the last 168 hours. BNP (last 3 results) No results for input(s): PROBNP in the last 8760 hours. HbA1C: No results for input(s): HGBA1C in the last 72 hours. CBG: No results for input(s): GLUCAP in the last 168 hours. Lipid Profile: No results for input(s): CHOL, HDL, LDLCALC, TRIG, CHOLHDL, LDLDIRECT in the last 72 hours. Thyroid Function Tests: No results for input(s): TSH, T4TOTAL, FREET4, T3FREE, THYROIDAB in the last 72 hours. Anemia Panel: No results for input(s): VITAMINB12, FOLATE, FERRITIN, TIBC, IRON, RETICCTPCT in the last 72 hours. Sepsis Labs: No results for input(s): PROCALCITON, LATICACIDVEN in the last 168 hours.  No results found for this or any previous visit (from the past 240 hour(s)).     Radiology Studies: Dg Chest 2 View  Result Date: 08/04/2016 CLINICAL DATA:  Shortness of breath and chest tightness. EXAM: CHEST  2 VIEW COMPARISON:  07/22/2016 and 07/21/2016 FINDINGS: The patient has progressive bilateral pulmonary infiltrates, most consistent with pulmonary edema. Cardiomegaly. Slight pulmonary vascular prominence. The lungs are somewhat hyperinflated with flattening of the diaphragm. Small bilateral pleural effusions. IMPRESSION: Progressive bilateral pulmonary infiltrates most likely pulmonary edema. New small bilateral effusions. COPD. Electronically Signed   By: Lorriane Shire M.D.   On: 08/04/2016 13:02      Scheduled Meds: . amiodarone  200 mg Oral Daily  . aspirin EC  325 mg Oral Daily  . cholecalciferol  2,000 Units Oral Daily  . donepezil  10 mg Oral QHS  . enoxaparin (LOVENOX) injection  30 mg Subcutaneous Q24H  . feeding supplement  1 Container Oral Daily  . feeding supplement (OSMOLITE 1.5 CAL)  237 mL Oral BID  . furosemide  40 mg Intravenous Q12H  . hydrochlorothiazide  25 mg Oral Daily  . lamoTRIgine  100 mg Oral BID  . latanoprost  1  drop Both Eyes QHS  . levETIRAcetam  750 mg Oral BID  . lipase/protease/amylase  12,000 Units Oral TID WC  . memantine  10 mg Oral BID  . metoprolol tartrate  25 mg Oral BID  . QUEtiapine  12.5 mg Oral QPM  . sodium chloride flush  3 mL Intravenous Q12H  . sodium chloride flush  3 mL Intravenous Q12H   Continuous Infusions:   LOS: 1 day    Time spent: 40 minutes   Dessa Phi, DO [facility] [email or web address] Password [facility] 08/05/2016, 12:23 PM

## 2016-08-05 NOTE — Evaluation (Signed)
Physical Therapy Evaluation Patient Details Name: Brittany [name] MRN: [id number] DOB: 12/27/1955 Today's Date: 08/05/2016   History of Present Illness  61 yo female admitted with acute CHF. Hx of Sz, CVA, COPD, Afib, CHF, PAD, dementia  2* ETOH  Clinical Impression  On eval, pt was Min guard assist for mobility. She walked ~115 feet with a RW in the hallway, ~15 feet in room without a device. Pt tolerated activity well. Recommend daily ambulation in hallway with nursing supervision. Do not anticipate any follow up PT needs at discharge.     Follow Up Recommendations Supervision for mobility/OOB    Equipment Recommendations  None recommended by PT    Recommendations for Other Services       Precautions / Restrictions Precautions Precautions: Fall Restrictions Weight Bearing Restrictions: No      Mobility  Bed Mobility               General bed mobility comments: sitting EOB with nurse in room  Transfers Overall transfer level: Needs assistance   Transfers: Sit to/from Stand Sit to Stand: Supervision         General transfer comment: for safety  Ambulation/Gait Ambulation/Gait assistance: Min guard Ambulation Distance (Feet): 115 Feet Assistive device: Rolling walker (2 wheeled) Gait Pattern/deviations: Step-through pattern     General Gait Details: close guard for safety. fair gait speed.   Stairs            Wheelchair Mobility    Modified Rankin (Stroke Patients Only)       Balance                                             Pertinent Vitals/Pain Pain Assessment: No/denies pain    Home Living Family/patient expects to be discharged to:: Private residence Living Arrangements: Children Available Help at Discharge: Family;Available 24 hours/day Type of Home: House Home Access: Stairs to enter Entrance Stairs-Rails: Can reach both Entrance Stairs-Number of Steps: 2   Home Equipment: Walker - 2 wheels      Prior  Function Level of Independence: Independent with assistive device(s)               Hand Dominance        Extremity/Trunk Assessment   Upper Extremity Assessment Upper Extremity Assessment: Overall WFL for tasks assessed    Lower Extremity Assessment Lower Extremity Assessment: Generalized weakness    Cervical / Trunk Assessment Cervical / Trunk Assessment: Normal  Communication   Communication: No difficulties  Cognition Arousal/Alertness: Awake/alert Behavior During Therapy: WFL for tasks assessed/performed Overall Cognitive Status: No family/caregiver present to determine baseline cognitive functioning                      General Comments      Exercises     Assessment/Plan    PT Assessment Patient needs continued PT services  PT Problem List Decreased mobility;Decreased knowledge of use of DME          PT Treatment Interventions DME instruction;Therapeutic activities;Therapeutic exercise;Patient/family education;Functional mobility training    PT Goals (Current goals can be found in the Care Plan section)  Acute Rehab PT Goals Patient Stated Goal: to go home PT Goal Formulation: With patient Time For Goal Achievement: 08/19/16 Potential to Achieve Goals: Good    Frequency Min 3X/week   Barriers to discharge  Co-evaluation               End of Session Equipment Utilized During Treatment: Gait belt Activity Tolerance: Patient tolerated treatment well Patient left: in chair;with call bell/phone within reach;with chair alarm set           Time: 1435-1446 PT Time Calculation (min) (ACUTE ONLY): 11 min   Charges:   PT Evaluation $PT Eval Low Complexity: 1 Procedure     PT G Codes:        Weston Anna, MPT Pager: [phone]

## 2016-08-06 ENCOUNTER — Encounter (HOSPITAL_COMMUNITY): Payer: Self-pay | Admitting: Cardiology

## 2016-08-06 DIAGNOSIS — I4892 Unspecified atrial flutter: Secondary | ICD-10-CM

## 2016-08-06 MED ORDER — FUROSEMIDE 10 MG/ML IJ SOLN
40.0000 mg | Freq: Every day | INTRAMUSCULAR | Status: DC
  Administered 2016-08-06: 40 mg via INTRAVENOUS
  Filled 2016-08-06: qty 4

## 2016-08-06 NOTE — Progress Notes (Signed)
PROGRESS NOTE    Lielle Vandervort  TDD:[id number] DOB: March 24, 1956 DOA: 08/04/2016 PCP: Pcp Not In System     Brief Narrative:  Brittany [name] is a 61 y.o. female with medical history significant of diastolic heart failure, who presents with the chief complain of worsening dyspnea. Recent hospitalization with discharge diagnosis of shock and hypotension associated with diarrhea and uncontrolled atrial fibrillation.  She has been at her usual state of health until this am when abruptly felt dyspnea. She admits that she has not been compliant with low salt or fluid restriction.   Assessment & Plan:   Principal Problem:   Acute on chronic diastolic heart failure (HCC) Active Problems:   Atrial flutter (HCC)   Dementia   CKD (chronic kidney disease) stage 3, GFR 30-59 ml/min   Seizures (HCC)  Acute on chronic diastolic heart failure -BNP 1367 on admission  -CXR: Progressive bilateral pulmonary infiltrates most likely pulmonary edema -Lasix '40mg'$  IV QD, HCTZ  -Daily weights, I/Os  -Appreciate cardiology   Persistent A Flutter  -CHADSVASc 6, but high risk for falls and bleed. At last admission, cardiology decided against anticoagulation.  -Continue amiodarone, aspirin, lopressor   CKD stage 3 -Baseline Cr 1.1-1.3  -Stable   Dementia -Continue aricept, namenda, seroquel   Seizure disorder -Continue lamictal, keppra    DVT prophylaxis: lovenox Code Status: full Family Communication: no family at bedside Disposition Plan: pending further improvement    Consultants:   Cardiology  Procedures:   None  Antimicrobials:   None     Subjective: Patient states that her breathing has improved with Lasix. No further complaints today. Denies any chest pain or shortness of breath, no cough. Tolerating meals.  Objective: Vitals:   08/05/16 2134 08/06/16 0437 08/06/16 0439 08/06/16 0917  BP: (!) 73/60 99/82  100/82  Pulse:  61  72  Resp:  20    Temp:  99 F (37.2 C)      TempSrc:  Oral    SpO2:  98%    Weight:   41.4 kg (91 lb 4.3 oz)   Height:        Intake/Output Summary (Last 24 hours) at 08/06/16 1239 Last data filed at 08/06/16 0441  Gross per 24 hour  Intake                0 ml  Output              300 ml  Net             -300 ml   Filed Weights   08/04/16 1143 08/04/16 1556 08/06/16 0439  Weight: 45.8 kg (101 lb) 44.8 kg (98 lb 12.3 oz) 41.4 kg (91 lb 4.3 oz)    Examination:  General exam: Appears calm and comfortable  Respiratory system: Clear to auscultation. Respiratory effort normal. Cardiovascular system: S1 & S2 heard, Irregular rhythm. No JVD, murmurs, rubs, gallops or clicks. No pedal edema. Gastrointestinal system: Abdomen is nondistended, soft and nontender. No organomegaly or masses felt. Normal bowel sounds heard. Central nervous system: Alert and oriented. No focal neurological deficits.  Extremities: Symmetric 5 x 5 power. Skin: No rashes, lesions or ulcers Psychiatry: Poor historian, hx dementia   Data Reviewed: I have personally reviewed following labs and imaging studies  CBC:  Recent Labs Lab 08/04/16 1245 08/04/16 1700 08/05/16 0556  WBC 5.3 6.0 5.9  HGB 11.2* 11.9* 12.3  HCT 34.2* 37.4 38.4  MCV 83.6 82.9 83.1  PLT 329 352 284  Basic Metabolic Panel:  Recent Labs Lab 08/04/16 1245 08/04/16 1700 08/05/16 0556  NA 135  --  138  K 4.9  --  4.0  CL 102  --  99*  CO2 26  --  22  GLUCOSE 95  --  95  BUN 23*  --  24*  CREATININE 1.39* 1.30* 1.49*  CALCIUM 9.0  --  9.6   GFR: Estimated Creatinine Clearance: 26.9 mL/min (by C-G formula based on SCr of 1.49 mg/dL (H)). Liver Function Tests:  Recent Labs Lab 08/04/16 1245 08/05/16 0556  AST 38 40  ALT 28 27  ALKPHOS 84 91  BILITOT 0.8 0.6  PROT 7.4 7.5  ALBUMIN 4.1 4.3   No results for input(s): LIPASE, AMYLASE in the last 168 hours. No results for input(s): AMMONIA in the last 168 hours. Coagulation Profile: No results for input(s): INR,  PROTIME in the last 168 hours. Cardiac Enzymes: No results for input(s): CKTOTAL, CKMB, CKMBINDEX, TROPONINI in the last 168 hours. BNP (last 3 results) No results for input(s): PROBNP in the last 8760 hours. HbA1C: No results for input(s): HGBA1C in the last 72 hours. CBG: No results for input(s): GLUCAP in the last 168 hours. Lipid Profile: No results for input(s): CHOL, HDL, LDLCALC, TRIG, CHOLHDL, LDLDIRECT in the last 72 hours. Thyroid Function Tests: No results for input(s): TSH, T4TOTAL, FREET4, T3FREE, THYROIDAB in the last 72 hours. Anemia Panel: No results for input(s): VITAMINB12, FOLATE, FERRITIN, TIBC, IRON, RETICCTPCT in the last 72 hours. Sepsis Labs: No results for input(s): PROCALCITON, LATICACIDVEN in the last 168 hours.  No results found for this or any previous visit (from the past 240 hour(s)).     Radiology Studies: Dg Chest 2 View  Result Date: 08/04/2016 CLINICAL DATA:  Shortness of breath and chest tightness. EXAM: CHEST  2 VIEW COMPARISON:  07/22/2016 and 07/21/2016 FINDINGS: The patient has progressive bilateral pulmonary infiltrates, most consistent with pulmonary edema. Cardiomegaly. Slight pulmonary vascular prominence. The lungs are somewhat hyperinflated with flattening of the diaphragm. Small bilateral pleural effusions. IMPRESSION: Progressive bilateral pulmonary infiltrates most likely pulmonary edema. New small bilateral effusions. COPD. Electronically Signed   By: Lorriane Shire M.D.   On: 08/04/2016 13:02      Scheduled Meds: . amiodarone  200 mg Oral Daily  . aspirin EC  325 mg Oral Daily  . cholecalciferol  2,000 Units Oral Daily  . donepezil  10 mg Oral QHS  . enoxaparin (LOVENOX) injection  30 mg Subcutaneous Q24H  . feeding supplement  1 Container Oral Daily  . feeding supplement (OSMOLITE 1.5 CAL)  237 mL Oral BID  . furosemide  40 mg Intravenous Daily  . hydrochlorothiazide  25 mg Oral Daily  . lamoTRIgine  100 mg Oral BID  .  latanoprost  1 drop Both Eyes QHS  . levETIRAcetam  750 mg Oral BID  . lipase/protease/amylase  12,000 Units Oral TID WC  . memantine  10 mg Oral BID  . metoprolol tartrate  25 mg Oral BID  . QUEtiapine  12.5 mg Oral QPM  . sodium chloride flush  3 mL Intravenous Q12H  . sodium chloride flush  3 mL Intravenous Q12H   Continuous Infusions:   LOS: 2 days    Time spent: 30 minutes   Dessa Phi, DO [facility] [email or web address] Password [facility] 08/06/2016, 12:39 PM

## 2016-08-06 NOTE — Plan of Care (Signed)
Problem: Safety: Goal: Ability to remain free from injury will improve Outcome: Completed/Met Date Met: 08/06/16 Bed alarm set. Discussed safety plan with pt, will continue to reorient and discuss safety prevention plan

## 2016-08-06 NOTE — Consult Note (Signed)
Requesting provider: Dr. Shon Millet Primary cardiologist: Dr. Daneen Schick Consulting cardiologist: Dr. Satira Sark  Reason for consultation: Diastolic heart failure  Clinical Summary Brittany [name] is a 61 y.o.female with past medical history outlined below, recently discharged from the hospital after treatment for atrial flutter with RVR, chronic diastolic heart failure, and relative hypotension. She has been managed medically with heart rate control strategy and no anticoagulation in light of recurrent falls. I reviewed the recent consultation note by Dr. Tamala Julian and subsequent follow-up by Dr. Oval Linsey.  She presented to the ER reporting recent increased shortness of breath, no obvious precipitant. Her history is limited by baseline dementia, details are not clear. States that she has been taking her medications. She was hypertensive at presentation with heart rate around 100 in atrial flutter. Chest x-ray showed progressive bilateral pulmonary infiltrates and small bilateral effusions. She has been afebrile. She is been treated with IV Lasix and diuresed approximately 2.5 L so far. States that she is feeling better.  No Known Allergies  Medications Scheduled Medications: . amiodarone  200 mg Oral Daily  . aspirin EC  325 mg Oral Daily  . cholecalciferol  2,000 Units Oral Daily  . donepezil  10 mg Oral QHS  . enoxaparin (LOVENOX) injection  30 mg Subcutaneous Q24H  . feeding supplement  1 Container Oral Daily  . feeding supplement (OSMOLITE 1.5 CAL)  237 mL Oral BID  . furosemide  40 mg Intravenous Q12H  . hydrochlorothiazide  25 mg Oral Daily  . lamoTRIgine  100 mg Oral BID  . latanoprost  1 drop Both Eyes QHS  . levETIRAcetam  750 mg Oral BID  . lipase/protease/amylase  12,000 Units Oral TID WC  . memantine  10 mg Oral BID  . metoprolol tartrate  25 mg Oral BID  . QUEtiapine  12.5 mg Oral QPM  . sodium chloride flush  3 mL Intravenous Q12H  . sodium  chloride flush  3 mL Intravenous Q12H    PRN Medications: sodium chloride, acetaminophen **OR** acetaminophen, hydrALAZINE, ondansetron **OR** ondansetron (ZOFRAN) IV, sodium chloride flush   Past Medical History:  Diagnosis Date  . Atrial flutter ([place])   . COPD (chronic obstructive pulmonary disease) ([place])   . CVA (cerebral vascular accident) ([place])   . 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 Diastolic heart failure ([place])   . HTN (hypertension)   . PAD (peripheral artery disease) ([place])   . Pancreatitis   . Seizures ([place])     Past Surgical History:  Procedure Laterality Date  . CENTRAL LINE INSERTION  07/21/2016        Family History  Problem Relation Age of Onset  . Hypertension Father     Social History [name] reports that she has quit smoking. She has never used smokeless tobacco. [name] reports that she drinks alcohol.  Review of Systems Complete review of systems negative except as otherwise outlined in the clinical summary and also the following. No leg edema. No syncope.  Physical Examination Blood pressure 99/82, pulse 61, temperature 99 F (37.2 C), temperature source Oral, resp. rate 20, height '5\' 3"'$  (1.6 m), weight 91 lb 4.3 oz (41.4 kg), SpO2 98 %.  Intake/Output Summary (Last 24 hours) at 08/06/16 0904 Last data filed at 08/06/16 0441  Gross per 24 hour  Intake                0 ml  Output  400 ml  Net             -400 ml   Telemetry: Atrial flutter with variable conduction.  Gen.: Chronically ill-appearing woman in no distress. HEENT: Conjunctiva and lids normal, oropharynx clear. Neck: Supple, no elevated JVP or carotid bruits, no thyromegaly. Lungs: Scattered rhonchi, nonlabored breathing at rest. Cardiac: Irregularly irregular, no S3 or significant systolic murmur, no pericardial rub. Abdomen: Soft, nontender, bowel sounds present. Extremities: No pitting edema, distal pulses 2+. Skin: Warm and dry. Musculoskeletal: No  kyphosis. Some muscle wasting noted. Neuropsychiatric: Alert and oriented x2, affect grossly appropriate.  Lab Results  Basic Metabolic Panel:  Recent Labs Lab 08/04/16 1245 08/04/16 1700 08/05/16 0556  NA 135  --  138  K 4.9  --  4.0  CL 102  --  99*  CO2 26  --  22  GLUCOSE 95  --  95  BUN 23*  --  24*  CREATININE 1.39* 1.30* 1.49*  CALCIUM 9.0  --  9.6    Liver Function Tests:  Recent Labs Lab 08/04/16 1245 08/05/16 0556  AST 38 40  ALT 28 27  ALKPHOS 84 91  BILITOT 0.8 0.6  PROT 7.4 7.5  ALBUMIN 4.1 4.3    CBC:  Recent Labs Lab 08/04/16 1245 08/04/16 1700 08/05/16 0556  WBC 5.3 6.0 5.9  HGB 11.2* 11.9* 12.3  HCT 34.2* 37.4 38.4  MCV 83.6 82.9 83.1  PLT 329 352 284    Cardiac Enzymes: Point-of-care troponin I 0.01  BNP: 1367  ECG I personally reviewed the tracing from 08/04/2016 which shows atrial flutter with variable conduction and nonspecific ST-T changes.  Imaging Chest x-ray 08/04/2016: FINDINGS: The patient has progressive bilateral pulmonary infiltrates, most consistent with pulmonary edema. Cardiomegaly. Slight pulmonary vascular prominence. The lungs are somewhat hyperinflated with flattening of the diaphragm. Small bilateral pleural effusions.  IMPRESSION: Progressive bilateral pulmonary infiltrates most likely pulmonary edema. New small bilateral effusions. COPD.  Impression  1. Patient presents with reported increased shortness of breath, now feeling better. Possibility of acute on chronic diastolic heart failure with chest x-ray showing increased pulmonary infiltrates compared to prior study. Denies any cough or fevers to suggest definite infection, no leukocytosis noted. She has been treated with IV Lasix and reports improvement. Recipient and unclear.  2. Persistent atrial flutter. Based on recent cardiology evaluation plan is to treat with heart rate control strategy in no anticoagulation. She is currently on amiodarone 200  mg daily, aspirin, and Lopressor.  3. History of relative hypotension. This does limit medication doses somewhat.  4. Dementia with history of alcohol abuse. She is currently on Aricept.  Recommendations  Would reduce Lasix to once daily dosing, she was not on any standing diuretic as an outpatient. Continue remaining cardiac regimen for heart rate control of atrial flutter. Consider switch to lower dose oral diuretic tomorrow if continues to improve clinically.  Satira Sark, M.D., F.A.C.C.

## 2016-08-07 DIAGNOSIS — N170 Acute kidney failure with tubular necrosis: Secondary | ICD-10-CM

## 2016-08-07 DIAGNOSIS — N183 Chronic kidney disease, stage 3 unspecified: Secondary | ICD-10-CM

## 2016-08-07 LAB — BASIC METABOLIC PANEL
Anion gap: 12 (ref 5–15)
BUN: 41 mg/dL — AB (ref 6–20)
CO2: 29 mmol/L (ref 22–32)
Calcium: 9.3 mg/dL (ref 8.9–10.3)
Chloride: 97 mmol/L — ABNORMAL LOW (ref 101–111)
Creatinine, Ser: 1.87 mg/dL — ABNORMAL HIGH (ref 0.44–1.00)
GFR calc Af Amer: 33 mL/min — ABNORMAL LOW (ref >60)
GFR, EST NON AFRICAN AMERICAN: 29 mL/min — AB (ref >60)
Glucose, Bld: 99 mg/dL (ref 65–99)
POTASSIUM: 3.4 mmol/L — AB (ref 3.5–5.1)
SODIUM: 138 mmol/L (ref 135–145)

## 2016-08-07 MED ORDER — VITAL 1.5 CAL PO LIQD
237.0000 mL | ORAL | Status: DC
  Filled 2016-08-07: qty 237

## 2016-08-07 MED ORDER — POTASSIUM CHLORIDE CRYS ER 20 MEQ PO TBCR
40.0000 meq | EXTENDED_RELEASE_TABLET | Freq: Once | ORAL | Status: AC
  Administered 2016-08-07: 40 meq via ORAL
  Filled 2016-08-07: qty 2

## 2016-08-07 MED ORDER — OSMOLITE 1.5 CAL PO LIQD
237.0000 mL | ORAL | Status: DC
  Administered 2016-08-07 – 2016-08-08 (×2): 237 mL via ORAL
  Filled 2016-08-07 (×3): qty 237

## 2016-08-07 NOTE — Progress Notes (Signed)
Physical Therapy Treatment Patient Details Name: Brittany [name] MRN: [id number] DOB: 1955-09-05 Today's Date: 08/23/2016    History of Present Illness 61 yo female admitted with acute CHF. Hx of Sz, CVA, COPD, Afib, CHF, PAD, dementia  2* ETOH    PT Comments    Pt progressing, prefers to walk without walker; she is asking about a cane for home--apparently she had one but gave it away (?)  Follow Up Recommendations  Supervision for mobility/OOB     Equipment Recommendations  None recommended by PT    Recommendations for Other Services       Precautions / Restrictions Precautions Precautions: Fall Restrictions Weight Bearing Restrictions: No    Mobility  Bed Mobility Overal bed mobility: Needs Assistance Bed Mobility: Supine to Sit;Sit to Supine     Supine to sit: Supervision Sit to supine: Supervision   General bed mobility comments: for safety  Transfers Overall transfer level: Needs assistance Equipment used: None Transfers: Sit to/from Stand Sit to Stand: Supervision         General transfer comment: for safety  Ambulation/Gait Ambulation/Gait assistance: Min guard Ambulation Distance (Feet): 200 Feet Assistive device: 1 person hand held assist Gait Pattern/deviations: Step-through pattern;Decreased stride length Gait velocity: decreased   General Gait Details: min/guard for safety, vaulting on RLE d/t not wearing orthopedic shoe    Stairs            Wheelchair Mobility    Modified Rankin (Stroke Patients Only)       Balance                                    Cognition Arousal/Alertness: Awake/alert Behavior During Therapy: WFL for tasks assessed/performed Overall Cognitive Status: No family/caregiver present to determine baseline cognitive functioning                      Exercises      General Comments        Pertinent Vitals/Pain Pain Assessment: No/denies pain    Home Living                       Prior Function            PT Goals (current goals can now be found in the care plan section) Acute Rehab PT Goals Patient Stated Goal: to go home PT Goal Formulation: With patient Time For Goal Achievement: 08/19/16 Potential to Achieve Goals: Good Progress towards PT goals: Progressing toward goals    Frequency    Min 3X/week      PT Plan Current plan remains appropriate    Co-evaluation             End of Session   Activity Tolerance: Patient tolerated treatment well Patient left: in bed;with call bell/phone within reach;with bed alarm set     Time: 1345-1400 PT Time Calculation (min) (ACUTE ONLY): 15 min  Charges:  $Gait Training: 8-22 mins                    G Codes:      Javonni Macke 08-23-16, 2:12 PM

## 2016-08-07 NOTE — Progress Notes (Signed)
Progress Note  Patient Name: Brittany [name] Date of Encounter: 08/07/2016  Primary Cardiologist: Dr. Tamala Julian  Subjective   Reports breathing has significantly improved. Going to the restroom regularly. No chest discomfort or palpitations overnight or this morning.   Inpatient Medications    Scheduled Meds: . amiodarone  200 mg Oral Daily  . aspirin EC  325 mg Oral Daily  . cholecalciferol  2,000 Units Oral Daily  . donepezil  10 mg Oral QHS  . enoxaparin (LOVENOX) injection  30 mg Subcutaneous Q24H  . feeding supplement  1 Container Oral Daily  . feeding supplement (OSMOLITE 1.5 CAL)  237 mL Oral BID  . furosemide  40 mg Intravenous Daily  . hydrochlorothiazide  25 mg Oral Daily  . lamoTRIgine  100 mg Oral BID  . latanoprost  1 drop Both Eyes QHS  . levETIRAcetam  750 mg Oral BID  . lipase/protease/amylase  12,000 Units Oral TID WC  . memantine  10 mg Oral BID  . metoprolol tartrate  25 mg Oral BID  . potassium chloride  40 mEq Oral Once  . QUEtiapine  12.5 mg Oral QPM  . sodium chloride flush  3 mL Intravenous Q12H  . sodium chloride flush  3 mL Intravenous Q12H   Continuous Infusions:  PRN Meds: sodium chloride, acetaminophen **OR** acetaminophen, hydrALAZINE, ondansetron **OR** ondansetron (ZOFRAN) IV, sodium chloride flush   Vital Signs    Vitals:   08/06/16 0917 08/06/16 1336 08/06/16 2103 08/07/16 0555  BP: 100/82 98/74 106/68 96/61  Pulse: 72 62 65 75  Resp:  '18 18 18  '$ Temp:  97.8 F (36.6 C) 98.2 F (36.8 C) 97.7 F (36.5 C)  TempSrc:  Oral Oral Oral  SpO2:  93% 92% 92%  Weight:    92 lb 14.4 oz (42.1 kg)  Height:        Intake/Output Summary (Last 24 hours) at 08/07/16 0812 Last data filed at 08/07/16 0455  Gross per 24 hour  Intake              720 ml  Output             1500 ml  Net             -780 ml   Filed Weights   08/04/16 1556 08/06/16 0439 08/07/16 0555  Weight: 98 lb 12.3 oz (44.8 kg) 91 lb 4.3 oz (41.4 kg) 92 lb 14.4 oz (42.1 kg)     Telemetry    Atrial fibrillation, HR in 90's to 110's.  - Personally Reviewed  ECG    1/19: Atrial fibrillation, HR 95. - Personally Reviewed  Physical Exam   GEN: Chronically-ill appearing, thin African American female, currently in no acute distress.  Neck: No JVD. No carotid bruits appreciated.  Cardiac: Irregularly irregular, mildly tachycardiac. No murmurs, rubs, or gallops.  Respiratory: Respirations regular and unlabored. Clear to auscultation bilaterally without wheezing or rales. GI: Soft, nontender, non-distended. BS x4. MS: No edema; No deformity. Neuro:  AAOx2 (person, place). Psych: Normal affect  Labs    Chemistry Recent Labs Lab 08/04/16 1245 08/04/16 1700 08/05/16 0556 08/07/16 0536  NA 135  --  138 138  K 4.9  --  4.0 3.4*  CL 102  --  99* 97*  CO2 26  --  22 29  GLUCOSE 95  --  95 99  BUN 23*  --  24* 41*  CREATININE 1.39* 1.30* 1.49* 1.87*  CALCIUM 9.0  --  9.6 9.3  PROT 7.4  --  7.5  --   ALBUMIN 4.1  --  4.3  --   AST 38  --  40  --   ALT 28  --  27  --   ALKPHOS 84  --  91  --   BILITOT 0.8  --  0.6  --   GFRNONAA 41* 44* 38* 29*  GFRAA 47* 51* 44* 33*  ANIONGAP 7  --  17* 12     Hematology Recent Labs Lab 08/04/16 1245 08/04/16 1700 08/05/16 0556  WBC 5.3 6.0 5.9  RBC 4.09 4.51 4.62  HGB 11.2* 11.9* 12.3  HCT 34.2* 37.4 38.4  MCV 83.6 82.9 83.1  MCH 27.4 26.4 26.6  MCHC 32.7 31.8 32.0  RDW 15.2 15.2 15.1  PLT 329 352 284    Cardiac EnzymesNo results for input(s): TROPONINI in the last 168 hours.  Recent Labs Lab 08/04/16 1252  TROPIPOC 0.01     BNP Recent Labs Lab 08/04/16 1245  BNP 1,367.2*     DDimer No results for input(s): DDIMER in the last 168 hours.   Radiology    No results found.  Cardiac Studies   Echocardiogram: 07/21/2016 Study Conclusions  - Left ventricle: The cavity size was normal. Wall thickness was   increased in a pattern of severe LVH. Systolic function was   normal. The  estimated ejection fraction was in the range of 50%   to 55%. The study is not technically sufficient to allow   evaluation of LV diastolic function. - Mitral valve: There was moderate regurgitation. - Left atrium: The atrium was moderately dilated. - Right atrium: The atrium was moderately dilated. - Atrial septum: No defect or patent foramen ovale was identified. - Pulmonary arteries: PA peak pressure: 53 mm Hg (S).  Patient Profile     61 y.o. female w/ PMH of dementia, seizures, chronic diastolic CHF, persistent atrial flutter, HTN, CVA, CKD III, COPD, chronic alcoholism, PAD, and recent admission from 1/5 - 07/26/2016 for cardiogenic shock who presented back on 08/04/2016 for worsening dyspnea, and admitted for acute on chronic diastolic CHF.   Assessment & Plan    1. Acute on chronic diastolic heart failure - EF 50-55% by echo in 07/2016. BNP 1367 on admission with CXR showing progressive bilateral pulmonary infiltrates, most consistent with pulmonary edema. - started on IV Lasix '40mg'$  BID on admission and has been on HCTZ as well. Will stop HCTZ to avoid dual-diuretic therapy. Net output of -3.3L this admission with weight down from 98 lbs --> 92 lbs. Weight 101 lbs at the time of last hospital discharge. Hold Lasix today in the setting of AKI. Can likely resume low-dose PO Lasix tomorrow.   2. Persistent atrial flutter - Based on recent cardiology evaluation during last admission, plan is to treat with rate control strategy in the setting of no anticoagulation.  - This patients CHA2DS2-VASc Score and unadjusted Ischemic Stroke Rate (% per year) is equal to 3.2 % stroke rate/year from a score of 3 (CVA (2), Female). Previously on Xarelto but taken off this by her PCP. Continue ASA.  - HR remains in the 90's to 110's.  - continue Lopressor '25mg'$  BID and Amiodarone '200mg'$  daily. Could consider increasing Lopressor dosing once BP improves.   3. History of relative hypotension - current  medication regimen includes IV Lasix (as above), HCTZ '25mg'$  daily, and Lopressor '25mg'$  BID. Will stop HCTZ as above.   4. Dementia with history of alcohol abuse -  remains on Aricept. - per admitting team.   5. Stage 3 CKD - baseline 1.1- 1.3. Creatinine 1.39 on admission. Peaked at 1.89 today.  - likely secondary to overdiuresis while receiving both Lasix and HCTZ. Will stop HCTZ and hold Lasix today.  - repeat BMET in AM.   Signed, Erma Heritage, PA  08/07/2016, 8:12 AM

## 2016-08-07 NOTE — Progress Notes (Signed)
PROGRESS NOTE    Brittany [name]  HYI:[id number] DOB: 1956-03-22 DOA: 08/04/2016 PCP: Pcp Not In System     Brief Narrative:  Brittany [name] is a 61 y.o. female with medical history significant of diastolic heart failure, who presents with the chief complain of worsening dyspnea. Recent hospitalization with discharge diagnosis of shock and hypotension associated with diarrhea and uncontrolled atrial fibrillation.  She has been at her usual state of health until this am when abruptly felt dyspnea. She admits that she has not been compliant with low salt or fluid restriction.   Assessment & Plan:   Principal Problem:   Acute on chronic diastolic heart failure (HCC) Active Problems:   Atrial flutter (HCC)   Dementia   CKD (chronic kidney disease) stage 3, GFR 30-59 ml/min   Seizures (HCC)  Acute on chronic diastolic heart failure -BNP 1367 on admission  -CXR: Progressive bilateral pulmonary infiltrates most likely pulmonary edema -Daily weights, I/Os  -Appreciate cardiology  -Hold diuretics today in light of worsening Cr function. Likely resume PO lasix tomorrow   Persistent A Flutter  -CHADSVASc 6, but high risk for falls and bleed. At last admission, cardiology decided against anticoagulation.  -Continue amiodarone, aspirin, lopressor   AKI on CKD stage 3 -Baseline Cr 1.1-1.3  -Worsened overnight in setting of dual-diuretic use. Stop diuretics. Monitor.   Dementia -Continue aricept, namenda, seroquel   Seizure disorder -Continue lamictal, keppra    DVT prophylaxis: lovenox Code Status: full Family Communication: no family at bedside Disposition Plan: pending further improvement    Consultants:   Cardiology  Procedures:   None  Antimicrobials:   None     Subjective: Patient has no complaints this morning. No acute events overnight. States that her breathing is better.  Objective: Vitals:   08/06/16 1336 08/06/16 2103 08/07/16 0555 08/07/16 0959  BP:  98/74 1'06/68 96/61 94/78 '$  Pulse: 62 65 75 67  Resp: '18 18 18   '$ Temp: 97.8 F (36.6 C) 98.2 F (36.8 C) 97.7 F (36.5 C)   TempSrc: Oral Oral Oral   SpO2: 93% 92% 92%   Weight:   42.1 kg (92 lb 14.4 oz)   Height:        Intake/Output Summary (Last 24 hours) at 08/07/16 1231 Last data filed at 08/07/16 0854  Gross per 24 hour  Intake              720 ml  Output             1500 ml  Net             -780 ml   Filed Weights   08/04/16 1556 08/06/16 0439 08/07/16 0555  Weight: 44.8 kg (98 lb 12.3 oz) 41.4 kg (91 lb 4.3 oz) 42.1 kg (92 lb 14.4 oz)    Examination:  General exam: Appears calm and comfortable  Respiratory system: Clear to auscultation. Respiratory effort normal. Cardiovascular system: S1 & S2 heard, Irregular rhythm. No JVD, murmurs, rubs, gallops or clicks. No pedal edema. Gastrointestinal system: Abdomen is nondistended, soft and nontender. No organomegaly or masses felt. Normal bowel sounds heard. Central nervous system: Alert and oriented. No focal neurological deficits.  Extremities: Symmetric 5 x 5 power. Skin: No rashes, lesions or ulcers Psychiatry: Poor historian, hx dementia   Data Reviewed: I have personally reviewed following labs and imaging studies  CBC:  Recent Labs Lab 08/04/16 1245 08/04/16 1700 08/05/16 0556  WBC 5.3 6.0 5.9  HGB 11.2* 11.9* 12.3  HCT  34.2* 37.4 38.4  MCV 83.6 82.9 83.1  PLT 329 352 128   Basic Metabolic Panel:  Recent Labs Lab 08/04/16 1245 08/04/16 1700 08/05/16 0556 08/07/16 0536  NA 135  --  138 138  K 4.9  --  4.0 3.4*  CL 102  --  99* 97*  CO2 26  --  22 29  GLUCOSE 95  --  95 99  BUN 23*  --  24* 41*  CREATININE 1.39* 1.30* 1.49* 1.87*  CALCIUM 9.0  --  9.6 9.3   GFR: Estimated Creatinine Clearance: 21.8 mL/min (by C-G formula based on SCr of 1.87 mg/dL (H)). Liver Function Tests:  Recent Labs Lab 08/04/16 1245 08/05/16 0556  AST 38 40  ALT 28 27  ALKPHOS 84 91  BILITOT 0.8 0.6  PROT 7.4  7.5  ALBUMIN 4.1 4.3   No results for input(s): LIPASE, AMYLASE in the last 168 hours. No results for input(s): AMMONIA in the last 168 hours. Coagulation Profile: No results for input(s): INR, PROTIME in the last 168 hours. Cardiac Enzymes: No results for input(s): CKTOTAL, CKMB, CKMBINDEX, TROPONINI in the last 168 hours. BNP (last 3 results) No results for input(s): PROBNP in the last 8760 hours. HbA1C: No results for input(s): HGBA1C in the last 72 hours. CBG: No results for input(s): GLUCAP in the last 168 hours. Lipid Profile: No results for input(s): CHOL, HDL, LDLCALC, TRIG, CHOLHDL, LDLDIRECT in the last 72 hours. Thyroid Function Tests: No results for input(s): TSH, T4TOTAL, FREET4, T3FREE, THYROIDAB in the last 72 hours. Anemia Panel: No results for input(s): VITAMINB12, FOLATE, FERRITIN, TIBC, IRON, RETICCTPCT in the last 72 hours. Sepsis Labs: No results for input(s): PROCALCITON, LATICACIDVEN in the last 168 hours.  No results found for this or any previous visit (from the past 240 hour(s)).     Radiology Studies: No results found.    Scheduled Meds: . amiodarone  200 mg Oral Daily  . aspirin EC  325 mg Oral Daily  . cholecalciferol  2,000 Units Oral Daily  . donepezil  10 mg Oral QHS  . enoxaparin (LOVENOX) injection  30 mg Subcutaneous Q24H  . feeding supplement  1 Container Oral Daily  . feeding supplement (VITAL 1.5 CAL)  237 mL Oral Q24H  . lamoTRIgine  100 mg Oral BID  . latanoprost  1 drop Both Eyes QHS  . levETIRAcetam  750 mg Oral BID  . lipase/protease/amylase  12,000 Units Oral TID WC  . memantine  10 mg Oral BID  . metoprolol tartrate  25 mg Oral BID  . QUEtiapine  12.5 mg Oral QPM  . sodium chloride flush  3 mL Intravenous Q12H  . sodium chloride flush  3 mL Intravenous Q12H   Continuous Infusions:   LOS: 3 days    Time spent: 30 minutes   Dessa Phi, DO [facility] [email or web address] Password [email or web address] 08/07/2016, 12:31 PM

## 2016-08-07 NOTE — Progress Notes (Signed)
Initial Nutrition Assessment  DOCUMENTATION CODES:   Severe malnutrition in context of chronic illness, Underweight  INTERVENTION:   -D/c Osmolite 1.5 -Provide Vital 1.5 once daily, provides 1500 kcal and 67.5 g protein. -Continue Boost Breeze po once, each supplement provides 250 kcal and 9 grams of protein -Placed order for lunch -RD to continue to monitor  NUTRITION DIAGNOSIS:   Malnutrition related to chronic illness as evidenced by severe depletion of body fat, severe depletion of muscle mass, percent weight loss.  GOAL:   Patient will meet greater than or equal to 90% of their needs  MONITOR:   PO intake, Supplement acceptance, Labs, Weight trends, Skin, I & O's  REASON FOR ASSESSMENT:   Other (Comment) (Low BMI)    ASSESSMENT:   61 y.o. female with medical history significant of diastolic heart failure, who presents with the chief complain of worsening dyspnea. Recent hospitalization with discharge diagnosis of shock and hypotension associated with diarrhea and uncontrolled atrial fibrillation.   Patient reports good appetite and she eats well. Pt reports drinking Vital 1.5 PO. She has been ordered Osmolite 1.5 and requests her Vital. States he also like the Boost Breeze supplements. Pt has not ordered lunch and RD offered to place order for patient. Pt denies any swallowing or chewing issues at this time.   Per chart review, pt has lost 9 lb since 1/10 (9% wt loss x 12 days, significant for time frame). Nutrition-Focused physical exam completed. Findings are severe fat depletion, severe muscle depletion, and no edema.   Medications: vitamin D tablet daily,  CREON capsule TID Labs reviewed: Low K GFR: 29  Diet Order:  Diet Heart Room service appropriate? Yes; Fluid consistency: Thin  Skin:  Reviewed, no issues  Last BM:  1/21  Height:   Ht Readings from Last 1 Encounters:  08/04/16 '5\' 3"'$  (1.6 m)    Weight:   Wt Readings from Last 1 Encounters:   08/07/16 92 lb 14.4 oz (42.1 kg)    Ideal Body Weight:  52.3 kg  BMI:  Body mass index is 16.46 kg/m.  Estimated Nutritional Needs:   Kcal:  1600-1800  Protein:  70-80g  Fluid:  1.6-1.8L/day  EDUCATION NEEDS:   No education needs identified at this time  Clayton Bibles, MS, RD, LDN Pager: [phone] After Hours Pager: [phone]

## 2016-08-08 ENCOUNTER — Encounter: Payer: Self-pay | Admitting: Physician Assistant

## 2016-08-08 LAB — BASIC METABOLIC PANEL
Anion gap: 13 (ref 5–15)
BUN: 41 mg/dL — AB (ref 6–20)
CHLORIDE: 98 mmol/L — AB (ref 101–111)
CO2: 24 mmol/L (ref 22–32)
CREATININE: 1.72 mg/dL — AB (ref 0.44–1.00)
Calcium: 9.5 mg/dL (ref 8.9–10.3)
GFR calc Af Amer: 37 mL/min — ABNORMAL LOW (ref >60)
GFR calc non Af Amer: 32 mL/min — ABNORMAL LOW (ref >60)
Glucose, Bld: 84 mg/dL (ref 65–99)
Potassium: 4.6 mmol/L (ref 3.5–5.1)
SODIUM: 135 mmol/L (ref 135–145)

## 2016-08-08 NOTE — Progress Notes (Signed)
Progress Note  Patient Name: Brittany [name] Date of Encounter: 08/08/2016  Primary Cardiologist: Dr. Tamala Julian  Subjective   No chest discomfort or palpitations. Breathing at baseline.   Inpatient Medications    Scheduled Meds: . amiodarone  200 mg Oral Daily  . aspirin EC  325 mg Oral Daily  . cholecalciferol  2,000 Units Oral Daily  . donepezil  10 mg Oral QHS  . enoxaparin (LOVENOX) injection  30 mg Subcutaneous Q24H  . feeding supplement  1 Container Oral Daily  . feeding supplement (OSMOLITE 1.5 CAL)  237 mL Oral Q24H  . lamoTRIgine  100 mg Oral BID  . latanoprost  1 drop Both Eyes QHS  . levETIRAcetam  750 mg Oral BID  . lipase/protease/amylase  12,000 Units Oral TID WC  . memantine  10 mg Oral BID  . metoprolol tartrate  25 mg Oral BID  . QUEtiapine  12.5 mg Oral QPM  . sodium chloride flush  3 mL Intravenous Q12H  . sodium chloride flush  3 mL Intravenous Q12H   Continuous Infusions:  PRN Meds: sodium chloride, acetaminophen **OR** acetaminophen, hydrALAZINE, ondansetron **OR** ondansetron (ZOFRAN) IV, sodium chloride flush   Vital Signs    Vitals:   08/07/16 0959 08/07/16 1441 08/07/16 2007 08/08/16 0532  BP: '94/78 91/70 98/64 '$ 105/74  Pulse: 67 63 (!) 111 82  Resp:  '18 18 18  '$ Temp:  98.9 F (37.2 C) 98.1 F (36.7 C) 97.3 F (36.3 C)  TempSrc:  Oral Oral Oral  SpO2:  95% 98% 100%  Weight:    98 lb 8.7 oz (44.7 kg)  Height:        Intake/Output Summary (Last 24 hours) at 08/08/16 1059 Last data filed at 08/08/16 1041  Gross per 24 hour  Intake              240 ml  Output             1200 ml  Net             -960 ml   Filed Weights   08/06/16 0439 08/07/16 0555 08/08/16 0532  Weight: 91 lb 4.3 oz (41.4 kg) 92 lb 14.4 oz (42.1 kg) 98 lb 8.7 oz (44.7 kg)    Telemetry    Atrial fibrillation, HR in 90's to 110's.  - Personally Reviewed  ECG    No new tracings.   Physical Exam   General: Well developed, well nourished, female appearing in no  acute distress. Head: Normocephalic, atraumatic.  Neck: Supple without bruits, JVD not elevated. Lungs:  Resp regular and unlabored, CTA without wheezing or rales. Heart: Irregularly irregular, S1, S2, no S3, S4, or murmur; no rub. Abdomen: Soft, non-tender, non-distended with normoactive bowel sounds. No hepatomegaly. No rebound/guarding. No obvious abdominal masses. Extremities: No clubbing, cyanosis, or edema. Distal pedal pulses are 2+ bilaterally. Neuro: Alert and oriented X 3. Moves all extremities spontaneously. Psych: Normal affect.  Labs    Chemistry Recent Labs Lab 08/04/16 1245  08/05/16 0556 08/07/16 0536 08/08/16 0529  NA 135  --  138 138 135  K 4.9  --  4.0 3.4* 4.6  CL 102  --  99* 97* 98*  CO2 26  --  '22 29 24  '$ GLUCOSE 95  --  95 99 84  BUN 23*  --  24* 41* 41*  CREATININE 1.39*  < > 1.49* 1.87* 1.72*  CALCIUM 9.0  --  9.6 9.3 9.5  PROT 7.4  --  7.5  --   --   ALBUMIN 4.1  --  4.3  --   --   AST 38  --  40  --   --   ALT 28  --  27  --   --   ALKPHOS 84  --  91  --   --   BILITOT 0.8  --  0.6  --   --   GFRNONAA 41*  < > 38* 29* 32*  GFRAA 47*  < > 44* 33* 37*  ANIONGAP 7  --  17* 12 13  < > = values in this interval not displayed.   Hematology Recent Labs Lab 08/04/16 1245 08/04/16 1700 08/05/16 0556  WBC 5.3 6.0 5.9  RBC 4.09 4.51 4.62  HGB 11.2* 11.9* 12.3  HCT 34.2* 37.4 38.4  MCV 83.6 82.9 83.1  MCH 27.4 26.4 26.6  MCHC 32.7 31.8 32.0  RDW 15.2 15.2 15.1  PLT 329 352 284    Cardiac EnzymesNo results for input(s): TROPONINI in the last 168 hours.  Recent Labs Lab 08/04/16 1252  TROPIPOC 0.01     BNP Recent Labs Lab 08/04/16 1245  BNP 1,367.2*     DDimer No results for input(s): DDIMER in the last 168 hours.   Radiology    No results found.  Cardiac Studies   Echocardiogram: 07/21/2016 Study Conclusions  - Left ventricle: The cavity size was normal. Wall thickness was increased in a pattern of severe LVH. Systolic  function was normal. The estimated ejection fraction was in the range of 50% to 55%. The study is not technically sufficient to allow evaluation of LV diastolic function. - Mitral valve: There was moderate regurgitation. - Left atrium: The atrium was moderately dilated. - Right atrium: The atrium was moderately dilated. - Atrial septum: No defect or patent foramen ovale was identified. - Pulmonary arteries: PA peak pressure: 53 mm Hg (S).  Patient Profile     61 y.o. female w/ PMH of dementia, seizures, chronic diastolic CHF, persistent atrial flutter, HTN, CVA, CKD III, COPD, chronic alcoholism, PAD, and recent admission from 1/5 - 07/26/2016 for cardiogenic shock who presented back on 08/04/2016 for worsening dyspnea, and admitted for acute on chronic diastolic CHF.   Assessment & Plan    1. Acute on chronic diastolic heart failure - EF 50-55% by echo in 07/2016. BNP 1367 on admission with CXR showing progressive bilateral pulmonary infiltrates, most consistent with pulmonary edema. - started on IV Lasix '40mg'$  BID on admission and has been on HCTZ as well. Stopped HCTZ on 1/22  to avoid dual-diuretic therapy. Net output of -4.5L this admission with variable weights recorded. Weight 101 lbs at the time of last hospital discharge. Creatinine remains elevated at 1.72 today. Continue to hold Lasix. Possibly resume PO Lasix tomorrow pending kidney function.   2. Persistent atrial flutter - Based on recent cardiology evaluation during last admission, plan is to treat with rate control strategy in the setting of no anticoagulation.  - This patients CHA2DS2-VASc Score and unadjusted Ischemic Stroke Rate (% per year) is equal to 3.2 % stroke rate/year from a score of 3 (CVA (2), Female). Previously on Xarelto but taken off this by her PCP. Continue ASA.  - HR remains in the 90's to 110's.  - continue Lopressor '25mg'$  BID and Amiodarone '200mg'$  daily. BP does not allow for titration of Lopressor at  this time.  3. History of relative hypotension - current medication regimen includes Lopressor '25mg'$  BID. Will  stop HCTZ as above (not on this PTA).   4. Dementia with history of alcohol abuse - remains on Aricept. - per admitting team.   5. Stage 3 CKD - baseline 1.1- 1.3. Creatinine 1.39 on admission. Peaked at 1.89 on 1/22 in the setting of being on Lasix and HCTZ and remains elevated at 1.72 on 1/23.  HCTZ discontinued and Lasix held. - repeat BMET in AM.    Signed, Erma Heritage , PA-C 10:59 AM 08/08/2016 Pager: [phone]

## 2016-08-08 NOTE — Progress Notes (Signed)
PROGRESS NOTE    Brittany [name]  LEX:[id number] DOB: 1956/03/10 DOA: 08/04/2016 PCP: Pcp Not In System     Brief Narrative:  Brittany [name] is a 61 y.o. female with medical history significant of diastolic heart failure, who presents with the chief complain of worsening dyspnea. Recent hospitalization with discharge diagnosis of shock and hypotension associated with diarrhea and uncontrolled atrial fibrillation.  She has been at her usual state of health until day of admission when abruptly felt dyspnea. She admits that she has not been compliant with low salt or fluid restriction.   She was admitted for acute on chronic diastolic heart failure, she diuresed with IV Lasix, which had to be stopped due to worsening creatinine function. Cardiology has been following.  Assessment & Plan:   Principal Problem:   Acute on chronic diastolic heart failure (HCC) Active Problems:   Atrial flutter (HCC)   Dementia   CKD (chronic kidney disease) stage 3, GFR 30-59 ml/min   Seizures (HCC)   Acute renal failure with acute tubular necrosis superimposed on stage 3 chronic kidney disease (HCC)  Acute on chronic diastolic heart failure -BNP 1367 on admission  -CXR: Progressive bilateral pulmonary infiltrates most likely pulmonary edema -Daily weights, I/Os  -Appreciate cardiology  -Hold diuretics in light of worsening Cr function. Likely resume PO lasix tomorrow pending Cr   Persistent A Flutter  -CHADSVASc 6, but high risk for falls and bleed. At last admission, cardiology decided against anticoagulation.  -Continue amiodarone, aspirin, lopressor   AKI on CKD stage 3 -Baseline Cr 1.1-1.3  -Worsened overnight in setting of dual-diuretic use. Stop diuretics. Trend BMP   Dementia -Continue aricept, namenda, seroquel   Seizure disorder -Continue lamictal, keppra    DVT prophylaxis: lovenox Code Status: full Family Communication: no family at bedside Disposition Plan: pending further  improvement    Consultants:   Cardiology  Procedures:   None  Antimicrobials:   None     Subjective: Patient has no complaints this morning. No acute events overnight. States that her breathing is better. Frustrated at her current state of health   Objective: Vitals:   08/07/16 0959 08/07/16 1441 08/07/16 2007 08/08/16 0532  BP: '94/78 91/70 98/64 '$ 105/74  Pulse: 67 63 (!) 111 82  Resp:  '18 18 18  '$ Temp:  98.9 F (37.2 C) 98.1 F (36.7 C) 97.3 F (36.3 C)  TempSrc:  Oral Oral Oral  SpO2:  95% 98% 100%  Weight:    44.7 kg (98 lb 8.7 oz)  Height:        Intake/Output Summary (Last 24 hours) at 08/08/16 1311 Last data filed at 08/08/16 1041  Gross per 24 hour  Intake              240 ml  Output             1000 ml  Net             -760 ml   Filed Weights   08/06/16 0439 08/07/16 0555 08/08/16 0532  Weight: 41.4 kg (91 lb 4.3 oz) 42.1 kg (92 lb 14.4 oz) 44.7 kg (98 lb 8.7 oz)    Examination:  General exam: Appears calm and comfortable, cachectic  Respiratory system: Clear to auscultation. Respiratory effort normal. Cardiovascular system: S1 & S2 heard, Irregular rhythm, tachycardic, No JVD, murmurs, rubs, gallops or clicks. No pedal edema. Gastrointestinal system: Abdomen is nondistended, soft and nontender. No organomegaly or masses felt. Normal bowel sounds heard. Central nervous system: Alert  and oriented. No focal neurological deficits.  Extremities: Symmetric 5 x 5 power. Skin: No rashes, lesions or ulcers Psychiatry: Poor historian, hx dementia   Data Reviewed: I have personally reviewed following labs and imaging studies  CBC:  Recent Labs Lab 08/04/16 1245 08/04/16 1700 08/05/16 0556  WBC 5.3 6.0 5.9  HGB 11.2* 11.9* 12.3  HCT 34.2* 37.4 38.4  MCV 83.6 82.9 83.1  PLT 329 352 865   Basic Metabolic Panel:  Recent Labs Lab 08/04/16 1245 08/04/16 1700 08/05/16 0556 08/07/16 0536 08/08/16 0529  NA 135  --  138 138 135  K 4.9  --  4.0 3.4*  4.6  CL 102  --  99* 97* 98*  CO2 26  --  '22 29 24  '$ GLUCOSE 95  --  95 99 84  BUN 23*  --  24* 41* 41*  CREATININE 1.39* 1.30* 1.49* 1.87* 1.72*  CALCIUM 9.0  --  9.6 9.3 9.5   GFR: Estimated Creatinine Clearance: 25.2 mL/min (by C-G formula based on SCr of 1.72 mg/dL (H)). Liver Function Tests:  Recent Labs Lab 08/04/16 1245 08/05/16 0556  AST 38 40  ALT 28 27  ALKPHOS 84 91  BILITOT 0.8 0.6  PROT 7.4 7.5  ALBUMIN 4.1 4.3   No results for input(s): LIPASE, AMYLASE in the last 168 hours. No results for input(s): AMMONIA in the last 168 hours. Coagulation Profile: No results for input(s): INR, PROTIME in the last 168 hours. Cardiac Enzymes: No results for input(s): CKTOTAL, CKMB, CKMBINDEX, TROPONINI in the last 168 hours. BNP (last 3 results) No results for input(s): PROBNP in the last 8760 hours. HbA1C: No results for input(s): HGBA1C in the last 72 hours. CBG: No results for input(s): GLUCAP in the last 168 hours. Lipid Profile: No results for input(s): CHOL, HDL, LDLCALC, TRIG, CHOLHDL, LDLDIRECT in the last 72 hours. Thyroid Function Tests: No results for input(s): TSH, T4TOTAL, FREET4, T3FREE, THYROIDAB in the last 72 hours. Anemia Panel: No results for input(s): VITAMINB12, FOLATE, FERRITIN, TIBC, IRON, RETICCTPCT in the last 72 hours. Sepsis Labs: No results for input(s): PROCALCITON, LATICACIDVEN in the last 168 hours.  No results found for this or any previous visit (from the past 240 hour(s)).     Radiology Studies: No results found.    Scheduled Meds: . amiodarone  200 mg Oral Daily  . aspirin EC  325 mg Oral Daily  . cholecalciferol  2,000 Units Oral Daily  . donepezil  10 mg Oral QHS  . enoxaparin (LOVENOX) injection  30 mg Subcutaneous Q24H  . feeding supplement  1 Container Oral Daily  . feeding supplement (OSMOLITE 1.5 CAL)  237 mL Oral Q24H  . lamoTRIgine  100 mg Oral BID  . latanoprost  1 drop Both Eyes QHS  . levETIRAcetam  750 mg Oral  BID  . lipase/protease/amylase  12,000 Units Oral TID WC  . memantine  10 mg Oral BID  . metoprolol tartrate  25 mg Oral BID  . QUEtiapine  12.5 mg Oral QPM  . sodium chloride flush  3 mL Intravenous Q12H  . sodium chloride flush  3 mL Intravenous Q12H   Continuous Infusions:   LOS: 4 days    Time spent: 30 minutes   Dessa Phi, DO [facility] [email or web address] Password [email or web address] 08/08/2016, 1:11 PM

## 2016-08-08 NOTE — Care Management Note (Signed)
Case Management Note  Patient Details  Name: Brittany [name] MRN: [id number] Date of Birth: 12/30/1955  Subjective/Objective: 61 y/o f admitted w/CHF. From home. Readmit-syncope.PT-recc supv.                    Action/Plan:d/c plan home.   Expected Discharge Date:   (unknown)               Expected Discharge Plan:  Home/Self Care  In-House Referral:     Discharge planning Services  CM Consult  Post Acute Care Choice:    Choice offered to:     DME Arranged:    DME Agency:     HH Arranged:    HH Agency:     Status of Service:  In process, will continue to follow  If discussed at Long Length of Stay Meetings, dates discussed:    Additional Comments:  Dessa Phi, RN 08/08/2016, 12:23 PM

## 2016-08-09 ENCOUNTER — Telehealth: Payer: Self-pay | Admitting: Interventional Cardiology

## 2016-08-09 DIAGNOSIS — F1097 Alcohol use, unspecified with alcohol-induced persisting dementia: Secondary | ICD-10-CM

## 2016-08-09 DIAGNOSIS — N183 Chronic kidney disease, stage 3 (moderate): Secondary | ICD-10-CM

## 2016-08-09 DIAGNOSIS — R569 Unspecified convulsions: Secondary | ICD-10-CM

## 2016-08-09 DIAGNOSIS — I5033 Acute on chronic diastolic (congestive) heart failure: Secondary | ICD-10-CM

## 2016-08-09 DIAGNOSIS — I483 Typical atrial flutter: Secondary | ICD-10-CM

## 2016-08-09 DIAGNOSIS — N170 Acute kidney failure with tubular necrosis: Secondary | ICD-10-CM

## 2016-08-09 LAB — BASIC METABOLIC PANEL
ANION GAP: 10 (ref 5–15)
BUN: 41 mg/dL — ABNORMAL HIGH (ref 6–20)
CALCIUM: 9.7 mg/dL (ref 8.9–10.3)
CO2: 27 mmol/L (ref 22–32)
Chloride: 99 mmol/L — ABNORMAL LOW (ref 101–111)
Creatinine, Ser: 1.63 mg/dL — ABNORMAL HIGH (ref 0.44–1.00)
GFR, EST AFRICAN AMERICAN: 39 mL/min — AB (ref >60)
GFR, EST NON AFRICAN AMERICAN: 34 mL/min — AB (ref >60)
GLUCOSE: 93 mg/dL (ref 65–99)
POTASSIUM: 4.2 mmol/L (ref 3.5–5.1)
SODIUM: 136 mmol/L (ref 135–145)

## 2016-08-09 MED ORDER — FUROSEMIDE 20 MG PO TABS: 20.0000 mg | ORAL_TABLET | Freq: Every day | ORAL | 0 refills | Status: DC

## 2016-08-09 MED ORDER — METOPROLOL TARTRATE 25 MG PO TABS: 25.0000 mg | ORAL_TABLET | Freq: Two times a day (BID) | ORAL | 0 refills | Status: DC

## 2016-08-09 NOTE — Discharge Summary (Signed)
Physician Discharge Summary  Brittany [name]:[id number] DOB: 1956/03/13 DOA: 08/04/2016  PCP: Pcp Not In System  Admit date: 08/04/2016 Discharge date: 08/09/2016  Admitted From: Home Disposition:  Home   Recommendations for Outpatient Follow-up:  1. Follow up with PCP in 1-2 weeks 2. Restart lasix on 08/11/16 3. BMP on Monday, 08/14/16 4. Weigh self daily, please abstain from salt  Home Health: No Equipment/Devices: None  Discharge Condition: Stable CODE STATUS: Full Code Diet recommendation: Heart Healthy- salt restriction  Brief/Interim Summary: Brittany Robinsonis a 61 y.o.femalewith medical history significant of diastolic heart failure, who presents with the chief complain of worsening dyspnea. Recent hospitalization with discharge diagnosis of shock and hypotension associated with diarrhea and uncontrolled atrial fibrillation.  She has been at her usual state of health until day of admission when abruptly felt dyspnea. She admits that she has not been compliant with low salt or fluid restriction.   She was admitted for acute on chronic diastolic heart failure, she diuresed with IV Lasix, which had to be stopped due to worsening creatinine function. Cardiology has been following.  Discharge Diagnoses:  Principal Problem:   Acute on chronic diastolic heart failure (HCC) Active Problems:   Atrial flutter (HCC)   Dementia   CKD (chronic kidney disease) stage 3, GFR 30-59 ml/min   Seizures (HCC)   Acute renal failure with acute tubular necrosis superimposed on stage 3 chronic kidney disease (HCC)  Acute on chronic diastolic heart failure -BNP 1367 on admission  -CXR: Progressive bilateral pulmonary infiltrates most likely pulmonary edema -Daily weights, I/Os  -Appreciate cardiology   restart lasix on 08/11/16   Persistent A Flutter  -CHADSVASc 6, but high risk for falls and bleed. At last admission, cardiology decided against anticoagulation.  -Continue amiodarone,  aspirin, lopressor   AKI on CKD stage 3 -Baseline Cr 1.1-1.3  -Worsened overnight in setting of dual-diuretic use. Stop diuretics. Trend BMP   Dementia -Continue aricept, namenda, seroquel   Seizure disorder -Continue lamictal, keppra   Discharge Instructions  Discharge Instructions    Call MD for:  difficulty breathing, headache or visual disturbances    Complete by:  As directed    Call MD for:  extreme fatigue    Complete by:  As directed    Call MD for:  hives    Complete by:  As directed    Call MD for:  persistant dizziness or light-headedness    Complete by:  As directed    Call MD for:  persistant nausea and vomiting    Complete by:  As directed    Call MD for:  severe uncontrolled pain    Complete by:  As directed    Call MD for:  temperature >100.4    Complete by:  As directed    Diet - low sodium heart healthy    Complete by:  As directed    Please abstain from salt in diet at home   Increase activity slowly    Complete by:  As directed      Allergies as of 08/09/2016   No Known Allergies     Medication List    TAKE these medications   acetaminophen 500 MG tablet Commonly known as:  TYLENOL Take 1,000 mg by mouth 2 (two) times daily as needed (for various aches and pains).   amiodarone 200 MG tablet Commonly known as:  PACERONE Take 1 tablet (200 mg total) by mouth daily. Start taking 07/30/15 What changed:  Another medication with the same  name was removed. Continue taking this medication, and follow the directions you see here.   aspirin EC 325 MG tablet Take 325 mg by mouth daily.   CREON 24000-76000 units Cpep Generic drug:  Pancrelipase (Lip-Prot-Amyl) Take 1 capsule by mouth 3 (three) times daily with meals. FOR DIGESTION   donepezil 10 MG tablet Commonly known as:  ARICEPT Take 10 mg by mouth at bedtime.   feeding supplement (VITAL 1.5 CAL) Liqd Take 237 mLs by mouth 2 (two) times daily.   ENSURE CLEAR Liqd Take 1 Can by mouth  daily.   furosemide 20 MG tablet Commonly known as:  LASIX Take 1 tablet (20 mg total) by mouth daily.   lamoTRIgine 100 MG tablet Commonly known as:  LAMICTAL Take 100 mg by mouth 2 (two) times daily.   latanoprost 0.005 % ophthalmic solution Commonly known as:  XALATAN Place 1 drop into both eyes at bedtime.   levETIRAcetam 750 MG tablet Commonly known as:  KEPPRA Take 750 mg by mouth 2 (two) times daily.   memantine 10 MG tablet Commonly known as:  NAMENDA Take 10 mg by mouth 2 (two) times daily.   metoprolol tartrate 25 MG tablet Commonly known as:  LOPRESSOR Take 1 tablet (25 mg total) by mouth 2 (two) times daily.   QUEtiapine 25 MG tablet Commonly known as:  SEROQUEL Take 12.5 mg by mouth every evening.   Vitamin D3 2000 units Tabs Take 2,000 Units by mouth daily.      Follow-up Information    [name], NP Follow up on 08/16/2016.   Specialties:  Nurse Practitioner, Interventional Cardiology, Cardiology, Radiology Why:  at 8:30 AM this is one of Dr. Thompson Caul Nurse Practitioners Contact information: [place] [place] [address] [phone]          No Known Allergies  Consultations:  Cardiology  PT   Procedures/Studies: Dg Chest 2 View  Result Date: 08/04/2016 CLINICAL DATA:  Shortness of breath and chest tightness. EXAM: CHEST  2 VIEW COMPARISON:  07/22/2016 and 07/21/2016 FINDINGS: The patient has progressive bilateral pulmonary infiltrates, most consistent with pulmonary edema. Cardiomegaly. Slight pulmonary vascular prominence. The lungs are somewhat hyperinflated with flattening of the diaphragm. Small bilateral pleural effusions. IMPRESSION: Progressive bilateral pulmonary infiltrates most likely pulmonary edema. New small bilateral effusions. COPD. Electronically Signed   By: Lorriane Shire M.D.   On: 08/04/2016 13:02   Ct Head Wo Contrast  Result Date: 07/21/2016 CLINICAL DATA:  Syncope, hypotensive and septic.   No headache. EXAM: CT HEAD WITHOUT CONTRAST TECHNIQUE: Contiguous axial images were obtained from the base of the skull through the vertex without intravenous contrast. COMPARISON:  None. FINDINGS: BRAIN: There is sulcal and ventricular prominence consistent with superficial and central atrophy. No intraparenchymal hemorrhage, mass effect nor midline shift. Mild degree of periventricular and subcortical white matter hypodensities consistent with chronic small vessel ischemic disease are identified. No acute large vascular territory infarcts. No abnormal extra-axial fluid collections. Basal cisterns are not effaced and midline. Normal variant cavum septum pellucidum and vergae. VASCULAR: Moderate calcific atherosclerosis of the carotid siphons. SKULL: No skull fracture. No significant scalp soft tissue swelling. SINUSES/ORBITS: The mastoid air-cells are clear. The included paranasal sinuses are well-aerated.The included ocular globes and orbital contents are non-suspicious. OTHER: None. IMPRESSION: Likely chronic small vessel ischemic disease of periventricular white matter with cerebral atrophy. Electronically Signed   By: Ashley Royalty M.D.   On: 07/21/2016 17:20   Dg Chest [facility]  Result Date: 07/22/2016 CLINICAL DATA:  Pt states she "originally came to the hospital because she passed out and thought she was having a stroke." No current chest complaints. RN states Dr Lu Duffel be wanting to evaluate fluid levels. EXAM: PORTABLE CHEST 1 VIEW COMPARISON:  07/21/2016. FINDINGS: Mild hazy airspace opacity developed in the right mid to lower lung. Mild interstitial thickening in the lower lungs is stable. Lungs are hyperexpanded. Cardiac silhouette is mildly enlarged. Left internal jugular central venous line is stable. No pneumothorax.  No definite pleural effusion. IMPRESSION: 1. Mild hazy airspace opacity has developed in the right mid to lower lung. This may be from layering pleural fluid. Mild airspace edema is  possible. 2. No other change. Mild persistent lower lung interstitial thickening. Overall, findings again suggests mild congestive heart failure. Electronically Signed   By: Lajean Manes M.D.   On: 07/22/2016 07:59   Dg Chest Port 1 View  Result Date: 07/21/2016 CLINICAL DATA:  Central line placement EXAM: PORTABLE CHEST 1 VIEW COMPARISON:  07/21/2016 FINDINGS: There is a left jugular central venous catheter with the tip projecting over the SVC. There is bilateral diffuse interstitial thickening. There is a trace right pleural effusion. There is no left pleural effusion. There is no pneumothorax. There is mild stable cardiomegaly. The osseous structures are unremarkable. IMPRESSION: Mild CHF. Left jugular central venous catheter with the tip projecting over the SVC. Electronically Signed   By: Kathreen Devoid   On: 07/21/2016 15:12   Dg Chest Port 1 View  Result Date: 07/21/2016 CLINICAL DATA:  Syncope.  Chest pain.  Shortness of breath. EXAM: PORTABLE CHEST 1 VIEW COMPARISON:  None. FINDINGS: There is borderline cardiomegaly. Pulmonary vascularity is normal and the lungs are clear. No acute bone abnormality. Old nonunion fracture of the distal right clavicle. IMPRESSION: Borderline cardiomegaly.  No other significant abnormalities. Electronically Signed   By: Lorriane Shire M.D.   On: 07/21/2016 11:23      Subjective: Patient is laying in bed.  Able to ambulate without dyspnea.  Says she would like instructions at discharge of what she needs to do to stay healthy.  Per case management patient [facility] part of [facility].   Discharge Exam: Vitals:   08/09/16 0913 08/09/16 1533  BP: 107/78 98/72  Pulse: 62 91  Resp:  18  Temp:  98 F (36.7 C)   Vitals:   08/08/16 2227 08/09/16 0428 08/09/16 0913 08/09/16 1533  BP: (!) 87/59 121/81 107/78 98/72  Pulse: 86 91 62 91  Resp: '20 20  18  '$ Temp: 97.7 F (36.5 C) 97.7 F (36.5 C)  98 F (36.7 C)  TempSrc: Oral Oral  Oral  SpO2: 100% 100%  91%   Weight:  44.3 kg (97 lb 10.6 oz)    Height:        General: Pt is alert, awake, not in acute distress Cardiovascular: RRR, S1/S2 +, no rubs, no gallops Respiratory: CTA bilaterally, no wheezing, no rhonchi Abdominal: Soft, NT, ND, bowel sounds + Extremities: no edema, no cyanosis    The results of significant diagnostics from this hospitalization (including imaging, microbiology, ancillary and laboratory) are listed below for reference.     Microbiology: No results found for this or any previous visit (from the past 240 hour(s)).   Labs: BNP (last 3 results)  Recent Labs  07/21/16 1746 08/04/16 1245  BNP 2,207.7* 2,637.8*   Basic Metabolic Panel:  Recent Labs Lab 08/04/16 1245 08/04/16 1700 08/05/16 0556 08/07/16 0536 08/08/16  0529 08/09/16 0554  NA 135  --  138 138 135 136  K 4.9  --  4.0 3.4* 4.6 4.2  CL 102  --  99* 97* 98* 99*  CO2 26  --  '22 29 24 27  '$ GLUCOSE 95  --  95 99 84 93  BUN 23*  --  24* 41* 41* 41*  CREATININE 1.39* 1.30* 1.49* 1.87* 1.72* 1.63*  CALCIUM 9.0  --  9.6 9.3 9.5 9.7   Liver Function Tests:  Recent Labs Lab 08/04/16 1245 08/05/16 0556  AST 38 40  ALT 28 27  ALKPHOS 84 91  BILITOT 0.8 0.6  PROT 7.4 7.5  ALBUMIN 4.1 4.3   No results for input(s): LIPASE, AMYLASE in the last 168 hours. No results for input(s): AMMONIA in the last 168 hours. CBC:  Recent Labs Lab 08/04/16 1245 08/04/16 1700 08/05/16 0556  WBC 5.3 6.0 5.9  HGB 11.2* 11.9* 12.3  HCT 34.2* 37.4 38.4  MCV 83.6 82.9 83.1  PLT 329 352 284   Cardiac Enzymes: No results for input(s): CKTOTAL, CKMB, CKMBINDEX, TROPONINI in the last 168 hours. BNP: Invalid input(s): POCBNP CBG: No results for input(s): GLUCAP in the last 168 hours. D-Dimer No results for input(s): DDIMER in the last 72 hours. Hgb A1c No results for input(s): HGBA1C in the last 72 hours. Lipid Profile No results for input(s): CHOL, HDL, LDLCALC, TRIG, CHOLHDL, LDLDIRECT in the last 72  hours. Thyroid function studies No results for input(s): TSH, T4TOTAL, T3FREE, THYROIDAB in the last 72 hours.  Invalid input(s): FREET3 Anemia work up No results for input(s): VITAMINB12, FOLATE, FERRITIN, TIBC, IRON, RETICCTPCT in the last 72 hours. Urinalysis    Component Value Date/Time   COLORURINE AMBER (A) 07/21/2016 2104   APPEARANCEUR CLOUDY (A) 07/21/2016 2104   LABSPEC 1.023 07/21/2016 2104   PHURINE 5.0 07/21/2016 2104   GLUCOSEU NEGATIVE 07/21/2016 2104   HGBUR NEGATIVE 07/21/2016 2104   BILIRUBINUR NEGATIVE 07/21/2016 2104   [place] 07/21/2016 2104   PROTEINUR 100 (A) 07/21/2016 2104   NITRITE NEGATIVE 07/21/2016 2104   LEUKOCYTESUR MODERATE (A) 07/21/2016 2104   Sepsis Labs Invalid input(s): PROCALCITONIN,  WBC,  LACTICIDVEN Microbiology No results found for this or any previous visit (from the past 240 hour(s)).   Time coordinating discharge: Over 30 minutes  SIGNED:   Loretha Stapler, MD  [facility] 08/09/2016, 4:12 PM Pager [phone] If 7PM-7AM, please contact night-coverage [email or web address] Password [email or web address]

## 2016-08-09 NOTE — Telephone Encounter (Signed)
New message     TOC appt on  1.31.2018  Per Cecilie Kicks

## 2016-08-09 NOTE — Progress Notes (Signed)
Progress Note  Patient Name: Brittany [name] Date of Encounter: 08/09/2016  Primary Cardiologist: Dr. Tamala Julian  Subjective   No chest pain  Inpatient Medications    Scheduled Meds: . amiodarone  200 mg Oral Daily  . aspirin EC  325 mg Oral Daily  . cholecalciferol  2,000 Units Oral Daily  . donepezil  10 mg Oral QHS  . enoxaparin (LOVENOX) injection  30 mg Subcutaneous Q24H  . feeding supplement  1 Container Oral Daily  . feeding supplement (OSMOLITE 1.5 CAL)  237 mL Oral Q24H  . lamoTRIgine  100 mg Oral BID  . latanoprost  1 drop Both Eyes QHS  . levETIRAcetam  750 mg Oral BID  . lipase/protease/amylase  12,000 Units Oral TID WC  . memantine  10 mg Oral BID  . metoprolol tartrate  25 mg Oral BID  . QUEtiapine  12.5 mg Oral QPM  . sodium chloride flush  3 mL Intravenous Q12H  . sodium chloride flush  3 mL Intravenous Q12H   Continuous Infusions:  PRN Meds: sodium chloride, acetaminophen **OR** acetaminophen, hydrALAZINE, ondansetron **OR** ondansetron (ZOFRAN) IV, sodium chloride flush   Vital Signs    Vitals:   08/08/16 1437 08/08/16 2227 08/09/16 0428 08/09/16 0913  BP: 102/66 (!) 87/59 121/81 107/78  Pulse: 85 86 91 62  Resp: '20 20 20   '$ Temp: 97.5 F (36.4 C) 97.7 F (36.5 C) 97.7 F (36.5 C)   TempSrc: Oral Oral Oral   SpO2: 100% 100% 100%   Weight:   97 lb 10.6 oz (44.3 kg)   Height:        Intake/Output Summary (Last 24 hours) at 08/09/16 1203 Last data filed at 08/09/16 0731  Gross per 24 hour  Intake               36 ml  Output             2400 ml  Net            -2364 ml   Filed Weights   08/07/16 0555 08/08/16 0532 08/09/16 0428  Weight: 92 lb 14.4 oz (42.1 kg) 98 lb 8.7 oz (44.7 kg) 97 lb 10.6 oz (44.3 kg)    Telemetry    A flutter rate controlled - Personally Reviewed  ECG    None since the 19th.  - Personally Reviewed PHYSICAL EXAM Per Dr. Oval Linsey GEN: No acute distress.  Neck: No JVD Cardiac: RRR, no murmurs, rubs, or gallops.   Respiratory: Clear to auscultation bilaterally. GI: Soft, nontender, non-distended  MS: No edema; No deformity. Neuro:  AAOx3. Psych: Normal affect  Labs    Chemistry Recent Labs Lab 08/04/16 1245  08/05/16 0556 08/07/16 0536 08/08/16 0529 08/09/16 0554  NA 135  --  138 138 135 136  K 4.9  --  4.0 3.4* 4.6 4.2  CL 102  --  99* 97* 98* 99*  CO2 26  --  '22 29 24 27  '$ GLUCOSE 95  --  95 99 84 93  BUN 23*  --  24* 41* 41* 41*  CREATININE 1.39*  < > 1.49* 1.87* 1.72* 1.63*  CALCIUM 9.0  --  9.6 9.3 9.5 9.7  PROT 7.4  --  7.5  --   --   --   ALBUMIN 4.1  --  4.3  --   --   --   AST 38  --  40  --   --   --   ALT 28  --  27  --   --   --   ALKPHOS 84  --  91  --   --   --   BILITOT 0.8  --  0.6  --   --   --   GFRNONAA 41*  < > 38* 29* 32* 34*  GFRAA 47*  < > 44* 33* 37* 39*  ANIONGAP 7  --  17* '12 13 10  '$ < > = values in this interval not displayed.   Hematology Recent Labs Lab 08/04/16 1245 08/04/16 1700 08/05/16 0556  WBC 5.3 6.0 5.9  RBC 4.09 4.51 4.62  HGB 11.2* 11.9* 12.3  HCT 34.2* 37.4 38.4  MCV 83.6 82.9 83.1  MCH 27.4 26.4 26.6  MCHC 32.7 31.8 32.0  RDW 15.2 15.2 15.1  PLT 329 352 284    Cardiac EnzymesNo results for input(s): TROPONINI in the last 168 hours.  Recent Labs Lab 08/04/16 1252  TROPIPOC 0.01     BNP Recent Labs Lab 08/04/16 1245  BNP 1,367.2*     DDimer No results for input(s): DDIMER in the last 168 hours.   Radiology    No results found.  Cardiac Studies   ECHO:  07/21/16 ------------------------------------------------------------------- Study Conclusions  - Left ventricle: The cavity size was normal. Wall thickness was   increased in a pattern of severe LVH. Systolic function was   normal. The estimated ejection fraction was in the range of 50%   to 55%. The study is not technically sufficient to allow   evaluation of LV diastolic function. - Mitral valve: There was moderate regurgitation. - Left atrium: The atrium  was moderately dilated. - Right atrium: The atrium was moderately dilated. - Atrial septum: No defect or patent foramen ovale was identified. - Pulmonary arteries: PA peak pressure: 53 mm Hg (S).   Patient Profile     61 y.o. female w/ PMH ofdementia, seizures, chronic diastolic CHF, persistent atrial flutter, HTN, CVA, CKD III, COPD, chronic alcoholism, PAD, and recent admission from 1/5 - 07/26/2016 for cardiogenic shock who presented back on 08/04/2016 for worsening dyspnea, and admitted for acute on chronic diastolic CHF.   Assessment & Plan    . Acute on chronic diastolic heart failure - EF 50-55% by echo in 07/2016. BNP 1367 on admission with CXR showing progressive bilateral pulmonary infiltrates, most consistent with pulmonary edema. - started on IV Lasix '40mg'$  BID on admission and has been on HCTZ as well. Stopped HCTZ on 1/22  to avoid dual-diuretic therapy. Net output of -6.9L this admission with variable weights recorded. Weight 101 lbsat the time of last hospital discharge  Today 97. Creatinine with mild decrease today after increasing to pk of 1.87 now 1.63 today. Continue to hold Lasix ? Vs. resume PO Lasix   2. Persistent atrial flutter - Based on recent cardiology evaluation during last admission,plan is to treat with rate control strategy in the setting ofno anticoagulation.  - This patients CHA2DS2-VASc Score and unadjusted Ischemic Stroke Rate (% per year) is equal to 3.2 % stroke rate/year from a score of 3(CVA (2), Female). Previously on Xarelto but taken off this by her PCP. Continue ASA.  - HR remains in the 90's to 110's.  - continue Lopressor '25mg'$  BID and Amiodarone '200mg'$  daily. BP does not allow for titration of Lopressor at this time.  3. History of relative hypotension - current medication regimen includes Lopressor '25mg'$  BID. Stopped HCTZ --BP 107/78 to 121/81though last night to 87/59.   4. Dementia with  history of alcohol abuse - remains on Aricept. -  per admitting team.   5. Stage 3 CKD - baseline 1.1- 1.3. Creatinine 1.39 on admission. Peaked at 1.89 on 1/22 in the setting of being on Lasix and HCTZ  HCTZ discontinued and Lasix held. Now down to 1.63 - repeat BMET in AM.    Signed, Cecilie Kicks, NP  08/09/2016, 12:03 PM

## 2016-08-09 NOTE — Care Management Note (Signed)
Case Management Note  Patient Details  Name: Brittany [name] MRN: [id number] Date of Birth: Dec 01, 1955  Subjective/Objective: [facility]-spoke to rep Seth [facility], lives w/son. PT-recc supv.Provided w/custodial level Adult nurse as resource. Will fax d/c summary to [facility] once ready-[phone].                  Action/Plan:d/c plan home.   Expected Discharge Date:   (unknown)               Expected Discharge Plan:  Home/Self Care  In-House Referral:     Discharge planning Services  CM Consult  Post Acute Care Choice:  Resumption of Svcs/PTA Provider ([facility]-5dys/wk) Choice offered to:     DME Arranged:    DME Agency:     HH Arranged:    HH Agency:     Status of Service:  In process, will continue to follow  If discussed at Long Length of Stay Meetings, dates discussed:    Additional Comments:  Dessa Phi, RN 08/09/2016, 11:21 AM

## 2016-08-09 NOTE — Progress Notes (Signed)
Physical Therapy Treatment Patient Details Name: Brittany [name] MRN: [id number] DOB: 1956-03-10 Today's Date: 08/09/2016    History of Present Illness 61 yo female admitted with acute CHF. Hx of Sz, CVA, COPD, Afib, CHF, PAD, dementia  2* ETOH    PT Comments    Pt progressing well; incr tolerance to activity today, much more interactive and conversant today;   Follow Up Recommendations  Supervision - Intermittent;No PT follow up     Equipment Recommendations  None recommended by PT    Recommendations for Other Services       Precautions / Restrictions Precautions Precautions: Fall Precaution Comments: watch HR  Restrictions Weight Bearing Restrictions: No    Mobility  Bed Mobility Overal bed mobility: Needs Assistance Bed Mobility: Supine to Sit;Sit to Supine     Supine to sit: Supervision;Modified independent (Device/Increase time) Sit to supine: Supervision;Modified independent (Device/Increase time)   General bed mobility comments: for safety  Transfers Overall transfer level: Needs assistance Equipment used: None Transfers: Sit to/from Stand Sit to Stand: Supervision         General transfer comment: for safety  Ambulation/Gait Ambulation/Gait assistance: Min guard Ambulation Distance (Feet): 200 Feet Assistive device: Rolling walker (2 wheeled) Gait Pattern/deviations: Step-through pattern;Decreased stride length     General Gait Details: min/guard for safety, vaulting on RLE d/t not wearing shoe lift;  pt  able to maneuver in room in tighter spaces without AD and no LOB   Stairs            Wheelchair Mobility    Modified Rankin (Stroke Patients Only)       Balance Overall balance assessment: Needs assistance                           High level balance activites: Direction changes;Turns;Backward walking;Head turns High Level Balance Comments: maneuvering in room in tighter spaces without AD, no LOB, cues for safety     Cognition Arousal/Alertness: Awake/alert Behavior During Therapy: WFL for tasks assessed/performed Overall Cognitive Status: No family/caregiver present to determine baseline cognitive functioning                 General Comments: appropriate and interactive today    Exercises      General Comments        Pertinent Vitals/Pain Pain Assessment: No/denies pain    Home Living                      Prior Function            PT Goals (current goals can now be found in the care plan section) Acute Rehab PT Goals Patient Stated Goal: to go home PT Goal Formulation: With patient Time For Goal Achievement: 08/19/16 Potential to Achieve Goals: Good Progress towards PT goals: Progressing toward goals    Frequency    Min 3X/week      PT Plan Current plan remains appropriate    Co-evaluation             End of Session Equipment Utilized During Treatment: Gait belt Activity Tolerance: Patient tolerated treatment well Patient left: with call bell/phone within reach;in bed;with bed alarm set     Time: 2423-5361 PT Time Calculation (min) (ACUTE ONLY): 12 min  Charges:  $Gait Training: 8-22 mins                    G Codes:      Kenyon Ana  08/09/2016, 2:44 PM

## 2016-08-10 NOTE — Telephone Encounter (Signed)
LM TO CALL BACK ./CY 

## 2016-08-14 NOTE — Telephone Encounter (Signed)
Pt not at home per pt's son.

## 2016-08-15 NOTE — Progress Notes (Addendum)
CARDIOLOGY OFFICE NOTE  Date:  08/16/2016    Brittany [name] Date of Birth: [birth year] Medical Record #[id number]  PCP:  Pcp Not In System  Cardiologist:  Brittany [name]  Chief Complaint  Patient presents with  . Atrial Fibrillation  . Congestive Heart Failure    Post hospital visit - seen for Dr. Tamala [name]    History of Present Illness: Brittany [name] is a 61 y.o. female who presents today for a post hospital visit. Seen for Dr. Tamala [name].   She has a history of diastolic heart failure, atrial fib/flutter with RVR, and BP issues. She is no longer on anticoagulation in light of frequent falls. She has reported dementia. Other issues include prior CVA, CKD, COPD, seizures, pancreatitis, pulmonary HTN, PAD and chronic alcoholism.   Two admissions already this year - most recently with worsening dyspnea/acute diastolic HF - had not been compliant with diet - getting too much salt. Previously with an admission for shock and hypotension associated with diarrhea and uncontrolled AF. She was placed on Amiodarone due to poorly controlled rates and low BP.   Comes in today. Here alone. Can't really understand what she is saying. No one came with her but son came towards the end of today's visit. [facility]. Lives with her family and goes to [facility] during the week. She says she is here because of her "thumper". Breathing is better. No chest pain. No swelling. She likes doing word puzzles. She has no real concerns. Her son is managing her medicines. He admits she does like her salt but he is trying to restrict - actually brought some in her bag that he brought to my attention.    Past Medical History:  Diagnosis Date  . Atrial flutter ([place])   . COPD (chronic obstructive pulmonary disease) ([place])   . CVA (cerebral vascular accident) ([place])   . 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 Diastolic heart failure ([place])   . HTN (hypertension)   . PAD (peripheral artery disease) ([place])   . Pancreatitis     . Seizures ([place])     Past Surgical History:  Procedure Laterality Date  . CENTRAL LINE INSERTION  07/21/2016         Medications: Current Outpatient Prescriptions  Medication Sig Dispense Refill  . acetaminophen (TYLENOL) 500 MG tablet Take 1,000 mg by mouth 2 (two) times daily as needed (for various aches and pains).     Marland Kitchen amiodarone (PACERONE) 200 MG tablet Take 1 tablet (200 mg total) by mouth daily. Start taking 07/30/15 30 tablet 1  . aspirin EC 325 MG tablet Take 325 mg by mouth daily.    . Cholecalciferol (VITAMIN D3) 2000 units TABS Take 2,000 Units by mouth daily.    Marland Kitchen donepezil (ARICEPT) 10 MG tablet Take 10 mg by mouth at bedtime.    . furosemide (LASIX) 20 MG tablet Take 1 tablet (20 mg total) by mouth daily. 30 tablet 0  . lamoTRIgine (LAMICTAL) 100 MG tablet Take 100 mg by mouth 2 (two) times daily.    Marland Kitchen latanoprost (XALATAN) 0.005 % ophthalmic solution Place 1 drop into both eyes at bedtime.    . levETIRAcetam (KEPPRA) 750 MG tablet Take 750 mg by mouth 2 (two) times daily.    . memantine (NAMENDA) 10 MG tablet Take 10 mg by mouth 2 (two) times daily.    . metoprolol tartrate (LOPRESSOR) 25 MG tablet Take 1 tablet (25 mg total) by mouth 2 (two)  times daily. 60 tablet 0  . Nutritional Supplements (ENSURE CLEAR) LIQD Take 1 Can by mouth daily.    . Nutritional Supplements (FEEDING SUPPLEMENT, VITAL 1.5 CAL,) LIQD Take 237 mLs by mouth 2 (two) times daily.     . Pancrelipase, Lip-Prot-Amyl, (CREON) 24000-76000 units CPEP Take 1 capsule by mouth 3 (three) times daily with meals. FOR DIGESTION    . QUEtiapine (SEROQUEL) 25 MG tablet Take 12.5 mg by mouth every evening.     No current facility-administered medications for this visit.     Allergies: No Known Allergies  Social History: The patient  reports that she has quit smoking. She has never used smokeless tobacco. She reports that she drinks alcohol. She reports that she does not use drugs.   Family History: The  patient's family history includes Hypertension in her father.   Review of Systems: Please see the history of present illness.   Otherwise, the review of systems is positive for none.   All other systems are reviewed and negative.   Physical Exam: VS:  BP 100/70   Pulse 65   Ht 5' 3.5" (1.613 m)   Wt 97 lb 1.9 oz (44.1 kg)   SpO2 100%   BMI 16.93 kg/m  .  BMI Body mass index is 16.93 kg/m.  Wt Readings from Last 3 Encounters:  08/16/16 97 lb 1.9 oz (44.1 kg)  08/09/16 97 lb 10.6 oz (44.3 kg)  07/26/16 101 lb 6.4 oz (46 kg)    General: Chronically ill. Malnourished. She is quite thin and in no acute distress.   HEENT: Normal.  Neck: Supple, no JVD, carotid bruits, or masses noted.  Cardiac: Irregular rhythm. Rate is ok.  No edema.  Respiratory:  Lungs are clear to auscultation bilaterally with normal work of breathing.  GI: Soft and nontender.  MS: No deformity or atrophy. Gait and ROM intact. Little unsteady. She has a cane.  Skin: Warm and dry. Color is normal.  Neuro:  Strength and sensation are intact and no gross focal deficits noted.  Psych: Alert, appropriate and with normal affect.   LABORATORY DATA:  EKG:  EKG is ordered today. This shows atrial flutter with variable block with a controlled VR. Rate of 65.   Lab Results  Component Value Date   WBC 5.9 08/05/2016   HGB 12.3 08/05/2016   HCT 38.4 08/05/2016   PLT 284 08/05/2016   GLUCOSE 93 08/09/2016   ALT 27 08/05/2016   AST 40 08/05/2016   NA 136 08/09/2016   K 4.2 08/09/2016   CL 99 (L) 08/09/2016   CREATININE 1.63 (H) 08/09/2016   BUN 41 (H) 08/09/2016   CO2 27 08/09/2016   TSH 2.240 07/24/2016   INR 1.19 07/21/2016    BNP (last 3 results)  Recent Labs  07/21/16 1746 08/04/16 1245  BNP 2,207.7* 1,367.2*    ProBNP (last 3 results) No results for input(s): PROBNP in the last 8760 hours.   Other Studies Reviewed Today:   Assessment/Plan:  1. Acute on chronic diastolic heart failure -  improved clinically. Would leave on her current regimen. Salt restriction. Overall prognosis tenuous at best in my opinion. I would leave her on her current dose of diuretic. I think we will just have to accept her renal dysfunction. Compliance will be an issue.   2. Persistent A Flutter --CHADSVASc at least 6, but high risk for falls and bleed. She is no longer a candidate for anticoagulation. Her rate is controlled. She is  on amiodarone and metoprolol for rate control. EKG ok today. Given her multiple issues and overall poor prognosis - I would favor not sending her for PFTs.   3. CKD stage 3 - recent labs noted.   4. Dementia -  on aricept, namenda, seroquel   5. Seizure disorder  on lamictal, keppra   6. Past alcohol abuse  7. High risk medicine - will plan to recheck her labs on return.   Current medicines are reviewed with the patient today.  The patient does not have concerns regarding medicines other than what has been noted above.  The following changes have been made:  See above.  Labs/ tests ordered today include:   No orders of the defined types were placed in this encounter.    Disposition:   FU with in 2 months.   Patient is agreeable to this plan and will call if any problems develop in the interim.   SignedTruitt Merle, NP  08/16/2016 9:10 AM  [facility] [address] [place], [place]  [address] Phone: [phone] Fax: [phone]

## 2016-08-16 ENCOUNTER — Ambulatory Visit (INDEPENDENT_AMBULATORY_CARE_PROVIDER_SITE_OTHER): Payer: Medicare (Managed Care) | Admitting: Nurse Practitioner

## 2016-08-16 ENCOUNTER — Encounter: Payer: Self-pay | Admitting: Nurse Practitioner

## 2016-08-16 ENCOUNTER — Telehealth: Payer: Self-pay | Admitting: *Deleted

## 2016-08-16 VITALS — BP 100/70 | HR 65 | Ht 63.5 in | Wt 97.1 lb

## 2016-08-16 DIAGNOSIS — I4892 Unspecified atrial flutter: Secondary | ICD-10-CM

## 2016-08-16 DIAGNOSIS — F1010 Alcohol abuse, uncomplicated: Secondary | ICD-10-CM

## 2016-08-16 DIAGNOSIS — I480 Paroxysmal atrial fibrillation: Secondary | ICD-10-CM | POA: Diagnosis not present

## 2016-08-16 DIAGNOSIS — I5032 Chronic diastolic (congestive) heart failure: Secondary | ICD-10-CM

## 2016-08-16 NOTE — Telephone Encounter (Signed)
Faxing to [facility] @ [phone] pt's ov note. Telephone # is [phone].

## 2016-08-16 NOTE — Patient Instructions (Addendum)
We will be checking the following labs today - NONE   Medication Instructions:    Continue with your current medicines.     Testing/Procedures To Be Arranged:  N/A  Follow-Up:   See me in 2 months - will do labs then    Other Special Instructions:   Try to avoid salt as best you can.    If you need a refill on your cardiac medications before your next appointment, please call your pharmacy.   Call the [place] office at [phone] if you have any questions, problems or concerns.

## 2016-10-10 NOTE — Progress Notes (Signed)
CARDIOLOGY OFFICE NOTE  Date:  10/11/2016    Brittany [name] Date of Birth: Dec 01, 1955 Medical Record #[id number]  PCP:  Pcp Not In System  Cardiologist:  [place]   Chief Complaint  Patient presents with  . Congestive Heart Failure    3 month check - seen for Dr. Tamala Julian    History of Present Illness: Brittany [name] is a 61 y.o. female who presents today for a 3 month check. Seen for Dr. Tamala Julian.   She has a history of diastolic heart failure, atrial fib/flutter with RVR, and BP issues. She is no longer on anticoagulation in light of frequent falls. She has dementia - on several agents. Other issues include prior CVA, CKD, COPD, seizures, pancreatitis, pulmonary HTN, PAD and chronic alcoholism.   Two admissions already this year - most recently with worsening dyspnea/acute diastolic HF - had not been compliant with diet - getting too much salt. Previously with an admission for shock and hypotension associated with diarrhea and uncontrolled AF. She was placed on Amiodarone due to poorly controlled rates and low BP.   I saw her back towards the end of January - very sad situation overall. She is at [facility] during the day. Son manages her medicines.   Comes in today. Here with her son. He manages her medicines. Tries to limit her salt but he will find her with a bottle of salt or a cigarette sometimes. No chest pain. Breathing is ok. Weight up just a couple of pounds. [facility]. Taking her medicines. Seems to be holding her own.   Past Medical History:  Diagnosis Date  . Atrial flutter ([place])   . COPD (chronic obstructive pulmonary disease) ([place])   . CVA (cerebral vascular accident) ([place])   . 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 Diastolic heart failure ([place])   . HTN (hypertension)   . PAD (peripheral artery disease) ([place])   . Pancreatitis   . Seizures ([place])     Past Surgical History:  Procedure Laterality Date  . CENTRAL LINE INSERTION  07/21/2016           Medications: Current Outpatient Prescriptions  Medication Sig Dispense Refill  . acetaminophen (TYLENOL) 500 MG tablet Take 1,000 mg by mouth 2 (two) times daily as needed (for various aches and pains).     Marland Kitchen amiodarone (PACERONE) 200 MG tablet Take 1 tablet (200 mg total) by mouth daily. Start taking 07/30/15 30 tablet 1  . aspirin EC 325 MG tablet Take 325 mg by mouth daily.    . Cholecalciferol (VITAMIN D3) 2000 units TABS Take 2,000 Units by mouth daily.    Marland Kitchen donepezil (ARICEPT) 10 MG tablet Take 10 mg by mouth at bedtime.    . furosemide (LASIX) 20 MG tablet Take 1 tablet (20 mg total) by mouth daily. 30 tablet 0  . lamoTRIgine (LAMICTAL) 100 MG tablet Take 100 mg by mouth 2 (two) times daily.    Marland Kitchen latanoprost (XALATAN) 0.005 % ophthalmic solution Place 1 drop into both eyes at bedtime.    . levETIRAcetam (KEPPRA) 750 MG tablet Take 750 mg by mouth 2 (two) times daily.    . memantine (NAMENDA) 10 MG tablet Take 10 mg by mouth 2 (two) times daily.    . metoprolol tartrate (LOPRESSOR) 25 MG tablet Take 1 tablet (25 mg total) by mouth 2 (two) times daily. 60 tablet 0  . Nutritional Supplements (ENSURE CLEAR) LIQD Take 1 Can  by mouth daily.    . Nutritional Supplements (FEEDING SUPPLEMENT, VITAL 1.5 CAL,) LIQD Take 237 mLs by mouth 2 (two) times daily.     . Pancrelipase, Lip-Prot-Amyl, (CREON) 24000-76000 units CPEP Take 1 capsule by mouth 3 (three) times daily with meals. FOR DIGESTION    . QUEtiapine (SEROQUEL) 25 MG tablet Take 12.5 mg by mouth every evening.     No current facility-administered medications for this visit.     Allergies: No Known Allergies  Social History: The patient  reports that she has quit smoking. She has never used smokeless tobacco. She reports that she drinks alcohol. She reports that she does not use drugs.   Family History: The patient's family history includes Hypertension in her father.   Review of Systems: Please see the history of  present illness.   Otherwise, the review of systems is positive for none.   All other systems are reviewed and negative.   Physical Exam: VS:  BP 104/80   Pulse (!) 101   Ht 5' 3.5" (1.613 m)   Wt 101 lb 12.8 oz (46.2 kg)   SpO2 98%   BMI 17.75 kg/m  .  BMI Body mass index is 17.75 kg/m.  Wt Readings from Last 3 Encounters:  10/11/16 101 lb 12.8 oz (46.2 kg)  08/16/16 97 lb 1.9 oz (44.1 kg)  08/09/16 97 lb 10.6 oz (44.3 kg)    General:  Alert. Looks chronically ill. Looks older than her stated age. She is in no acute distress.  Quite thin.  HEENT: Normal.  Neck: Supple, no JVD, carotid bruits, or masses noted.  Cardiac: Irregular irregular rhythm. Rate about 90 by me.  No murmurs, rubs, or gallops. No edema.  Respiratory:  Lungs are clear to auscultation bilaterally with normal work of breathing.  GI: Soft and nontender.  MS: No deformity or atrophy. Gait and ROM intact. She is using a cane.   Skin: Warm and dry. Color is normal.  Neuro:  Strength and sensation are intact and no gross focal deficits noted.  Psych: Alert, appropriate and with normal affect.   LABORATORY DATA:  EKG:  EKG is not ordered today.  Lab Results  Component Value Date   WBC 5.9 08/05/2016   HGB 12.3 08/05/2016   HCT 38.4 08/05/2016   PLT 284 08/05/2016   GLUCOSE 93 08/09/2016   ALT 27 08/05/2016   AST 40 08/05/2016   NA 136 08/09/2016   K 4.2 08/09/2016   CL 99 (L) 08/09/2016   CREATININE 1.63 (H) 08/09/2016   BUN 41 (H) 08/09/2016   CO2 27 08/09/2016   TSH 2.240 07/24/2016   INR 1.19 07/21/2016    BNP (last 3 results)  Recent Labs  07/21/16 1746 08/04/16 1245  BNP 2,207.7* 1,367.2*    ProBNP (last 3 results) No results for input(s): PROBNP in the last 8760 hours.   Other Studies Reviewed Today:  Echo Study Conclusions from 07/2016  - Left ventricle: The cavity size was normal. Wall thickness was   increased in a pattern of severe LVH. Systolic function was   normal. The  estimated ejection fraction was in the range of 50%   to 55%. The study is not technically sufficient to allow   evaluation of LV diastolic function. - Mitral valve: There was moderate regurgitation. - Left atrium: The atrium was moderately dilated. - Right atrium: The atrium was moderately dilated. - Atrial septum: No defect or patent foramen ovale was identified. - Pulmonary arteries: PA  peak pressure: 53 mm Hg (S).   Assessment/Plan:  1. Chronic diastolic heart failure - looks ok clinically. Would leave on her current regimen. Salt restriction. Overall prognosis remains tenuous at best in my opinion. I would leave her on her current regimen. I think we will just have to accept her renal dysfunction. Compliance will be an issue but her son does a really good job trying to manage.   2. Persistent A Flutter --CHADSVASc at least 6, but high risk for falls and bleed. She is no longer a candidate for anticoagulation. Her rate is controlled. She is on both amiodarone and metoprolol for rate control. Labs today. Given her multiple issues and overall poor prognosis - I would favor not sending her for PFTs.   3. CKD stage 3 - rechecking lab today.    4. Dementia -  on aricept, namenda, seroquel   5. Seizure disorder  on lamictal, keppra   6. Past alcohol abuse  7. High risk medicine - labs today.   Current medicines are reviewed with the patient today.  The patient does not have concerns regarding medicines other than what has been noted above.  The following changes have been made:  See above.  Labs/ tests ordered today include:    Orders Placed This Encounter  Procedures  . Basic metabolic panel  . Pro b natriuretic peptide (BNP)  . CBC  . TSH  . Hepatic function panel     Disposition:   FU with me in 4 months. She seems to be holding her own for now. Would continue with a conservative approach.   Patient is agreeable to this plan and will call if any problems develop  in the interim.   SignedTruitt Merle, NP  10/11/2016 10:22 AM  [facility] [address] [place], [place]  [address] Phone: [phone] Fax: [phone]

## 2016-10-11 ENCOUNTER — Ambulatory Visit (INDEPENDENT_AMBULATORY_CARE_PROVIDER_SITE_OTHER): Payer: Medicare (Managed Care) | Admitting: Nurse Practitioner

## 2016-10-11 ENCOUNTER — Encounter (INDEPENDENT_AMBULATORY_CARE_PROVIDER_SITE_OTHER): Payer: Self-pay

## 2016-10-11 ENCOUNTER — Encounter: Payer: Self-pay | Admitting: Nurse Practitioner

## 2016-10-11 VITALS — BP 104/80 | HR 101 | Ht 63.5 in | Wt 101.8 lb

## 2016-10-11 DIAGNOSIS — Z79899 Other long term (current) drug therapy: Secondary | ICD-10-CM | POA: Diagnosis not present

## 2016-10-11 DIAGNOSIS — I4892 Unspecified atrial flutter: Secondary | ICD-10-CM | POA: Diagnosis not present

## 2016-10-11 DIAGNOSIS — I5032 Chronic diastolic (congestive) heart failure: Secondary | ICD-10-CM

## 2016-10-11 NOTE — Patient Instructions (Addendum)
We will be checking the following labs today - BMET, BNP, CBC, HPF and TSh   Medication Instructions:    Continue with your current medicines.     Testing/Procedures To Be Arranged:  N/A  Follow-Up:   See me in 4 months    Other Special Instructions:   Try not to smoke  Keep limiting your salt    If you need a refill on your cardiac medications before your next appointment, please call your pharmacy.   Call the [place] office at [phone] if you have any questions, problems or concerns.

## 2016-10-12 ENCOUNTER — Other Ambulatory Visit: Payer: Self-pay

## 2016-10-12 DIAGNOSIS — I5033 Acute on chronic diastolic (congestive) heart failure: Secondary | ICD-10-CM

## 2016-10-12 LAB — HEPATIC FUNCTION PANEL
ALT: 16 IU/L (ref 0–32)
AST: 30 IU/L (ref 0–40)
Albumin: 4.3 g/dL (ref 3.5–5.5)
Alkaline Phosphatase: 80 IU/L (ref 39–117)
Bilirubin Total: 0.3 mg/dL (ref 0.0–1.2)
Bilirubin, Direct: 0.17 mg/dL (ref 0.00–0.40)
Total Protein: 7.3 g/dL (ref 6.0–8.5)

## 2016-10-12 LAB — BASIC METABOLIC PANEL
BUN/Creatinine Ratio: 24 — ABNORMAL HIGH (ref 9–23)
BUN: 36 mg/dL — ABNORMAL HIGH (ref 6–24)
CO2: 20 mmol/L (ref 18–29)
Calcium: 9.6 mg/dL (ref 8.7–10.2)
Chloride: 99 mmol/L (ref 96–106)
Creatinine, Ser: 1.52 mg/dL — ABNORMAL HIGH (ref 0.57–1.00)
GFR calc Af Amer: 43 mL/min/{1.73_m2} — ABNORMAL LOW (ref >59)
GFR calc non Af Amer: 38 mL/min/{1.73_m2} — ABNORMAL LOW (ref >59)
Glucose: 94 mg/dL (ref 65–99)
Potassium: 4.6 mmol/L (ref 3.5–5.2)
Sodium: 141 mmol/L (ref 134–144)

## 2016-10-12 LAB — CBC
Hematocrit: 37.3 % (ref 34.0–46.6)
Hemoglobin: 11.7 g/dL (ref 11.1–15.9)
MCH: 25.7 pg — ABNORMAL LOW (ref 26.6–33.0)
MCHC: 31.4 g/dL — ABNORMAL LOW (ref 31.5–35.7)
MCV: 82 fL (ref 79–97)
Platelets: 224 10*3/uL (ref 150–379)
RBC: 4.55 x10E6/uL (ref 3.77–5.28)
RDW: 15.9 % — ABNORMAL HIGH (ref 12.3–15.4)
WBC: 3.9 10*3/uL (ref 3.4–10.8)

## 2016-10-12 LAB — TSH: TSH: 3.99 u[IU]/mL (ref 0.450–4.500)

## 2016-10-12 LAB — PRO B NATRIURETIC PEPTIDE: NT-Pro BNP: 8862 pg/mL — ABNORMAL HIGH (ref 0–287)

## 2016-10-12 MED ORDER — FUROSEMIDE 40 MG PO TABS: ORAL_TABLET | ORAL | 6 refills | Status: DC

## 2016-10-19 ENCOUNTER — Other Ambulatory Visit: Payer: Medicare (Managed Care)

## 2016-10-20 ENCOUNTER — Telehealth: Payer: Self-pay | Admitting: Nurse Practitioner

## 2016-10-20 NOTE — Telephone Encounter (Signed)
Left message for [name] to call back

## 2016-10-20 NOTE — Telephone Encounter (Signed)
Spoke with Brittany [name] who called for Dr. Bradd Burner at [facility] to verify accurate lasix dose. I reviewed the information from patient's lab work:  Notes recorded by Burtis Junes, NP on 10/12/2016 at 8:50 AM EDT Ok to report. Labs are stable - but her fluid level is pretty high - would take Lasix 60 mg for 3 days and then 40 mg each day - probably will need new prescription. BMET and BNP in one week. Her son manages her medicines.  Otherwise, would continue on current regimen.  I reviewed this information with [name] who verbalized understanding and agreement. She states she will fax lab results to [phone], ATTN: [name]. She thanked me for my help.

## 2016-10-20 NOTE — Telephone Encounter (Signed)
New Message  Pt c/o medication issue:  1. Name of Medication: lasix  2. How are you currently taking this medication (dosage and times per day)? '40mg'$    3. Are you having a reaction (difficulty breathing--STAT)? No   4. What is your medication issue? Brittany [name]; RN at Dr[facility] offices called requesting to speak with Rn about getting the correct dosage of medication she would need to take. Please call back to discuss

## 2016-10-21 NOTE — Telephone Encounter (Signed)
Can we be on the lookout for this.

## 2016-11-03 ENCOUNTER — Telehealth: Payer: Self-pay | Admitting: *Deleted

## 2016-11-03 DIAGNOSIS — I5033 Acute on chronic diastolic (congestive) heart failure: Secondary | ICD-10-CM

## 2016-11-03 DIAGNOSIS — N183 Chronic kidney disease, stage 3 unspecified: Secondary | ICD-10-CM

## 2016-11-03 MED ORDER — FUROSEMIDE 40 MG PO TABS: 40.0000 mg | ORAL_TABLET | ORAL | Status: DC

## 2016-11-03 NOTE — Telephone Encounter (Signed)
DPR ok to lmom. I lmom due to lab results need to hold lasix x 1 day (Saturday 11/04/16), Starting Sunday 4/22 alternate 40 mg every other day with 20 mg every other day, with bmet to be done in 1 week. Please call the office on Monday 4/23 to schedule lab and go over lab results by phone. If any questions over the weekend call the office # [phone] and s/w the On-call provider.  I lmom x 2 on her pt's machine in hopes the pt will understand the what the recommendations are per Richardson Dopp, PAC.

## 2016-11-06 ENCOUNTER — Telehealth: Payer: Self-pay | Admitting: *Deleted

## 2016-11-06 NOTE — Telephone Encounter (Signed)
Lmtcb x 2 to go over lab results and change with lasix.

## 2016-11-07 ENCOUNTER — Telehealth: Payer: Self-pay | Admitting: *Deleted

## 2016-11-07 NOTE — Telephone Encounter (Signed)
I s/w [name], RN at [facility] in regards to pt lab results and recommendations per [name], NP. [name], RN verbalized understanding to plan of care for the pt. I will fax over results to [facility] attention Dr. Bradd Burner fax # [phone]. I asked if BMET results could please faxed to [name], NP fax # [phone].

## 2016-11-20 ENCOUNTER — Telehealth: Payer: Self-pay | Admitting: *Deleted

## 2016-11-20 NOTE — Telephone Encounter (Signed)
Lmom for [name] at [facility] lab results reviewed by [name], NP labs stable, continue on current regimen. If any questions please call [phone] and ask for [name], NP [place]. . I will fax a copy of results to [facility] as well.

## 2017-01-16 ENCOUNTER — Encounter: Payer: Self-pay | Admitting: *Deleted

## 2017-01-21 ENCOUNTER — Inpatient Hospital Stay (HOSPITAL_COMMUNITY)
Admission: EM | Admit: 2017-01-21 | Discharge: 2017-01-26 | DRG: 291 | Disposition: A | Discharge status: Home / Self Care | Payer: Medicare (Managed Care) | Attending: Internal Medicine | Admitting: Internal Medicine

## 2017-01-21 ENCOUNTER — Emergency Department (HOSPITAL_COMMUNITY): Payer: Medicare (Managed Care)

## 2017-01-21 ENCOUNTER — Encounter (HOSPITAL_COMMUNITY): Payer: Self-pay | Admitting: Emergency Medicine

## 2017-01-21 DIAGNOSIS — R634 Abnormal weight loss: Secondary | ICD-10-CM

## 2017-01-21 DIAGNOSIS — I130 Hypertensive heart and chronic kidney disease with heart failure and stage 1 through stage 4 chronic kidney disease, or unspecified chronic kidney disease: Principal | ICD-10-CM | POA: Diagnosis present

## 2017-01-21 DIAGNOSIS — Z9181 History of falling: Secondary | ICD-10-CM

## 2017-01-21 DIAGNOSIS — R569 Unspecified convulsions: Secondary | ICD-10-CM

## 2017-01-21 DIAGNOSIS — J810 Acute pulmonary edema: Secondary | ICD-10-CM

## 2017-01-21 DIAGNOSIS — J449 Chronic obstructive pulmonary disease, unspecified: Secondary | ICD-10-CM | POA: Diagnosis present

## 2017-01-21 DIAGNOSIS — I248 Other forms of acute ischemic heart disease: Secondary | ICD-10-CM | POA: Diagnosis present

## 2017-01-21 DIAGNOSIS — Z87891 Personal history of nicotine dependence: Secondary | ICD-10-CM

## 2017-01-21 DIAGNOSIS — J9601 Acute respiratory failure with hypoxia: Secondary | ICD-10-CM | POA: Diagnosis present

## 2017-01-21 DIAGNOSIS — I4892 Unspecified atrial flutter: Secondary | ICD-10-CM | POA: Diagnosis present

## 2017-01-21 DIAGNOSIS — R7989 Other specified abnormal findings of blood chemistry: Secondary | ICD-10-CM

## 2017-01-21 DIAGNOSIS — I5033 Acute on chronic diastolic (congestive) heart failure: Secondary | ICD-10-CM | POA: Diagnosis present

## 2017-01-21 DIAGNOSIS — R160 Hepatomegaly, not elsewhere classified: Secondary | ICD-10-CM | POA: Diagnosis present

## 2017-01-21 DIAGNOSIS — N179 Acute kidney failure, unspecified: Secondary | ICD-10-CM | POA: Diagnosis present

## 2017-01-21 DIAGNOSIS — Z8673 Personal history of transient ischemic attack (TIA), and cerebral infarction without residual deficits: Secondary | ICD-10-CM

## 2017-01-21 DIAGNOSIS — G40909 Epilepsy, unspecified, not intractable, without status epilepticus: Secondary | ICD-10-CM | POA: Diagnosis present

## 2017-01-21 DIAGNOSIS — Z681 Body mass index (BMI) 19 or less, adult: Secondary | ICD-10-CM

## 2017-01-21 DIAGNOSIS — N183 Chronic kidney disease, stage 3 unspecified: Secondary | ICD-10-CM | POA: Diagnosis present

## 2017-01-21 DIAGNOSIS — F1027 Alcohol dependence with alcohol-induced persisting dementia: Secondary | ICD-10-CM | POA: Diagnosis present

## 2017-01-21 DIAGNOSIS — Z8679 Personal history of other diseases of the circulatory system: Secondary | ICD-10-CM

## 2017-01-21 DIAGNOSIS — I481 Persistent atrial fibrillation: Secondary | ICD-10-CM | POA: Diagnosis present

## 2017-01-21 DIAGNOSIS — I4891 Unspecified atrial fibrillation: Secondary | ICD-10-CM

## 2017-01-21 DIAGNOSIS — E43 Unspecified severe protein-calorie malnutrition: Secondary | ICD-10-CM | POA: Diagnosis present

## 2017-01-21 DIAGNOSIS — I739 Peripheral vascular disease, unspecified: Secondary | ICD-10-CM | POA: Diagnosis present

## 2017-01-21 DIAGNOSIS — R10819 Abdominal tenderness, unspecified site: Secondary | ICD-10-CM

## 2017-01-21 DIAGNOSIS — I509 Heart failure, unspecified: Secondary | ICD-10-CM

## 2017-01-21 DIAGNOSIS — Z7982 Long term (current) use of aspirin: Secondary | ICD-10-CM

## 2017-01-21 DIAGNOSIS — K861 Other chronic pancreatitis: Secondary | ICD-10-CM | POA: Diagnosis present

## 2017-01-21 DIAGNOSIS — K5900 Constipation, unspecified: Secondary | ICD-10-CM | POA: Diagnosis present

## 2017-01-21 DIAGNOSIS — F0390 Unspecified dementia without behavioral disturbance: Secondary | ICD-10-CM | POA: Diagnosis present

## 2017-01-21 DIAGNOSIS — Z79899 Other long term (current) drug therapy: Secondary | ICD-10-CM

## 2017-01-21 NOTE — ED Provider Notes (Signed)
[facility] DEPT Provider Note   CSN: [id number] Arrival date & time: 01/21/17  2326  By signing my name below, I, Theresia Bough, attest that this documentation has been prepared under the direction and in the presence of Varney Biles, MD. Electronically Signed: Theresia Bough, ED Scribe. 01/21/17. 11:55 PM.  History   Chief Complaint Chief Complaint  Patient presents with  . Shortness of Breath   The history is provided by the patient. No language interpreter was used.   HPI Comments: Brittany [name] is a 61 y.o. female with a PMHx of CHF and COPD, who presents to the Emergency Department via EMS complaining of sudden onset, moderate SOB onset 45 minutes ago. Pt states it is hard to take a deep breath. Pt states she has been taking all of her medications. No oxygen at home. No tobacco use currently. No hx of PE/DVT. Pt denies CP, cough, increased palpitations or any other complaints at this time.  Past Medical History:  Diagnosis Date  . Atrial flutter ([place])   . COPD (chronic obstructive pulmonary disease) ([place])   . CVA (cerebral vascular accident) ([place])   . 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 Diastolic heart failure ([place])   . HTN (hypertension)   . PAD (peripheral artery disease) ([place])   . Pancreatitis   . Seizures [facility])     Patient Active Problem List   Diagnosis Date Noted  . CHF (congestive heart failure) ([place]) 01/22/2017  . Acute renal failure with acute tubular necrosis superimposed on stage 3 chronic kidney disease ([place])   . Dementia 08/05/2016  . CKD (chronic kidney disease) stage 3, GFR 30-59 ml/min 08/05/2016  . Seizures ([place]) 08/05/2016  . Acute on chronic diastolic heart failure ([place]) 08/04/2016  . Malnutrition of moderate degree 07/26/2016  . Atrial flutter ([place])   . AKI (acute kidney injury) ([place])   . Cardiogenic shock ([place]) 07/21/2016  . Syncope and collapse   . Encounter for central line care   . Hypotension     Past Surgical History:    Procedure Laterality Date  . CENTRAL LINE INSERTION  07/21/2016      . SHOULDER SURGERY Left     OB History    No data available       Home Medications    Prior to Admission medications   Medication Sig Start Date End Date Taking? Authorizing Provider  acetaminophen (TYLENOL) 500 MG tablet Take 1,000 mg by mouth 2 (two) times daily as needed (for various aches and pains).    Yes [provider]  amiodarone (PACERONE) 200 MG tablet Take 1 tablet (200 mg total) by mouth daily. Start taking 07/30/15 07/26/16  Yes Gherghe, Vella Redhead, MD  aspirin EC 325 MG tablet Take 325 mg by mouth daily.   Yes [provider]  Cholecalciferol (VITAMIN D3) 2000 units TABS Take 2,000 Units by mouth daily.   Yes [provider]  donepezil (ARICEPT) 10 MG tablet Take 10 mg by mouth at bedtime.   Yes [provider]  furosemide (LASIX) 40 MG tablet Take 1 tablet (40 mg total) by mouth as directed. Alternate 40 mg every other day wit 20 mg every other day Patient taking differently: Take 20-30 mg by mouth daily. 20 mg on even days and 30 mg odd days. 11/03/16 02/01/17 Yes Burtis Junes, NP  lamoTRIgine (LAMICTAL) 100 MG tablet Take 100 mg by mouth 2 (two) times daily.   Yes [provider]  latanoprost (XALATAN) 0.005 %  ophthalmic solution Place 1 drop into both eyes at bedtime.   Yes [provider]  levETIRAcetam (KEPPRA) 750 MG tablet Take 750 mg by mouth 2 (two) times daily.   Yes [provider]  memantine (NAMENDA) 10 MG tablet Take 10 mg by mouth 2 (two) times daily.   Yes [provider]  metoprolol tartrate (LOPRESSOR) 25 MG tablet Take 1 tablet (25 mg total) by mouth 2 (two) times daily. 08/09/16  Yes Eber Jones, MD  Pancrelipase, Lip-Prot-Amyl, (CREON) 24000-76000 units CPEP Take 1 capsule by mouth 3 (three) times daily with meals. FOR DIGESTION   Yes [provider]  QUEtiapine (SEROQUEL) 25 MG tablet Take 12.5  mg by mouth every evening.   Yes [provider]    Family History Family History  Problem Relation Age of Onset  . Hypertension Father   . Cancer Mother   . Cancer Child   . Cerebrovascular Accident Child   . Gout Child     Social History Social History  Substance Use Topics  . Smoking status: Former Research scientist (life sciences)  . Smokeless tobacco: Never Used  . Alcohol use Yes     Allergies   Patient has no known allergies.   Review of Systems Review of Systems  Respiratory: Positive for shortness of breath. Negative for cough.   Cardiovascular: Negative for chest pain and palpitations.  Gastrointestinal: Positive for diarrhea.  All other systems reviewed and are negative.    Physical Exam Updated Vital Signs BP (!) 157/132 (BP Location: Right Arm)   Pulse (!) 121   Temp 98.1 F (36.7 C) (Rectal)   Resp (!) 22   Ht 5' 3.5" (1.613 m)   Wt 45.4 kg (100 lb)   SpO2 96%   BMI 17.44 kg/m   Physical Exam  Constitutional: She is oriented to person, place, and time. She appears well-developed and well-nourished.  HENT:  Head: Normocephalic.  Eyes: EOM are normal.  Neck: Normal range of motion.  Cardiovascular:  Faint pulse. Tachycardia irregular.  Pulmonary/Chest: Effort normal. She has rales.  Bilateral rales and rhonchial breath sounds. No distended neck veins. No pitting edema. Tachypneic  Abdominal: She exhibits no distension.  Musculoskeletal: Normal range of motion.  Neurological: She is alert and oriented to person, place, and time.  Psychiatric: She has a normal mood and affect.  Nursing note and vitals reviewed.    ED Treatments / Results  DIAGNOSTIC STUDIES: Oxygen Saturation is 80% on [place], low by my interpretation.   COORDINATION OF CARE: 11:54 PM-Discussed next steps with pt including a chest XR. Pt verbalized understanding and is agreeable with the plan.   Labs (all labs ordered are listed, but only abnormal results are displayed) Labs Reviewed   BASIC METABOLIC PANEL - Abnormal; Notable for the following:       Result Value   Glucose, Bld 152 (*)    BUN 33 (*)    Creatinine, Ser 1.82 (*)    GFR calc non Af Amer 29 (*)    GFR calc Af Amer 34 (*)    All other components within normal limits  CBC - Abnormal; Notable for the following:    MCH 24.9 (*)    RDW 18.4 (*)    All other components within normal limits  BRAIN NATRIURETIC PEPTIDE  I-STAT TROPOININ, ED    EKG  EKG Interpretation  Date/Time:  Sunday January 21 2017 23:28:37 EDT Ventricular Rate:  123 PR Interval:    QRS Duration: 105 QT  Interval:  302 QTC Calculation: 432 R Axis:   -90 Text Interpretation:  Ectopic atrial tachycardia, unifocal ST depresson laterally noted on EKG 08/04/2016 Baseline wander in lead(s) III aVL aVF Confirmed by Tanna Furry [phone]) on 01/21/2017 11:33:40 PM       Radiology Dg Chest Portable 1 View  Result Date: 01/22/2017 CLINICAL DATA:  61 year old female with shortness of breath. EXAM: PORTABLE CHEST 1 VIEW COMPARISON:  Chest radiograph dated 08/04/2016 FINDINGS: Bilateral large patchy airspace opacities primarily involving the mid to lower lung fields noted which may represent pneumonia, or pulmonary edema. If there is history of malignancy, and infiltrative process or lymphangitic spread of tumor is not excluded. There is a small right pleural effusion. No pneumothorax. The cardiac silhouette is enlarged. No acute osseous pathology. IMPRESSION: Cardiomegaly with large bilateral pulmonary infiltrates, likely pulmonary edema. Pneumonia is not excluded. Clinical correlation is recommended. There is a small right pleural effusion. Electronically Signed   By: Anner Crete M.D.   On: 01/22/2017 00:23    Procedures Procedures (including critical care time)  CRITICAL CARE Performed by: Varney Biles   Total critical care time: 38 minutes  Critical care time was exclusive of separately billable procedures and treating other  patients.  Critical care was necessary to treat or prevent imminent or life-threatening deterioration.  Critical care was time spent personally by me on the following activities: development of treatment plan with patient and/or surrogate as well as nursing, discussions with consultants, evaluation of patient's response to treatment, examination of patient, obtaining history from patient or surrogate, ordering and performing treatments and interventions, ordering and review of laboratory studies, ordering and review of radiographic studies, pulse oximetry and re-evaluation of patient's condition.   Medications Ordered in ED Medications  diltiazem (CARDIZEM) 1 mg/mL load via infusion 10 mg (not administered)    And  diltiazem (CARDIZEM) 100 mg in dextrose 5 % 100 mL (1 mg/mL) infusion (not administered)     Initial Impression / Assessment and Plan / ED Course  I have reviewed the triage vital signs and the nursing notes.  Pertinent labs & imaging results that were available during my care of the patient were reviewed by me and considered in my medical decision making (see chart for details).  Clinical Course as of Jan 23 52  Mon Jan 22, 2017  0050 Pt on bipap. More comfortable now. HR is 117. Will admit. DG Chest Portable 1 View [AN]    Clinical Course User Index [AN] Varney Biles, MD    Pt has a history of diastolic heart failure, atrial fib/flutter with RVR, CVA, CKD, COPD, seizures, pancreatitis, pulmonary HTN, PAD and chronic alcoholism. She presents with sudden onset dyspnea and is noted to be tachypneic, tachycardic and hypertensive. Coarse breath sounds with rales diffusely. Clinically, appears to be pulmonary edema. We will start her on bipap. Pt is in RVR but it is not severe - and that might be contributing, or is the result of her worsening CHF - either way, we will order dilt gtt to restore the atrial kick. Given she is in CHF exacerbation we will avoid BB. Clinically,  no suspicion for PNA given pt was doing well until about hour ago and had a sudden decline and there is no fevers.   Final Clinical Impressions(s) / ED Diagnoses   Final diagnoses:  Flash pulmonary edema (HCC)  Atrial fibrillation with RVR (HCC)    New Prescriptions New Prescriptions   No medications on file   I personally  performed the services described in this documentation, which was scribed in my presence. The recorded information has been reviewed and is accurate.     Varney Biles, MD 01/22/17 [phone]

## 2017-01-21 NOTE — ED Triage Notes (Signed)
Per EMS, pt from home. Pt started having some sob that started 1.5 hours ago. Pt had no sx earlier. Pt has dementia baseline. EMS heard rales and pt was in 80s SpO2 on room air. Pt placed on NRB and 99%. EMS VS HR 117 and BP 160/110. Pt has been spitting up thick, foamy, frothy sputum. Upon arrival pt an episode of large malodorous diarrhea.

## 2017-01-22 ENCOUNTER — Inpatient Hospital Stay (HOSPITAL_COMMUNITY): Payer: Medicare (Managed Care)

## 2017-01-22 ENCOUNTER — Encounter (HOSPITAL_COMMUNITY): Payer: Self-pay | Admitting: Internal Medicine

## 2017-01-22 DIAGNOSIS — I509 Heart failure, unspecified: Secondary | ICD-10-CM

## 2017-01-22 DIAGNOSIS — Z79899 Other long term (current) drug therapy: Secondary | ICD-10-CM | POA: Diagnosis not present

## 2017-01-22 DIAGNOSIS — K861 Other chronic pancreatitis: Secondary | ICD-10-CM | POA: Diagnosis present

## 2017-01-22 DIAGNOSIS — I4891 Unspecified atrial fibrillation: Secondary | ICD-10-CM | POA: Diagnosis not present

## 2017-01-22 DIAGNOSIS — I340 Nonrheumatic mitral (valve) insufficiency: Secondary | ICD-10-CM

## 2017-01-22 DIAGNOSIS — I5030 Unspecified diastolic (congestive) heart failure: Secondary | ICD-10-CM

## 2017-01-22 DIAGNOSIS — J449 Chronic obstructive pulmonary disease, unspecified: Secondary | ICD-10-CM | POA: Diagnosis present

## 2017-01-22 DIAGNOSIS — J810 Acute pulmonary edema: Secondary | ICD-10-CM

## 2017-01-22 DIAGNOSIS — I481 Persistent atrial fibrillation: Secondary | ICD-10-CM | POA: Diagnosis present

## 2017-01-22 DIAGNOSIS — I248 Other forms of acute ischemic heart disease: Secondary | ICD-10-CM | POA: Diagnosis present

## 2017-01-22 DIAGNOSIS — I5031 Acute diastolic (congestive) heart failure: Secondary | ICD-10-CM | POA: Diagnosis not present

## 2017-01-22 DIAGNOSIS — Z7982 Long term (current) use of aspirin: Secondary | ICD-10-CM | POA: Diagnosis not present

## 2017-01-22 DIAGNOSIS — Z8679 Personal history of other diseases of the circulatory system: Secondary | ICD-10-CM | POA: Diagnosis not present

## 2017-01-22 DIAGNOSIS — R634 Abnormal weight loss: Secondary | ICD-10-CM | POA: Diagnosis not present

## 2017-01-22 DIAGNOSIS — K5900 Constipation, unspecified: Secondary | ICD-10-CM | POA: Diagnosis present

## 2017-01-22 DIAGNOSIS — E43 Unspecified severe protein-calorie malnutrition: Secondary | ICD-10-CM | POA: Diagnosis present

## 2017-01-22 DIAGNOSIS — F1027 Alcohol dependence with alcohol-induced persisting dementia: Secondary | ICD-10-CM | POA: Diagnosis present

## 2017-01-22 DIAGNOSIS — I5033 Acute on chronic diastolic (congestive) heart failure: Secondary | ICD-10-CM | POA: Diagnosis present

## 2017-01-22 DIAGNOSIS — N179 Acute kidney failure, unspecified: Secondary | ICD-10-CM | POA: Diagnosis present

## 2017-01-22 DIAGNOSIS — I130 Hypertensive heart and chronic kidney disease with heart failure and stage 1 through stage 4 chronic kidney disease, or unspecified chronic kidney disease: Secondary | ICD-10-CM | POA: Diagnosis present

## 2017-01-22 DIAGNOSIS — Z681 Body mass index (BMI) 19 or less, adult: Secondary | ICD-10-CM | POA: Diagnosis not present

## 2017-01-22 DIAGNOSIS — R569 Unspecified convulsions: Secondary | ICD-10-CM | POA: Diagnosis not present

## 2017-01-22 DIAGNOSIS — F0150 Vascular dementia without behavioral disturbance: Secondary | ICD-10-CM

## 2017-01-22 DIAGNOSIS — Z8673 Personal history of transient ischemic attack (TIA), and cerebral infarction without residual deficits: Secondary | ICD-10-CM | POA: Diagnosis not present

## 2017-01-22 DIAGNOSIS — Z9181 History of falling: Secondary | ICD-10-CM | POA: Diagnosis not present

## 2017-01-22 DIAGNOSIS — N183 Chronic kidney disease, stage 3 (moderate): Secondary | ICD-10-CM | POA: Diagnosis present

## 2017-01-22 DIAGNOSIS — R160 Hepatomegaly, not elsewhere classified: Secondary | ICD-10-CM | POA: Diagnosis present

## 2017-01-22 DIAGNOSIS — J9601 Acute respiratory failure with hypoxia: Secondary | ICD-10-CM | POA: Diagnosis present

## 2017-01-22 DIAGNOSIS — G40909 Epilepsy, unspecified, not intractable, without status epilepticus: Secondary | ICD-10-CM | POA: Diagnosis present

## 2017-01-22 DIAGNOSIS — Z87891 Personal history of nicotine dependence: Secondary | ICD-10-CM | POA: Diagnosis not present

## 2017-01-22 DIAGNOSIS — I4892 Unspecified atrial flutter: Secondary | ICD-10-CM | POA: Diagnosis present

## 2017-01-22 DIAGNOSIS — I739 Peripheral vascular disease, unspecified: Secondary | ICD-10-CM | POA: Diagnosis present

## 2017-01-22 LAB — ECHOCARDIOGRAM COMPLETE
Height: 63.5 in
Weight: 1600 oz

## 2017-01-22 LAB — BASIC METABOLIC PANEL
Anion gap: 14 (ref 5–15)
BUN: 33 mg/dL — AB (ref 6–20)
CHLORIDE: 102 mmol/L (ref 101–111)
CO2: 24 mmol/L (ref 22–32)
CREATININE: 1.82 mg/dL — AB (ref 0.44–1.00)
Calcium: 9.3 mg/dL (ref 8.9–10.3)
GFR calc Af Amer: 34 mL/min — ABNORMAL LOW (ref >60)
GFR, EST NON AFRICAN AMERICAN: 29 mL/min — AB (ref >60)
GLUCOSE: 152 mg/dL — AB (ref 65–99)
POTASSIUM: 3.7 mmol/L (ref 3.5–5.1)
Sodium: 140 mmol/L (ref 135–145)

## 2017-01-22 LAB — CBC
HCT: 42.3 % (ref 36.0–46.0)
HEMATOCRIT: 41 % (ref 36.0–46.0)
HEMOGLOBIN: 13.1 g/dL (ref 12.0–15.0)
Hemoglobin: 12.5 g/dL (ref 12.0–15.0)
MCH: 24.9 pg — ABNORMAL LOW (ref 26.0–34.0)
MCH: 25 pg — ABNORMAL LOW (ref 26.0–34.0)
MCHC: 30.5 g/dL (ref 30.0–36.0)
MCHC: 31 g/dL (ref 30.0–36.0)
MCV: 80.9 fL (ref 78.0–100.0)
MCV: 81.7 fL (ref 78.0–100.0)
PLATELETS: 164 10*3/uL (ref 150–400)
PLATELETS: 184 10*3/uL (ref 150–400)
RBC: 5.02 MIL/uL (ref 3.87–5.11)
RBC: 5.23 MIL/uL — ABNORMAL HIGH (ref 3.87–5.11)
RDW: 18.1 % — ABNORMAL HIGH (ref 11.5–15.5)
RDW: 18.4 % — AB (ref 11.5–15.5)
WBC: 6.6 10*3/uL (ref 4.0–10.5)
WBC: 7.9 10*3/uL (ref 4.0–10.5)

## 2017-01-22 LAB — TROPONIN I
TROPONIN I: 0.04 ng/mL — AB (ref <0.03)
Troponin I: 0.04 ng/mL (ref <0.03)
Troponin I: 0.04 ng/mL (ref <0.03)

## 2017-01-22 LAB — HIV ANTIBODY (ROUTINE TESTING W REFLEX): HIV Screen 4th Generation wRfx: NONREACTIVE

## 2017-01-22 LAB — I-STAT TROPONIN, ED: Troponin i, poc: 0.07 ng/mL (ref 0.00–0.08)

## 2017-01-22 LAB — BRAIN NATRIURETIC PEPTIDE
B NATRIURETIC PEPTIDE 5: 1507.3 pg/mL — AB (ref 0.0–100.0)
B Natriuretic Peptide: 1761.8 pg/mL — ABNORMAL HIGH (ref 0.0–100.0)

## 2017-01-22 LAB — SODIUM, URINE, RANDOM: SODIUM UR: 79 mmol/L

## 2017-01-22 LAB — TSH: TSH: 1.947 u[IU]/mL (ref 0.350–4.500)

## 2017-01-22 LAB — D-DIMER, QUANTITATIVE: D-Dimer, Quant: 1.41 ug/mL-FEU — ABNORMAL HIGH (ref 0.00–0.50)

## 2017-01-22 MED ORDER — LEVETIRACETAM 750 MG PO TABS
750.0000 mg | ORAL_TABLET | Freq: Two times a day (BID) | ORAL | Status: DC
  Administered 2017-01-22 – 2017-01-26 (×9): 750 mg via ORAL
  Filled 2017-01-22 (×10): qty 1

## 2017-01-22 MED ORDER — DONEPEZIL HCL 10 MG PO TABS
10.0000 mg | ORAL_TABLET | Freq: Every day | ORAL | Status: DC
  Administered 2017-01-22 – 2017-01-25 (×4): 10 mg via ORAL
  Filled 2017-01-22 (×5): qty 1

## 2017-01-22 MED ORDER — QUETIAPINE 12.5 MG HALF TABLET
12.5000 mg | ORAL_TABLET | Freq: Every evening | ORAL | Status: DC
  Administered 2017-01-22 – 2017-01-25 (×4): 12.5 mg via ORAL
  Filled 2017-01-22 (×5): qty 1

## 2017-01-22 MED ORDER — ALBUTEROL SULFATE (2.5 MG/3ML) 0.083% IN NEBU: 2.5000 mg | INHALATION_SOLUTION | Freq: Four times a day (QID) | RESPIRATORY_TRACT | Status: DC | PRN

## 2017-01-22 MED ORDER — AMIODARONE HCL 200 MG PO TABS
200.0000 mg | ORAL_TABLET | Freq: Every day | ORAL | Status: DC
  Administered 2017-01-22 – 2017-01-26 (×5): 200 mg via ORAL
  Filled 2017-01-22 (×5): qty 1

## 2017-01-22 MED ORDER — METOPROLOL TARTRATE 25 MG PO TABS
25.0000 mg | ORAL_TABLET | Freq: Once | ORAL | Status: AC
  Administered 2017-01-22: 25 mg via ORAL
  Filled 2017-01-22: qty 1

## 2017-01-22 MED ORDER — METOPROLOL TARTRATE 25 MG PO TABS
25.0000 mg | ORAL_TABLET | Freq: Two times a day (BID) | ORAL | Status: DC
  Administered 2017-01-22: 25 mg via ORAL
  Filled 2017-01-22: qty 1

## 2017-01-22 MED ORDER — LAMOTRIGINE 25 MG PO TABS
100.0000 mg | ORAL_TABLET | Freq: Two times a day (BID) | ORAL | Status: DC
  Administered 2017-01-22 – 2017-01-26 (×9): 100 mg via ORAL
  Filled 2017-01-22 (×5): qty 4
  Filled 2017-01-22: qty 1
  Filled 2017-01-22: qty 4
  Filled 2017-01-22: qty 1
  Filled 2017-01-22 (×3): qty 4

## 2017-01-22 MED ORDER — SODIUM CHLORIDE 0.9% FLUSH: 3.0000 mL | INTRAVENOUS | Status: DC | PRN

## 2017-01-22 MED ORDER — DILTIAZEM LOAD VIA INFUSION
10.0000 mg | Freq: Once | INTRAVENOUS | Status: AC
  Administered 2017-01-22: 10 mg via INTRAVENOUS
  Filled 2017-01-22: qty 10

## 2017-01-22 MED ORDER — FUROSEMIDE 10 MG/ML IJ SOLN
40.0000 mg | Freq: Once | INTRAMUSCULAR | Status: AC
  Administered 2017-01-22: 40 mg via INTRAVENOUS
  Filled 2017-01-22: qty 4

## 2017-01-22 MED ORDER — SODIUM CHLORIDE 0.9% FLUSH
3.0000 mL | Freq: Two times a day (BID) | INTRAVENOUS | Status: DC
  Administered 2017-01-22 – 2017-01-25 (×6): 3 mL via INTRAVENOUS

## 2017-01-22 MED ORDER — DILTIAZEM HCL 100 MG IV SOLR
5.0000 mg/h | INTRAVENOUS | Status: DC
  Administered 2017-01-22: 5 mg/h via INTRAVENOUS
  Filled 2017-01-22: qty 100

## 2017-01-22 MED ORDER — SODIUM CHLORIDE 0.9 % IV SOLN: 250.0000 mL | INTRAVENOUS | Status: DC | PRN

## 2017-01-22 MED ORDER — HEPARIN SODIUM (PORCINE) 5000 UNIT/ML IJ SOLN
5000.0000 [IU] | Freq: Three times a day (TID) | INTRAMUSCULAR | Status: DC
  Administered 2017-01-23: 5000 [IU] via SUBCUTANEOUS
  Filled 2017-01-22 (×5): qty 1

## 2017-01-22 MED ORDER — FUROSEMIDE 10 MG/ML IJ SOLN
20.0000 mg | Freq: Every day | INTRAMUSCULAR | Status: DC
  Administered 2017-01-22: 20 mg via INTRAVENOUS
  Filled 2017-01-22 (×2): qty 2

## 2017-01-22 MED ORDER — PANCRELIPASE (LIP-PROT-AMYL) 12000-38000 UNITS PO CPEP
24000.0000 [IU] | ORAL_CAPSULE | Freq: Three times a day (TID) | ORAL | Status: DC
  Administered 2017-01-22 – 2017-01-26 (×11): 24000 [IU] via ORAL
  Filled 2017-01-22 (×11): qty 2

## 2017-01-22 MED ORDER — TECHNETIUM TO 99M ALBUMIN AGGREGATED
4.2000 | Freq: Once | INTRAVENOUS | Status: AC | PRN
  Administered 2017-01-22: 4.2 via INTRAVENOUS

## 2017-01-22 MED ORDER — ENSURE ENLIVE PO LIQD
237.0000 mL | Freq: Two times a day (BID) | ORAL | Status: DC
  Administered 2017-01-22 – 2017-01-26 (×8): 237 mL via ORAL

## 2017-01-22 MED ORDER — METOPROLOL TARTRATE 50 MG PO TABS
50.0000 mg | ORAL_TABLET | Freq: Two times a day (BID) | ORAL | Status: DC
  Administered 2017-01-22: 50 mg via ORAL
  Filled 2017-01-22 (×2): qty 1

## 2017-01-22 MED ORDER — ACETAMINOPHEN 650 MG RE SUPP: 650.0000 mg | Freq: Four times a day (QID) | RECTAL | Status: DC | PRN

## 2017-01-22 MED ORDER — MEMANTINE HCL 10 MG PO TABS
5.0000 mg | ORAL_TABLET | Freq: Two times a day (BID) | ORAL | Status: DC
  Administered 2017-01-22 – 2017-01-26 (×9): 5 mg via ORAL
  Filled 2017-01-22 (×10): qty 1

## 2017-01-22 MED ORDER — ACETAMINOPHEN 325 MG PO TABS: 650.0000 mg | ORAL_TABLET | Freq: Four times a day (QID) | ORAL | Status: DC | PRN

## 2017-01-22 MED ORDER — DILTIAZEM HCL 30 MG PO TABS
30.0000 mg | ORAL_TABLET | Freq: Once | ORAL | Status: AC
  Administered 2017-01-22: 30 mg via ORAL
  Filled 2017-01-22 (×2): qty 1

## 2017-01-22 MED ORDER — LATANOPROST 0.005 % OP SOLN
1.0000 [drp] | Freq: Every day | OPHTHALMIC | Status: DC
  Administered 2017-01-22 – 2017-01-25 (×4): 1 [drp] via OPHTHALMIC
  Filled 2017-01-22: qty 2.5

## 2017-01-22 MED ORDER — TECHNETIUM TC 99M DIETHYLENETRIAME-PENTAACETIC ACID
32.6000 | Freq: Once | INTRAVENOUS | Status: AC | PRN
  Administered 2017-01-22: 32.6 via RESPIRATORY_TRACT

## 2017-01-22 MED ORDER — ASPIRIN EC 325 MG PO TBEC
325.0000 mg | DELAYED_RELEASE_TABLET | Freq: Every day | ORAL | Status: DC
Start: 2017-01-22 — End: 2017-01-26
  Administered 2017-01-22 – 2017-01-26 (×5): 325 mg via ORAL
  Filled 2017-01-22 (×5): qty 1

## 2017-01-22 MED ORDER — SODIUM CHLORIDE 0.9% FLUSH
3.0000 mL | Freq: Two times a day (BID) | INTRAVENOUS | Status: DC
  Administered 2017-01-22 – 2017-01-26 (×3): 3 mL via INTRAVENOUS

## 2017-01-22 MED ORDER — VITAMIN D 1000 UNITS PO TABS
2000.0000 [IU] | ORAL_TABLET | Freq: Every day | ORAL | Status: DC
  Administered 2017-01-22 – 2017-01-26 (×5): 2000 [IU] via ORAL
  Filled 2017-01-22 (×5): qty 2

## 2017-01-22 NOTE — Progress Notes (Signed)
Patient is in nuclear medicine.

## 2017-01-22 NOTE — ED Notes (Signed)
U/S at bedside, pt moved onto inpt bed for comfort, pt tolerating bipap well

## 2017-01-22 NOTE — ED Notes (Signed)
Dr. Kim at bedside   

## 2017-01-22 NOTE — Progress Notes (Signed)
Pt seen and examined at bedside, please see earlier admission note by Dr. Maudie Mercury. Pt admitted for evaluation of dyspnea, in ED noted to be in atrial fib/flutter with RVR. Cardiology consulted, assistance appreciated. Plan to increase the dose of Metoprolol to 50 mg PO BID. Monitor on tele. CBC and BMP in AM.  Faye Ramsay, MD  [facility] Pager [phone]  If 7PM-7AM, please contact night-coverage [email or web address] Password [email or web address]

## 2017-01-22 NOTE — ED Notes (Signed)
U/s to be done at bedside d/t bipap

## 2017-01-22 NOTE — ED Notes (Signed)
Dr. Kathrynn Humble aware Cardizem has been stopped d/t HR upper 60's lower 70's.  Pt tolerating Bipap very well, appears much more comfortable.

## 2017-01-22 NOTE — ED Notes (Signed)
Troponin 0.04 reported to Dr. Kathrynn Humble, repeat EKG captured

## 2017-01-22 NOTE — Progress Notes (Signed)
   01/22/17 1627  Clinical Encounter Type  Visited With Family;Patient not available  Visit Type Initial;Social support (Advanced Directive Education)   Chaplain educated daughter about paperwork for her mother.  Patient is gone for testing and not available.  Daughter appreciative of the education and will talk to her mother about it and once they are ready Chaplain told them to please call us for completion.  Chaplain would like to thank everyone for their care for this patient.

## 2017-01-22 NOTE — ED Notes (Signed)
Pt resting comfortably on stretcher, external catheter functioning properly, pt tolerating bipap well.

## 2017-01-22 NOTE — H&P (Addendum)
TR[facility] H&P   Patient Demographics:    Brittany [name], is a 61 y.o. female  MRN: [id number]   DOB - Dec 07, 1955  Admit Date - 01/21/2017  Outpatient Primary MD for the patient is [facility], [place] Not In  Referring MD/NP/PA: Tanna Furry  Outpatient Specialists:     Patient coming from: home  Chief Complaint  Patient presents with  . Shortness of Breath      HPI:    Brittany [name]  is a 61 y.o. female, w hx of CHF (diastolic), Aflutter (chadsvasc>=6), not a candidate for anticoagulation due to recurrent falls, CKD stage3, Dementia, Seizure do, apparently presents with acute dyspnea starting 2 hours prior to coming to ED.  Pt denies fever, chills, cough, cp, palp, n/v, diarrhea, brbpr.    In ED, pt found to be in Afib with RVR, and with mild acute on CRF.  Trop I 0.07,  Bun/Creat 33/1.83. BNP 1,761.8, Wbc 6.6   CXR bilateral infiltrates.   Pt was placed on cardizem gtt and also on Bipap.  Pt will be admitted for CHF and Afib with RVR.     Review of systems:    In addition to the HPI above,  No Fever-chills, No Headache, No changes with Vision or hearing, No problems swallowing food or Liquids, No Chest pain, or  Cough. No Abdominal pain, No Nausea or Vommitting, Bowel movements are regular, No Blood in stool or Urine, No dysuria, No new skin rashes or bruises, No new joints pains-aches,  No new weakness, tingling, numbness in any extremity, No recent weight gain or loss, No polyuria, polydypsia or polyphagia, No significant Mental Stressors.  A full 10 point Review of Systems was done, except as stated above, all other Review of Systems were negative.   With Past History of the following :    Past Medical History:  Diagnosis Date  . Atrial flutter ([place])   . COPD (chronic obstructive pulmonary disease) ([place])   . CVA (cerebral vascular accident) ([place])   . Dementia due to  alcohol ([place])   . Diastolic heart failure ([place])   . HTN (hypertension)   . PAD (peripheral artery disease) ([place])   . Pancreatitis   . Seizures ([place])       Past Surgical History:  Procedure Laterality Date  . CENTRAL LINE INSERTION  07/21/2016      . SHOULDER SURGERY Left       Social History:     Social History  Substance Use Topics  . Smoking status: Former Research scientist (life sciences)  . Smokeless tobacco: Never Used  . Alcohol use Yes     Lives - at home, Attends [employer] - walks by self   Family History :     Family History  Problem Relation Age of Onset  . Hypertension Father   . Cancer Mother   . Cancer Child   .  Cerebrovascular Accident Child   . Gout Child       Home Medications:   Prior to Admission medications   Medication Sig Start Date End Date Taking? Authorizing Provider  acetaminophen (TYLENOL) 500 MG tablet Take 1,000 mg by mouth 2 (two) times daily as needed (for various aches and pains).    Yes [provider]  amiodarone (PACERONE) 200 MG tablet Take 1 tablet (200 mg total) by mouth daily. Start taking 07/30/15 07/26/16  Yes Gherghe, Vella Redhead, MD  aspirin EC 325 MG tablet Take 325 mg by mouth daily.   Yes [provider]  Cholecalciferol (VITAMIN D3) 2000 units TABS Take 2,000 Units by mouth daily.   Yes [provider]  donepezil (ARICEPT) 10 MG tablet Take 10 mg by mouth at bedtime.   Yes [provider]  furosemide (LASIX) 40 MG tablet Take 1 tablet (40 mg total) by mouth as directed. Alternate 40 mg every other day wit 20 mg every other day Patient taking differently: Take 20-30 mg by mouth daily. 20 mg on even days and 30 mg odd days. 11/03/16 02/01/17 Yes Burtis Junes, NP  lamoTRIgine (LAMICTAL) 100 MG tablet Take 100 mg by mouth 2 (two) times daily.   Yes [provider]  latanoprost (XALATAN) 0.005 % ophthalmic solution Place 1 drop into both eyes at bedtime.   Yes [provider]    levETIRAcetam (KEPPRA) 750 MG tablet Take 750 mg by mouth 2 (two) times daily.   Yes [provider]  memantine (NAMENDA) 10 MG tablet Take 10 mg by mouth 2 (two) times daily.   Yes [provider]  metoprolol tartrate (LOPRESSOR) 25 MG tablet Take 1 tablet (25 mg total) by mouth 2 (two) times daily. 08/09/16  Yes Eber Jones, MD  Pancrelipase, Lip-Prot-Amyl, (CREON) 24000-76000 units CPEP Take 1 capsule by mouth 3 (three) times daily with meals. FOR DIGESTION   Yes [provider]  QUEtiapine (SEROQUEL) 25 MG tablet Take 12.5 mg by mouth every evening.   Yes [provider]     Allergies:    No Known Allergies   Physical Exam:   Vitals  Blood pressure (!) 157/132, pulse (!) 121, temperature 98.1 F (36.7 C), temperature source Rectal, resp. rate (!) 22, height 5' 3.5" (1.613 m), weight 45.4 kg (100 lb), SpO2 96 %.   1. General  lying in bed in NAD,    2. Normal affect and insight, Not Suicidal or Homicidal, Awake Alert, Oriented X 3.  3. No F.N deficits, ALL C.Nerves Intact, Strength 5/5 all 4 extremities, Sensation intact all 4 extremities, Plantars down going.  4. Ears and Eyes appear Normal, Conjunctivae clear, PERRLA. Moist Oral Mucosa.  5. Supple Neck, + JVD, No cervical lymphadenopathy appriciated, No Carotid Bruits.  6. Symmetrical Chest wall movement, Good air movement bilaterally, bilbasilar crackles. No wheezing  7. Irr, Irr s1, s2, , No Parasternal Heave.  8. Positive Bowel Sounds, Abdomen Soft, No tenderness, No organomegaly appriciated,No rebound -guarding or rigidity.  9.  No Cyanosis, Normal Skin Turgor, No Skin Rash or Bruise.  10. Good muscle tone,  joints appear normal , no effusions, Normal ROM.  11. No Palpable Lymph Nodes in Neck or Axillae     Data Review:    CBC  Recent Labs Lab 01/22/17 0004  WBC 6.6  HGB 12.5  HCT 41.0  PLT 184  MCV 81.7  MCH 24.9*  MCHC 30.5  RDW 18.4*    ------------------------------------------------------------------------------------------------------------------  Chemistries   Recent Labs Lab 01/22/17 0004  NA 140  K 3.7  CL 102  CO2 24  GLUCOSE 152*  BUN 33*  CREATININE 1.82*  CALCIUM 9.3   ------------------------------------------------------------------------------------------------------------------ estimated creatinine clearance is 23.9 mL/min (A) (by C-G formula based on SCr of 1.82 mg/dL (H)). ------------------------------------------------------------------------------------------------------------------ No results for input(s): TSH, T4TOTAL, T3FREE, THYROIDAB in the last 72 hours.  Invalid input(s): FREET3  Coagulation profile No results for input(s): INR, PROTIME in the last 168 hours. ------------------------------------------------------------------------------------------------------------------- No results for input(s): DDIMER in the last 72 hours. -------------------------------------------------------------------------------------------------------------------  Cardiac Enzymes No results for input(s): CKMB, TROPONINI, MYOGLOBIN in the last 168 hours.  Invalid input(s): CK ------------------------------------------------------------------------------------------------------------------    Component Value Date/Time   BNP 1,761.8 (H) 01/22/2017 0004     ---------------------------------------------------------------------------------------------------------------  Urinalysis    Component Value Date/Time   COLORURINE AMBER (A) 07/21/2016 2104   APPEARANCEUR CLOUDY (A) 07/21/2016 2104   LABSPEC 1.023 07/21/2016 2104   PHURINE 5.0 07/21/2016 2104   GLUCOSEU NEGATIVE 07/21/2016 2104   HGBUR NEGATIVE 07/21/2016 2104   BILIRUBINUR NEGATIVE 07/21/2016 2104   [place] NEGATIVE 07/21/2016 2104   PROTEINUR 100 (A) 07/21/2016 2104   NITRITE NEGATIVE 07/21/2016 2104   LEUKOCYTESUR MODERATE (A)  07/21/2016 2104    ----------------------------------------------------------------------------------------------------------------   Imaging Results:    Dg Chest Portable 1 View  Result Date: 01/22/2017 CLINICAL DATA:  61 year old female with shortness of breath. EXAM: PORTABLE CHEST 1 VIEW COMPARISON:  Chest radiograph dated 08/04/2016 FINDINGS: Bilateral large patchy airspace opacities primarily involving the mid to lower lung fields noted which may represent pneumonia, or pulmonary edema. If there is history of malignancy, and infiltrative process or lymphangitic spread of tumor is not excluded. There is a small right pleural effusion. No pneumothorax. The cardiac silhouette is enlarged. No acute osseous pathology. IMPRESSION: Cardiomegaly with large bilateral pulmonary infiltrates, likely pulmonary edema. Pneumonia is not excluded. Clinical correlation is recommended. There is a small right pleural effusion. Electronically Signed   By: Anner Crete M.D.   On: 01/22/2017 00:23    Afib with rvr at 120   Assessment & Plan:    Active Problems:   CHF (congestive heart failure) (HCC)   Acute renal failure (ARF) (HCC)   Atrial fibrillation with RVR (HCC)    Afib with RVR (chadsvasc >=6) Tele Trop I q6h x3 D dimer, if positive then VQ scan Tsh Cardiac echo Cont amiodarone Cardizem gtt No anticoagulation due to hx of recurrent falls  CHF (diastolic) Trop I X5T x3 Lasix 40mg  iv x1 Lasix 20mg  iv qday Cont Bipap Repeat CXR in am  Acute on CRF Check renal ultrasound Check ua, urine sodium, urine creatinine , urine eosinophils.  Check cmp in am  Seizure do Cont keppra  Dementia Cont current medications.      DVT Prophylaxis Heparin -  - SCDs   AM Labs Ordered, also please review Full Orders  Family Communication: Admission, patients condition and plan of care including tests being ordered have been discussed with the patient who indicate understanding and agree  with the plan and Code Status.  Code Status FULL CODE  Likely DC to  home  Condition Critical   Consults called:   Admission status: inpatient  Time spent in minutes : 45 minutes of critical care   Jani Gravel M.D on 01/22/2017 at 1:01 AM  Between 7am to 7pm - Pager - [phone]. After 7pm go to [email or web address] - password [facility]  [facility] - Office  [phone]

## 2017-01-22 NOTE — Consult Note (Signed)
Cardiology Consultation:   Patient ID: Brittany [name]; [id number]; 08/08/1955   Admit date: 01/21/2017 Date of Consult: 01/22/2017  Primary Care Provider: System, Pcp Not In Primary Cardiologist: Dr. Tamala Julian & [name], NP   Patient Profile:   Brittany [name] is a 61 y.o. female with a hx of Chronic diastolic heart failure, persistent atrial fibrillation/flutter, no longer on anticoagulation due to frequent fall, dementia, CVA, EKG, COPD, seizure, hypertension, PAD, CKD stage III, chronic alcoholism and pancreatitis who is being seen today for the evaluation of A. fib RVR and CHF at the request of Dr. Doyle Askew.   Admitted twice this year for acute dyspnea in setting of diastolic heart failure. She is noncompliant with diet. Eating a lot of salt.  She was doing ok when seen by [name], NP 10/11/16.   History of Present Illness:   Brittany [name] presented last night with acute onset dyspnea. She has fallen twice last month. Workup revealed A. fib RVR and acute CHF. Chest x-ray shows pulmonary edema with? Pneumonia.Marland Kitchen BNP 1700. She was placed on Cardizem gtt. and BiPAP. Given IV Lasix. Net I & O -2.1 L. Her symptoms somewhat improved. Elevated d-dimer. Pending VQ scan. She denies chest pain, palpitation, LE edema, melena or blood in her stool or urine.   Echo this admission showed apical hypokinesis with overall low normal LV systolic function; mild LVH; mild MR; severe biatrial enlargement; mild TR with mild to moderate elevation in pulmonary pressure. (EF is unchanged from 07/21/16).  EKG shows atrial flutter with ST depression. No significant changes from prior EKG except elevated rate - personally reviewed.   Past Medical History:  Diagnosis Date  . Atrial flutter ([place])   . COPD (chronic obstructive pulmonary disease) ([place])   . CVA (cerebral vascular accident) ([place])   . 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 Diastolic heart failure ([place])   . HTN (hypertension)   . PAD (peripheral artery  disease) ([place])   . Pancreatitis   . Seizures ([place])     Past Surgical History:  Procedure Laterality Date  . CENTRAL LINE INSERTION  07/21/2016      . SHOULDER SURGERY Left      Inpatient Medications: Scheduled Meds: . amiodarone  200 mg Oral Daily  . aspirin EC  325 mg Oral Daily  . cholecalciferol  2,000 Units Oral Daily  . donepezil  10 mg Oral QHS  . furosemide  20 mg Intravenous Daily  . heparin  5,000 Units Subcutaneous Q8H  . lamoTRIgine  100 mg Oral BID  . latanoprost  1 drop Both Eyes QHS  . levETIRAcetam  750 mg Oral BID  . lipase/protease/amylase  24,000 Units Oral TID WC  . memantine  5 mg Oral BID  . metoprolol tartrate  25 mg Oral BID  . QUEtiapine  12.5 mg Oral QPM  . sodium chloride flush  3 mL Intravenous Q12H  . sodium chloride flush  3 mL Intravenous Q12H   Continuous Infusions: . sodium chloride    . diltiazem (CARDIZEM) infusion Stopped (01/22/17 0158)   PRN Meds: sodium chloride, acetaminophen **OR** acetaminophen, albuterol, sodium chloride flush  Allergies:   No Known Allergies  Social History:   Social History   Social History  . Marital status: Widowed    Spouse name: N/A  . Number of children: N/A  . Years of education: N/A   Occupational History  . Not on file.   Social History Main Topics  . Smoking status: Former Research scientist (life sciences)  .  Smokeless tobacco: Never Used  . Alcohol use Yes  . Drug use: No  . Sexual activity: Not on file   Other Topics Concern  . Not on file   Social History Narrative  . No narrative on file    Family History:   The patient's family history includes Cancer in her child and mother; Cerebrovascular Accident in her child; Gout in her child; Hypertension in her father.  ROS:  Please see the history of present illness.  ROS All other ROS reviewed and negative.     Physical Exam/Data:   Vitals:   01/22/17 1100 01/22/17 1131 01/22/17 1200 01/22/17 1300  BP: 109/78 109/83 106/87 (!) 114/94  Pulse: 96 (!) 58  (!) 43 85  Resp: (!) 23 (!) 26 19 20   Temp:      TempSrc:      SpO2: 100% 98% 98% 99%  Weight:      Height:        Intake/Output Summary (Last 24 hours) at 01/22/17 1356 Last data filed at 01/22/17 1336  Gross per 24 hour  Intake                0 ml  Output             2100 ml  Net            -2100 ml   Filed Weights   01/21/17 2354  Weight: 100 lb (45.4 kg)   Body mass index is 17.44 kg/m.  General:  Thin frail female in no acute distress HEENT: normal Lymph: no adenopathy Neck: + JVD Endocrine:  No thryomegaly Vascular: No carotid bruits; FA pulses 2+ bilaterally without bruits  Cardiac:  normal S1, S2; irregular tachycardia no murmur  Lungs:  Bibasilar rales Abd: soft, nontender, no hepatomegaly  Ext: no edema Musculoskeletal:  No deformities, BUE and BLE strength normal and equal Skin: warm and dry  Neuro:  CNs 2-12 intact, no focal abnormalities noted Psych:  Normal affect    Telemetry:  Telemetry was personally reviewed and demonstrates:  Unable to review S patient was then transition from ER to floor. Telemetry was on hold during my evaluation.  Relevant CV Studies: Echo 01/22/17 Study Conclusions  - Left ventricle: The cavity size was normal. Wall thickness was   increased in a pattern of mild LVH. Systolic function was normal.   The estimated ejection fraction was in the range of 50% to 55%.   There is hypokinesis of the apical myocardium. The study is not   technically sufficient to allow evaluation of LV diastolic   function. - Mitral valve: Calcified annulus. There was mild regurgitation. - Left atrium: The atrium was severely dilated. - Right atrium: The atrium was severely dilated. - Pulmonary arteries: Systolic pressure was mildly to moderately   increased. PA peak pressure: 47 mm Hg (S).  Impressions:  - Apical hypokinesis with overall low normal LV systolic function;   mild LVH; mild MR; severe biatrial enlargement; mild TR with mild   to  moderate elevation in pulmonary pressure.   Laboratory Data:  Chemistry Recent Labs Lab 01/22/17 0004 01/22/17 0335  NA 140 138  K 3.7 5.6*  CL 102 102  CO2 24 24  GLUCOSE 152* 95  BUN 33* 33*  CREATININE 1.82* 1.70*  CALCIUM 9.3 9.4  GFRNONAA 29* 32*  GFRAA 34* 37*  ANIONGAP 14 12     Recent Labs Lab 01/22/17 0335  PROT 7.1  ALBUMIN 4.0  AST  63*  ALT 27  ALKPHOS 91  BILITOT 0.8   Hematology Recent Labs Lab 01-29-2017 0004 2017-01-29 0335  WBC 6.6 7.9  RBC 5.02 5.23*  HGB 12.5 13.1  HCT 41.0 42.3  MCV 81.7 80.9  MCH 24.9* 25.0*  MCHC 30.5 31.0  RDW 18.4* 18.1*  PLT 184 164   Cardiac Enzymes Recent Labs Lab 2017-01-29 0338 January 29, 2017 0939  TROPONINI 0.04* 0.04*    Recent Labs Lab 29-Jan-2017 0008  TROPIPOC 0.07    BNP Recent Labs Lab 2017/01/29 0004 01-29-17 0335  BNP 1,761.8* 1,507.3*    DDimer  Recent Labs Lab 29-Jan-2017 0338  DDIMER 1.41*    Radiology/Studies:  US Renal  Result Date: 01/29/17 CLINICAL DATA:  Acute renal failure EXAM: RENAL / URINARY TRACT ULTRASOUND COMPLETE COMPARISON:  None. FINDINGS: Right Kidney: Length: 10.4 cm. Increased cortical echogenicity. No hydronephrosis. Left Kidney: Length: 9.2 cm. Increased cortical echogenicity. No hydronephrosis. Small cyst in the upper pole measuring 0.7 x 0.5 x 0.9 cm. Bladder: Appears normal for degree of bladder distention. IMPRESSION: 1. Increased cortical echogenicity consistent with medical renal disease. No hydronephrosis. 2. Small cyst in the upper pole of the left kidney Electronically Signed   By: Donavan Foil M.D.   On: 01-29-17 03:06   Dg Chest Portable 1 View  Result Date: 01-29-17 CLINICAL DATA:  61 year old female with shortness of breath. EXAM: PORTABLE CHEST 1 VIEW COMPARISON:  Chest radiograph dated 08/04/2016 FINDINGS: Bilateral large patchy airspace opacities primarily involving the mid to lower lung fields noted which may represent pneumonia, or pulmonary edema. If there  is history of malignancy, and infiltrative process or lymphangitic spread of tumor is not excluded. There is a small right pleural effusion. No pneumothorax. The cardiac silhouette is enlarged. No acute osseous pathology. IMPRESSION: Cardiomegaly with large bilateral pulmonary infiltrates, likely pulmonary edema. Pneumonia is not excluded. Clinical correlation is recommended. There is a small right pleural effusion. Electronically Signed   By: Anner Crete M.D.   On: January 29, 2017 00:23    Assessment and Plan:   1. Atrial fibrillation/flutter with rapid ventricular rate. - Rate elevated despite IV Cardizem on exam. Continue amiodarone 200 mg, IV cardizem and metoprolol 25 mg twice a day.  - CHADSVASC score of 6. Not a candidate of anticoagulation due to recurrent falls. - Echocardiogram with low normal EF (same as prior) with apical hypokinesis. Mild LVH; mild MR; severe biatrial enlargement; mild TR with mild to moderate elevation in pulmonary pressure. No RV failure. - Pending TSH  2. Elevated d-dimer - Pending VQ scan  3. Acute on chronic diastolic heart failure - BNP elevated as above. Continue IV diuresis. Net I & O- 2.1 L so far.  4. Acute on chronic kidney disease, stage III - Follow closely with diuresis. Scr improved to 1.7 from 1.82. Renal ultrasound without hydronephrosis.  5. Elevated troponin - Flat trend. Demand in the setting of A. fib RVR and acute CHF.  Jarrett Soho, PA  29-Jan-2017 1:56 PM   Personally seen and examined. Agree with above. 61 year old female with early onset dementia with atrial flutter, rapid ventricular response and acute on chronic diastolic heart failure with the main ischemia.  She's been doing quite well with persistent atrial flutter, last ECG in January 2018 demonstrated distinct flutter wave wears rate control which is being done as an outpatient where amiodarone 200 mg daily and metoprolol 25 mg twice a day. Amiodarone was being  utilized for rate control, not rhythm control.  She is alert  but confused. Heart tachycardic regular, lungs are clear, no edema. She does have ecchymosis on her left inner eye. This is from a prior fall approximately 2 weeks ago.  Here IV Cardizem was added but unsuccessful at adequately rate controlling her. She is currently 120 bpm with 2-1 atrial flutter.   - Instead of diltiazem, I will try increasing her metoprolol which she is currently taking from 25 mg twice a day to 50 mg twice a day.   - Continuing with IV diuresis. Approximately 2 L out. As she continues to diurese, the decreased atrial stretch may end up helping [place] with her adequate rate control of her atrial flutter.  - She has previously been deemed not an anticoagulation candidate.  - Demand ischemia noted.   Candee Furbish, MD

## 2017-01-22 NOTE — Progress Notes (Signed)
  Echocardiogram 2D Echocardiogram has been performed.  Brittany [name] 01/22/2017, 8:39 AM

## 2017-01-23 LAB — BASIC METABOLIC PANEL
Anion gap: 9 (ref 5–15)
BUN: 32 mg/dL — AB (ref 6–20)
CHLORIDE: 95 mmol/L — AB (ref 101–111)
CO2: 33 mmol/L — AB (ref 22–32)
CREATININE: 1.56 mg/dL — AB (ref 0.44–1.00)
Calcium: 9.2 mg/dL (ref 8.9–10.3)
GFR calc Af Amer: 41 mL/min — ABNORMAL LOW (ref >60)
GFR calc non Af Amer: 35 mL/min — ABNORMAL LOW (ref >60)
GLUCOSE: 106 mg/dL — AB (ref 65–99)
Potassium: 3.7 mmol/L (ref 3.5–5.1)
SODIUM: 137 mmol/L (ref 135–145)

## 2017-01-23 LAB — CBC
HCT: 36.4 % (ref 36.0–46.0)
HEMOGLOBIN: 11.3 g/dL — AB (ref 12.0–15.0)
MCH: 25.1 pg — AB (ref 26.0–34.0)
MCHC: 31 g/dL (ref 30.0–36.0)
MCV: 80.7 fL (ref 78.0–100.0)
Platelets: 180 10*3/uL (ref 150–400)
RBC: 4.51 MIL/uL (ref 3.87–5.11)
RDW: 18.2 % — ABNORMAL HIGH (ref 11.5–15.5)
WBC: 6.3 10*3/uL (ref 4.0–10.5)

## 2017-01-23 LAB — HEMOGLOBIN A1C
HEMOGLOBIN A1C: 6.2 % — AB (ref 4.8–5.6)
MEAN PLASMA GLUCOSE: 131 mg/dL

## 2017-01-23 LAB — CALCIUM / CREATININE RATIO, URINE
Calcium, Ur: 3.2 mg/dL
Calcium/Creat.Ratio: 28 mg/g creat (ref 0–260)
Creatinine, Urine: 115.3 mg/dL

## 2017-01-23 MED ORDER — METOPROLOL TARTRATE 25 MG PO TABS
25.0000 mg | ORAL_TABLET | Freq: Two times a day (BID) | ORAL | Status: DC
  Administered 2017-01-23 – 2017-01-26 (×5): 25 mg via ORAL
  Filled 2017-01-23 (×6): qty 1

## 2017-01-23 MED ORDER — LEVOFLOXACIN IN D5W 500 MG/100ML IV SOLN
500.0000 mg | Freq: Once | INTRAVENOUS | Status: AC
  Administered 2017-01-23: 500 mg via INTRAVENOUS
  Filled 2017-01-23: qty 100

## 2017-01-23 NOTE — Care Management Note (Signed)
Case Management Note  Patient Details  Name: Brittany [name] MRN: [id number] Date of Birth: 06-Dec-1955  Subjective/Objective:     CHF              Action/Plan: Patient is [facility] of the [facility]. Earnest Bailey SW is following the patient from [facility]. CM will continue to follow for DCP  Expected Discharge Date:  01/25/17               Expected Discharge Plan:  [place]  Discharge planning Services  CM Consult  Status of Service:  In process, will continue to follow  Sherrilyn Rist [phone] 01/23/2017, 11:08 AM

## 2017-01-23 NOTE — Progress Notes (Addendum)
PROGRESS NOTE  Brittany [name]  UTM:[id number] DOB: 03-Jun-1956 DOA: 01/21/2017  PCP: System, Pcp Not In   Brief Narrative:  Pt is 61 yo female with diastolic CHF, a-flutter with CHADS 2 vasc score 6, not candidate for [facility] die to recurrent falls, CKD stage III, dementia, seizures, presented to [facility] ED with main concern of several days duration of progressively worsening dyspnea. In ED, pt found to be in a-fib with RVR, elevated troponin 0.07. Pt placed on Cardizem drip and BiPAP. TRH asked to admit for further evaluation and cardiology team has been consulted.   Assessment & Plan:   Active Problems:   Acute respiratory failure - secondary to a-fib, acute pulmonary edema, ? PNA - pt has been of BiPAP and is clinically better this AM - she has been on Metoprolol and Amiodarone for a-fib per cardiology - no lasix at this time - given fevers, will place on empiric ABX Levaquin, please see below - keep on oxygen via [place] as we are still not able to taper off due to desaturations     Dementia - will need PT/OT prior to discharge    FUO - noted this am - will check UA and urine culture as pt is not reliable historian - ? PNA on CXR - will start empiric Levaquin, sputum culture, narrow down if no further evidence of PNA - may need repeat CXR if persistent fevers     Acute kidney injury imposed on CKD (chronic kidney disease) stage 3, GFR 30-59 ml/min - Cr overall at baseline and trending down - BMP in AM    Seizures (HCC) - stable for now - keep on Keppra     CHF, acute on chronic diastolic CHF, elevated troponins due to demand ischemia  - per cardiology, volume status stable for now - weight trend since admission: Filed Weights   01/21/17 2354 01/22/17 1400 01/23/17 0407  Weight: 45.4 kg (100 lb) 41 kg (90 lb 4.8 oz) 41.3 kg (91 lb 1.6 oz)  - no lasix for now - strict I/O    Persistent Atrial flutter/ fibrillation with RVR (HCC) - per cardiology, lowered the dose of Metoprolol from  50 mg BID to 25 mg BID due to hypotension - keep on tele monitor     Severe PCM, underweight  - Body mass index is 15.88 kg/m - nutritionist consulted   DVT prophylaxis: Heparin SQ Code Status: Full  Family Communication: Patient at bedside  Disposition Plan: to be determined   Consultants:   Cardiology   Procedures:   None  Antimicrobials:   Levaquin 7/10 -->  Subjective: No events this AM, pt denies chest pain or dyspnea, reports feeling better, gives me "two thumbs up".   Objective: Vitals:   01/23/17 0300 01/23/17 0407 01/23/17 1100 01/23/17 1218  BP:  92/62 (!) 84/63 98/75  Pulse:  (!) 120 87   Resp:  17    Temp: 99.3 F (37.4 C) 98.5 F (36.9 C)    TempSrc:  Oral    SpO2:  100%    Weight:  41.3 kg (91 lb 1.6 oz)    Height:        Intake/Output Summary (Last 24 hours) at 01/23/17 1656 Last data filed at 01/23/17 1435  Gross per 24 hour  Intake              823 ml  Output              550 ml  Net  273 ml   Filed Weights   01/21/17 2354 01/22/17 1400 01/23/17 0407  Weight: 45.4 kg (100 lb) 41 kg (90 lb 4.8 oz) 41.3 kg (91 lb 1.6 oz)    Examination:  General exam: Appears calm and comfortable  Respiratory system: Clear to auscultation. Respiratory effort normal. Cardiovascular system: IRRR, rate controlled, no murmurs Gastrointestinal system: Abdomen is nondistended, soft and nontender. No organomegaly or masses felt. Normal bowel sounds heard. Central nervous system: Alert but confused, moving all 4 extremities equally well Extremities: Symmetric 5 x 5 power.  Data Reviewed: I have personally reviewed following labs and imaging studies  CBC:  Recent Labs Lab 01/22/17 0004 01/22/17 0335 01/23/17 0343  WBC 6.6 7.9 6.3  HGB 12.5 13.1 11.3*  HCT 41.0 42.3 36.4  MCV 81.7 80.9 80.7  PLT 184 164 948   Basic Metabolic Panel:  Recent Labs Lab 01/22/17 0004 01/22/17 0335 01/23/17 0343  NA 140 138 137  K 3.7 5.6* 3.7  CL 102  102 95*  CO2 24 24 33*  GLUCOSE 152* 95 106*  BUN 33* 33* 32*  CREATININE 1.82* 1.70* 1.56*  CALCIUM 9.3 9.4 9.2   Liver Function Tests:  Recent Labs Lab 01/22/17 0335  AST 63*  ALT 27  ALKPHOS 91  BILITOT 0.8  PROT 7.1  ALBUMIN 4.0   Cardiac Enzymes:  Recent Labs Lab 01/22/17 0338 01/22/17 0939 01/22/17 1407  TROPONINI 0.04* 0.04* 0.04*   BNP (last 3 results)  Recent Labs  10/11/16 1033  PROBNP 8,862*   HbA1C:  Recent Labs  01/22/17 1407  HGBA1C 6.2*   Thyroid Function Tests:  Recent Labs  01/22/17 1407  TSH 1.947   Radiology Studies: Dg Chest 1 View  Result Date: 01/22/2017 CLINICAL DATA:  Shortness of breath, pleural effusion, CHF EXAM: CHEST 1 VIEW COMPARISON:  01/21/2017 FINDINGS: Enlargement of cardiac silhouette with pulmonary vascular congestion. Diffuse BILATERAL pulmonary infiltrates likely pulmonary edema, slightly improved. Loculated small RIGHT pleural effusion and tiny LEFT pleural effusion. No pneumothorax or acute osseous findings. Nonunion of an old distal RIGHT clavicular fracture. IMPRESSION: CHF slightly improved since previous exam. Electronically Signed   By: Lavonia Dana M.D.   On: 01/22/2017 17:18   US Renal  Result Date: 01/22/2017 CLINICAL DATA:  Acute renal failure EXAM: RENAL / URINARY TRACT ULTRASOUND COMPLETE COMPARISON:  None. FINDINGS: Right Kidney: Length: 10.4 cm. Increased cortical echogenicity. No hydronephrosis. Left Kidney: Length: 9.2 cm. Increased cortical echogenicity. No hydronephrosis. Small cyst in the upper pole measuring 0.7 x 0.5 x 0.9 cm. Bladder: Appears normal for degree of bladder distention. IMPRESSION: 1. Increased cortical echogenicity consistent with medical renal disease. No hydronephrosis. 2. Small cyst in the upper pole of the left kidney Electronically Signed   By: Donavan Foil M.D.   On: 01/22/2017 03:06   Nm Pulmonary Perf And Vent  Result Date: 01/22/2017 CLINICAL DATA:  Elevated D-dimer.  Short of  breath. EXAM: NUCLEAR MEDICINE VENTILATION - PERFUSION LUNG SCAN TECHNIQUE: Ventilation images were obtained in multiple projections using inhaled aerosol Tc-39m DTPA. Perfusion images were obtained in multiple projections after intravenous injection of Tc-16m MAA. RADIOPHARMACEUTICALS:  32.6 mCi Technetium-42m DTPA aerosol inhalation and 4.2 mCi Technetium-67m MAA IV COMPARISON:  Chest radiograph 01/22/2017 FINDINGS: Ventilation: Multiple small peripheral ventilation defects. Poor ventilation in the upper lobes. Perfusion: Multiple small peripheral perfusion defects matches the ventilation defects . No large wedge-shaped peripheral perfusion defects. IMPRESSION: 1. No evidence of acute pulmonary embolism. 2. Matched small peripheral ventilation and  perfusion defects most suggestive of COPD. Electronically Signed   By: Suzy Bouchard M.D.   On: 01/22/2017 17:20   Dg Chest Portable 1 View  Result Date: 01/22/2017 CLINICAL DATA:  61 year old female with shortness of breath. EXAM: PORTABLE CHEST 1 VIEW COMPARISON:  Chest radiograph dated 08/04/2016 FINDINGS: Bilateral large patchy airspace opacities primarily involving the mid to lower lung fields noted which may represent pneumonia, or pulmonary edema. If there is history of malignancy, and infiltrative process or lymphangitic spread of tumor is not excluded. There is a small right pleural effusion. No pneumothorax. The cardiac silhouette is enlarged. No acute osseous pathology. IMPRESSION: Cardiomegaly with large bilateral pulmonary infiltrates, likely pulmonary edema. Pneumonia is not excluded. Clinical correlation is recommended. There is a small right pleural effusion. Electronically Signed   By: Anner Crete M.D.   On: 01/22/2017 00:23    Scheduled Meds: . amiodarone  200 mg Oral Daily  . aspirin EC  325 mg Oral Daily  . cholecalciferol  2,000 Units Oral Daily  . donepezil  10 mg Oral QHS  . feeding supplement (ENSURE ENLIVE)  237 mL Oral BID BM    . heparin  5,000 Units Subcutaneous Q8H  . lamoTRIgine  100 mg Oral BID  . latanoprost  1 drop Both Eyes QHS  . levETIRAcetam  750 mg Oral BID  . lipase/protease/amylase  24,000 Units Oral TID WC  . memantine  5 mg Oral BID  . metoprolol tartrate  25 mg Oral BID  . QUEtiapine  12.5 mg Oral QPM  . sodium chloride flush  3 mL Intravenous Q12H  . sodium chloride flush  3 mL Intravenous Q12H   Continuous Infusions: . sodium chloride       LOS: 1 day   Time spent: 25 minutes   Faye Ramsay, MD [facility] Pager [phone]  If 7PM-7AM, please contact night-coverage [email or web address] Password [email or web address] 01/23/2017, 4:56 PM

## 2017-01-23 NOTE — Plan of Care (Signed)
Problem: Food- and Nutrition-Related Knowledge Deficit (NB-1.1) Goal: Nutrition education Formal process to instruct or train a patient/client in a skill or to impart knowledge to help patients/clients voluntarily manage or modify food choices and eating behavior to maintain or improve health. Outcome: Completed/Met Date Met: 01/23/17 RD provided "Low Sodium Nutrition Therapy" handout from the Academy of [employer] Dietetics. Reviewed patient's dietary recall. Provided examples on ways to decrease sodium intake in diet. Discouraged intake of processed foods and use of salt shaker. Encouraged fresh fruits and vegetables as well as whole grain sources of carbohydrates to maximize fiber intake.   RD discussed why it is important for patient to adhere to diet recommendations, and emphasized the role of fluids, foods to avoid, and importance of weighing self daily. Teach back method used.  Expect fair compliance.

## 2017-01-23 NOTE — Progress Notes (Signed)
Page to [facility] regarding patient's low BP and no cardiac medications given yet today, because of it.   Will hold amiodarone, metoprolol and lasix. Pt's trend of Bps has steadily dropped since admission; this is the first reading (verified, pt awake prior to checking) below 90 sytolic

## 2017-01-23 NOTE — Progress Notes (Signed)
Patients HR sustaining 130's, NP notified. Patient says she feels fine, just worried about her condition and the fact that her family isn't here. Meds were held during first shift. Will go ahead and give currently due meds.

## 2017-01-23 NOTE — Progress Notes (Signed)
Initial Nutrition Assessment  DOCUMENTATION CODES:   Severe malnutrition in context of chronic illness, Underweight  INTERVENTION:  Ensure Enlive po BID, each supplement provides 350 kcal and 20 grams of protein  Low sodium education provided, see plan of care note.   NUTRITION DIAGNOSIS:   Malnutrition (Severe ) related to chronic illness (CHF/CKD stage III) as evidenced by severe depletion of muscle mass, severe depletion of body fat.  GOAL:   Patient will meet greater than or equal to 90% of their needs  MONITOR:   PO intake, Supplement acceptance, Weight trends  REASON FOR ASSESSMENT:   Malnutrition Screening Tool   ASSESSMENT:   Pt with PMH of CHF, CVA, diastolic CHF, HTN, CKD stage III, pancreatitis, dementia due to alcohol, and COPD. Admitted for dyspnea in setting of diastolic CHF and ARF.   Pt reports having a loss of appetite due to her sodium restricted diet. The pt son states she will not eat food, unless salt is added to it. The son does most of the cooking in the house and has tried to cut back on his mothers salt intake. Son quotes pt will "climb on top of counters to reach the hidden salt". [facility], where she gets breakfast and lunch. Son thinks she might be consuming salt there as well. Discussed with son and pt the importance of low sodium diet compliance in regard to pt's CHF and CKD. Suspect pt meets criteria for decreased energy intake, but more specifics are needed to justify.   The son reports pt's UBW stays around 100 lbs. Records indicate pt was last at that wt in January 2018, a 9% wt loss in 6 months. This is not a significant time frame, and the pt's fluid accumulation makes it hard get an accurate dry weight.   Pt has past nutrition diagnosis of severe protein-calorie malnutrition. This admission's Nutrition-Focused physical exam shows to be consistent with pt's last admission. Findings are severe fat depletion, severe muscle  depletion, and mild edema.   Medications reviewed and include: Vit D, lasix IV, creon Labs reviewed: BUN 32 (H), CREAT 1.56 (H), Hgb A1c 6.2 (H)   Diet Order:  Diet Heart Room service appropriate? Yes; Fluid consistency: Thin  Skin:  Reviewed, no issues  Last BM:  01/22/17  Height:   Ht Readings from Last 1 Encounters:  01/22/17 5' 3.5" (1.613 m)    Weight:   Wt Readings from Last 1 Encounters:  01/23/17 91 lb 1.6 oz (41.3 kg)    Ideal Body Weight:  52.3 kg  BMI:  Body mass index is 15.88 kg/m.  Estimated Nutritional Needs:   Kcal:  1600-1800 (30-34 kcals/kg IBW)  Protein:  80-90 grams (1.5- 1.7 g/kg IBW)  Fluid:  > 1.6 L/day  EDUCATION NEEDS:   Education needs addressed  Mariana Single RD, LDN 01/23/2017 3:16 PM

## 2017-01-23 NOTE — Progress Notes (Signed)
Patient has period on confusion, daughter has been at bedside. Patient does not seem to understand plan of care although she has been educated.

## 2017-01-23 NOTE — Progress Notes (Addendum)
Progress Note  Patient Name: Brittany [name] Date of Encounter: 01/23/2017  Primary Cardiologist: Dr. Tamala Julian & [name], NP  Subjective   Feeling good, jovial, no CP or SOB. No LEE.  Inpatient Medications    Scheduled Meds: . amiodarone  200 mg Oral Daily  . aspirin EC  325 mg Oral Daily  . cholecalciferol  2,000 Units Oral Daily  . donepezil  10 mg Oral QHS  . feeding supplement (ENSURE ENLIVE)  237 mL Oral BID BM  . furosemide  20 mg Intravenous Daily  . heparin  5,000 Units Subcutaneous Q8H  . lamoTRIgine  100 mg Oral BID  . latanoprost  1 drop Both Eyes QHS  . levETIRAcetam  750 mg Oral BID  . lipase/protease/amylase  24,000 Units Oral TID WC  . memantine  5 mg Oral BID  . metoprolol tartrate  50 mg Oral BID  . QUEtiapine  12.5 mg Oral QPM  . sodium chloride flush  3 mL Intravenous Q12H  . sodium chloride flush  3 mL Intravenous Q12H   Continuous Infusions: . sodium chloride     PRN Meds: sodium chloride, acetaminophen **OR** acetaminophen, albuterol, sodium chloride flush   Vital Signs    Vitals:   01/23/17 0207 01/23/17 0300 01/23/17 0407 01/23/17 1100  BP: 95/71  92/62 (!) 84/63  Pulse: (!) 124  (!) 120 87  Resp: 17  17   Temp: (!) 100.5 F (38.1 C) 99.3 F (37.4 C) 98.5 F (36.9 C)   TempSrc: Oral  Oral   SpO2: 100%  100%   Weight:   91 lb 1.6 oz (41.3 kg)   Height:        Intake/Output Summary (Last 24 hours) at 01/23/17 1204 Last data filed at 01/23/17 0842  Gross per 24 hour  Intake              583 ml  Output             1650 ml  Net            -1067 ml   Filed Weights   01/21/17 2354 01/22/17 1400 01/23/17 0407  Weight: 100 lb (45.4 kg) 90 lb 4.8 oz (41 kg) 91 lb 1.6 oz (41.3 kg)    Telemetry    Atrial flutter currently rate controlled - Personally Reviewed  Physical Exam   GEN: No acute distress.  HEENT: Normocephalic, atraumatic, sclera non-icteric. Neck: No JVD or bruits. Cardiac: irregular, rate controlled, no murmurs,  rubs, or gallops.  Radials/DP/PT 1+ and equal bilaterally.  Respiratory: Clear to auscultation bilaterally. Breathing is unlabored. GI: Soft, nontender, non-distended, BS +x 4. MS: no deformity. Extremities: No clubbing or cyanosis. No edema. Distal pedal pulses are 2+ and equal bilaterally. Neuro:  AAO to self and place, thinks it's winter because of how hold the room felt. Follows commands. Psych:  Pleasantly demented.  Labs    Chemistry Recent Labs Lab 01/22/17 0004 01/22/17 0335 01/23/17 0343  NA 140 138 137  K 3.7 5.6* 3.7  CL 102 102 95*  CO2 24 24 33*  GLUCOSE 152* 95 106*  BUN 33* 33* 32*  CREATININE 1.82* 1.70* 1.56*  CALCIUM 9.3 9.4 9.2  PROT  --  7.1  --   ALBUMIN  --  4.0  --   AST  --  63*  --   ALT  --  27  --   ALKPHOS  --  91  --   BILITOT  --  0.8  --   GFRNONAA 29* 32* 35*  GFRAA 34* 37* 41*  ANIONGAP 14 12 9      Hematology Recent Labs Lab 01/22/17 0004 01/22/17 0335 01/23/17 0343  WBC 6.6 7.9 6.3  RBC 5.02 5.23* 4.51  HGB 12.5 13.1 11.3*  HCT 41.0 42.3 36.4  MCV 81.7 80.9 80.7  MCH 24.9* 25.0* 25.1*  MCHC 30.5 31.0 31.0  RDW 18.4* 18.1* 18.2*  PLT 184 164 180    Cardiac Enzymes Recent Labs Lab 01/22/17 0338 01/22/17 0939 01/22/17 1407  TROPONINI 0.04* 0.04* 0.04*    Recent Labs Lab 01/22/17 0008  TROPIPOC 0.07     BNP Recent Labs Lab 01/22/17 0004 01/22/17 0335  BNP 1,761.8* 1,507.3*     DDimer  Recent Labs Lab 01/22/17 0338  DDIMER 1.41*     Radiology    Dg Chest 1 View  Result Date: 01/22/2017 CLINICAL DATA:  Shortness of breath, pleural effusion, CHF EXAM: CHEST 1 VIEW COMPARISON:  01/21/2017 FINDINGS: Enlargement of cardiac silhouette with pulmonary vascular congestion. Diffuse BILATERAL pulmonary infiltrates likely pulmonary edema, slightly improved. Loculated small RIGHT pleural effusion and tiny LEFT pleural effusion. No pneumothorax or acute osseous findings. Nonunion of an old distal RIGHT clavicular  fracture. IMPRESSION: CHF slightly improved since previous exam. Electronically Signed   By: Lavonia Dana M.D.   On: 01/22/2017 17:18   US Renal  Result Date: 01/22/2017 CLINICAL DATA:  Acute renal failure EXAM: RENAL / URINARY TRACT ULTRASOUND COMPLETE COMPARISON:  None. FINDINGS: Right Kidney: Length: 10.4 cm. Increased cortical echogenicity. No hydronephrosis. Left Kidney: Length: 9.2 cm. Increased cortical echogenicity. No hydronephrosis. Small cyst in the upper pole measuring 0.7 x 0.5 x 0.9 cm. Bladder: Appears normal for degree of bladder distention. IMPRESSION: 1. Increased cortical echogenicity consistent with medical renal disease. No hydronephrosis. 2. Small cyst in the upper pole of the left kidney Electronically Signed   By: Donavan Foil M.D.   On: 01/22/2017 03:06   Nm Pulmonary Perf And Vent  Result Date: 01/22/2017 CLINICAL DATA:  Elevated D-dimer.  Short of breath. EXAM: NUCLEAR MEDICINE VENTILATION - PERFUSION LUNG SCAN TECHNIQUE: Ventilation images were obtained in multiple projections using inhaled aerosol Tc-66m DTPA. Perfusion images were obtained in multiple projections after intravenous injection of Tc-81m MAA. RADIOPHARMACEUTICALS:  32.6 mCi Technetium-54m DTPA aerosol inhalation and 4.2 mCi Technetium-66m MAA IV COMPARISON:  Chest radiograph 01/22/2017 FINDINGS: Ventilation: Multiple small peripheral ventilation defects. Poor ventilation in the upper lobes. Perfusion: Multiple small peripheral perfusion defects matches the ventilation defects . No large wedge-shaped peripheral perfusion defects. IMPRESSION: 1. No evidence of acute pulmonary embolism. 2. Matched small peripheral ventilation and perfusion defects most suggestive of COPD. Electronically Signed   By: Suzy Bouchard M.D.   On: 01/22/2017 17:20   Dg Chest Portable 1 View  Result Date: 01/22/2017 CLINICAL DATA:  61 year old female with shortness of breath. EXAM: PORTABLE CHEST 1 VIEW COMPARISON:  Chest radiograph dated  08/04/2016 FINDINGS: Bilateral large patchy airspace opacities primarily involving the mid to lower lung fields noted which may represent pneumonia, or pulmonary edema. If there is history of malignancy, and infiltrative process or lymphangitic spread of tumor is not excluded. There is a small right pleural effusion. No pneumothorax. The cardiac silhouette is enlarged. No acute osseous pathology. IMPRESSION: Cardiomegaly with large bilateral pulmonary infiltrates, likely pulmonary edema. Pneumonia is not excluded. Clinical correlation is recommended. There is a small right pleural effusion. Electronically Signed   By: Anner Crete M.D.   On:  01/22/2017 00:23    Cardiac Studies   2D echo 01/22/17 Study Conclusions - Left ventricle: The cavity size was normal. Wall thickness was   increased in a pattern of mild LVH. Systolic function was normal.   The estimated ejection fraction was in the range of 50% to 55%.   There is hypokinesis of the apical myocardium. The study is not   technically sufficient to allow evaluation of LV diastolic   function. - Mitral valve: Calcified annulus. There was mild regurgitation. - Left atrium: The atrium was severely dilated. - Right atrium: The atrium was severely dilated. - Pulmonary arteries: Systolic pressure was mildly to moderately   increased. PA peak pressure: 47 mm Hg (S). Impressions: - Apical hypokinesis with overall low normal LV systolic function;   mild LVH; mild MR; severe biatrial enlargement; mild TR with mild   to moderate elevation in pulmonary pressure.  Patient Profile     61 y.o. female with chronic diastolic heart failure, persistent atrial fibrillation/flutter, no longer on anticoagulation due to frequent fall, dementia, CVA, EKG, COPD, seizure, hypertension, PAD, CKD stage III, chronic alcoholism and pancreatitis admitted for worsening dyspnea, found to have rapid HR and recurrent CHF - has had 2 prior admissions this year for [facility]  in setting of eating a lot of salt.  Assessment & Plan    1. Persistent atrial fib/flutter with rapid response (may be permanent at this point) - rate currently controlled. Has refused anticoagulation. Prior EKGs back to 07/2016 show atrial fib/flutter. Amiodarone has been used for rate control in setting of low BP. Would continue. Given lower BP today (recheck 98/75), will discuss regimen with MD going forward. May need to reduce metoprolol dose to 25mg  BID. Asked nurse to hold AM dose of 50mg . Suspect hypotension is due partly to malnutrition and failure to thrive.  2. Acute on chronic diastolic CHF - volume status appears to be stable today, in fact BP running low. Son says he does not know how she keeps getting a hold of high sodium foods but always seems to find a way. Discussed with patient but she clearly has limited understanding of this. As BP is running softer will hold further Lasix and reassess in AM.   3. Elevated troponin - felt due to demand ischemia, no further workup planned.  4. AKI on CKD stage III - Cr back to baseline.  Signed, Charlie Pitter, PA-C  01/23/2017, 12:04 PM    Personally seen and examined. Agree with above.  Feels better, no CP or SOB. Currently sleeping comfortably with sheet over head Dementia pronounced, Irreg irreg, CTAB, no edema  Atrial flutter  - continue amio and metoprolol for rate control.   - Decreased metoprolol back to 25 BID due to hypotension (thin)  Diastolic HF  - improved today. Diet discussed. May wish to restart lasix tomorrow  Demand ischemia  - mild flat troponin  Candee Furbish, MD

## 2017-01-23 NOTE — Progress Notes (Addendum)
Pharmacy Antibiotic Note  Brittany [name] is a 61 y.o. female admitted on 01/21/2017 with pneumonia.  Pharmacy has been consulted for Levofloxacin dosing.  Pt with acute respiratory failure, on [place]-4L.  WBC wnl and sputum/urine cultures pending.  Will adjust dosing for renal function .  Pt is on Amiodarone and Seroquel which may prolong QTc, in addition to Levofloxacin.  EKG on 7/9 with QTc 432.  Paged KSchorrPA on call to discuss- preferred we give Levofloxacin x 1 dose & leave sticky note for DrMyers to f/u on 7/11.  Plan: Levofloxacin 500mg  IV x 1 Left sticky note regarding above interaction  F/U cultures Will f/u with MD tomorrow regarding antibiotic options  Height: 5' 3.5" (161.3 cm) Weight: 91 lb 1.6 oz (41.3 kg) (Scale A) IBW/kg (Calculated) : 53.55  Temp (24hrs), Avg:99.2 F (37.3 C), Min:98.4 F (36.9 C), Max:100.5 F (38.1 C)   Recent Labs Lab 01/22/17 0004 01/22/17 0335 01/23/17 0343  WBC 6.6 7.9 6.3  CREATININE 1.82* 1.70* 1.56*    Estimated Creatinine Clearance: 25.3 mL/min (A) (by C-G formula based on SCr of 1.56 mg/dL (H)).    No Known Allergies    Thank you for allowing pharmacy to be a part of this patient's care.  Lewie Chamber., PharmD Clinical Pharmacist [facility]

## 2017-01-24 ENCOUNTER — Inpatient Hospital Stay (HOSPITAL_COMMUNITY): Payer: Medicare (Managed Care)

## 2017-01-24 DIAGNOSIS — R569 Unspecified convulsions: Secondary | ICD-10-CM

## 2017-01-24 DIAGNOSIS — I5031 Acute diastolic (congestive) heart failure: Secondary | ICD-10-CM

## 2017-01-24 DIAGNOSIS — R634 Abnormal weight loss: Secondary | ICD-10-CM

## 2017-01-24 DIAGNOSIS — N183 Chronic kidney disease, stage 3 (moderate): Secondary | ICD-10-CM

## 2017-01-24 MED ORDER — IOPAMIDOL (ISOVUE-300) INJECTION 61%
INTRAVENOUS | Status: AC
  Filled 2017-01-24: qty 30

## 2017-01-24 NOTE — Progress Notes (Signed)
Progress Note  Patient Name: Brittany [name] Date of Encounter: 01/24/2017  Primary Cardiologist: Dr. Tamala Julian   Subjective   Pt reports she feels comfortable at rest. Still on supplemental O2, now on 3 L, previously 4L/min. She denies CP and palpitations, despite resting HR in the 120s currently. She is asymptomatic. No dizziness.    Inpatient Medications    Scheduled Meds: . iopamidol      . amiodarone  200 mg Oral Daily  . aspirin EC  325 mg Oral Daily  . cholecalciferol  2,000 Units Oral Daily  . donepezil  10 mg Oral QHS  . feeding supplement (ENSURE ENLIVE)  237 mL Oral BID BM  . heparin  5,000 Units Subcutaneous Q8H  . lamoTRIgine  100 mg Oral BID  . latanoprost  1 drop Both Eyes QHS  . levETIRAcetam  750 mg Oral BID  . lipase/protease/amylase  24,000 Units Oral TID WC  . memantine  5 mg Oral BID  . metoprolol tartrate  25 mg Oral BID  . QUEtiapine  12.5 mg Oral QPM  . sodium chloride flush  3 mL Intravenous Q12H  . sodium chloride flush  3 mL Intravenous Q12H   Continuous Infusions: . sodium chloride     PRN Meds: sodium chloride, acetaminophen **OR** acetaminophen, albuterol, sodium chloride flush   Vital Signs    Vitals:   01/23/17 1218 01/23/17 2011 01/23/17 2130 01/24/17 0618  BP: 98/75 91/70 94/69  98/62  Pulse:   (!) 131 (!) 110  Resp:    20  Temp:  98.7 F (37.1 C)  97.7 F (36.5 C)  TempSrc:  Oral  Oral  SpO2:  100%  100%  Weight:    94 lb 1.6 oz (42.7 kg)  Height:        Intake/Output Summary (Last 24 hours) at 01/24/17 1037 Last data filed at 01/24/17 0412  Gross per 24 hour  Intake              723 ml  Output              200 ml  Net              523 ml   Filed Weights   01/22/17 1400 01/23/17 0407 01/24/17 0618  Weight: 90 lb 4.8 oz (41 kg) 91 lb 1.6 oz (41.3 kg) 94 lb 1.6 oz (42.7 kg)    Telemetry    Atrial fibrillation/ flutter w/ RVR 120s - Personally Reviewed  ECG    Ectopic atrial tachycardia  - Personally  Reviewed  Physical Exam   GEN: No acute distress. Frail.  Neck: No JVD Cardiac: irregular rhythm with tachy rate Respiratory: no distress, decreased BS bilaterally GI: Soft, nontender, non-distended  MS: No edema; No deformity. Neuro:  Nonfocal  Psych: Normal affect   Labs    Chemistry Recent Labs Lab 01/22/17 0004 01/22/17 0335 01/23/17 0343  NA 140 138 137  K 3.7 5.6* 3.7  CL 102 102 95*  CO2 24 24 33*  GLUCOSE 152* 95 106*  BUN 33* 33* 32*  CREATININE 1.82* 1.70* 1.56*  CALCIUM 9.3 9.4 9.2  PROT  --  7.1  --   ALBUMIN  --  4.0  --   AST  --  63*  --   ALT  --  27  --   ALKPHOS  --  91  --   BILITOT  --  0.8  --   GFRNONAA 29* 32* 35*  GFRAA 34*  37* 41*  ANIONGAP 14 12 9      Hematology Recent Labs Lab 01/22/17 0004 01/22/17 0335 01/23/17 0343  WBC 6.6 7.9 6.3  RBC 5.02 5.23* 4.51  HGB 12.5 13.1 11.3*  HCT 41.0 42.3 36.4  MCV 81.7 80.9 80.7  MCH 24.9* 25.0* 25.1*  MCHC 30.5 31.0 31.0  RDW 18.4* 18.1* 18.2*  PLT 184 164 180    Cardiac Enzymes Recent Labs Lab 01/22/17 0338 01/22/17 0939 01/22/17 1407  TROPONINI 0.04* 0.04* 0.04*    Recent Labs Lab 01/22/17 0008  TROPIPOC 0.07     BNP Recent Labs Lab 01/22/17 0004 01/22/17 0335  BNP 1,761.8* 1,507.3*     DDimer  Recent Labs Lab 01/22/17 0338  DDIMER 1.41*     Radiology    Dg Chest 1 View  Result Date: 01/22/2017 CLINICAL DATA:  Shortness of breath, pleural effusion, CHF EXAM: CHEST 1 VIEW COMPARISON:  01/21/2017 FINDINGS: Enlargement of cardiac silhouette with pulmonary vascular congestion. Diffuse BILATERAL pulmonary infiltrates likely pulmonary edema, slightly improved. Loculated small RIGHT pleural effusion and tiny LEFT pleural effusion. No pneumothorax or acute osseous findings. Nonunion of an old distal RIGHT clavicular fracture. IMPRESSION: CHF slightly improved since previous exam. Electronically Signed   By: Lavonia Dana M.D.   On: 01/22/2017 17:18   Nm Pulmonary Perf  And Vent  Result Date: 01/22/2017 CLINICAL DATA:  Elevated D-dimer.  Short of breath. EXAM: NUCLEAR MEDICINE VENTILATION - PERFUSION LUNG SCAN TECHNIQUE: Ventilation images were obtained in multiple projections using inhaled aerosol Tc-4m DTPA. Perfusion images were obtained in multiple projections after intravenous injection of Tc-85m MAA. RADIOPHARMACEUTICALS:  32.6 mCi Technetium-12m DTPA aerosol inhalation and 4.2 mCi Technetium-13m MAA IV COMPARISON:  Chest radiograph 01/22/2017 FINDINGS: Ventilation: Multiple small peripheral ventilation defects. Poor ventilation in the upper lobes. Perfusion: Multiple small peripheral perfusion defects matches the ventilation defects . No large wedge-shaped peripheral perfusion defects. IMPRESSION: 1. No evidence of acute pulmonary embolism. 2. Matched small peripheral ventilation and perfusion defects most suggestive of COPD. Electronically Signed   By: Suzy Bouchard M.D.   On: 01/22/2017 17:20    Cardiac Studies   2D echo 01/22/17 Study Conclusions - Left ventricle: The cavity size was normal. Wall thickness was increased in a pattern of mild LVH. Systolic function was normal. The estimated ejection fraction was in the range of 50% to 55%. There is hypokinesis of the apical myocardium. The study is not technically sufficient to allow evaluation of LV diastolic function. - Mitral valve: Calcified annulus. There was mild regurgitation. - Left atrium: The atrium was severely dilated. - Right atrium: The atrium was severely dilated. - Pulmonary arteries: Systolic pressure was mildly to moderately increased. PA peak pressure: 47 mm Hg (S). Impressions: - Apical hypokinesis with overall low normal LV systolic function; mild LVH; mild MR; severe biatrial enlargement; mild TR with mild to moderate elevation in pulmonary pressure.  Patient Profile     61 y.o. female with chronic diastolic heart failure, persistent atrial  fibrillation/flutter, prior h/o CVA, however no longer on anticoagulation due to frequent falls, dementia and chronic alcoholism. Also h/o COPD, seizure, hypertension, PAD, CKD stage III, and pancreatitis admitted for worsening dyspnea, found to have rapid HR and recurrent CHF - has had 2 prior admissions this year for [facility] in setting of dietary indiscretion with sodium.   Assessment & Plan    1. Acute on Chronic Diastolic CHF: echo 09/19/12 showed normal LVEF at 50-55%. Admit BNP markedly abnormal at 1,507. Admit weight 100  lb. Down to 90 lb, but slighly increased over the last 2 days now at 94 lb. Net I/Os -1.5L total. No peripheral edema noted on exam. Decreased BS at the bases bilaterally. Lasix currently on hold. Will continue to hold this am due to borderline hypotension. Low salt diet.   2. Persistent Atrial Fibrillation/Flutter w/ RVR: It has been documented that patient has refused anticoagulation. Also high bleed risk due to falls, fragility, dementia and h/o chronic alcoholism. Amiodarone has been used for rate control, given issues with soft BP. Metoprolol also ordered. However given soft BP, dose has been  reduced and held this morning. Given she is not on a/c, would not be a candidate for DCCV. However given increased resting ventricular rate in the 120s and borderline hypotension, may need to consider IV amiodarone for additional rate control. Luckily she is asymptomatic at the time. Will discuss plan with MD. SBP in the low 90s. K stable. Continue to monitor closely.   3. Abnormal Troponin: flat low level trend, 0.04 x 3 not c/w ACS. Likely demand ischemia from acute CHF and afib. she denies CP. No ischemic c/u indicated at this time.   4. Acute on CKD: SCr improving with diuresis, from 1.82>>1.70>>1.56 (baseline ~1.5-1.6). Continue to monitor closely with diuresis.   5. Abdominal Tenderness/Weight Loss/ Hepatomegaly: Abdominal/Pelvic CT ordered by primary team. They will f/u on results.    Signed, Lyda Jester, PA-C  01/24/2017, 10:37 AM    Personally seen and examined. Agree with above.  61 year old with dementia, persistent flutter, acute diastolic HF  Confused but pleasant. RRR, CTAB, no edema  - Appears euvolemic  - Holding lasix  - Tomorrow restart 40mg  PO QD lasix  - Flutter will remain challenging to control. Will continue with current amiodarone dose.   Candee Furbish, MD

## 2017-01-24 NOTE — Evaluation (Signed)
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  Clinical Impression   Pt admitted with above diagnosis. Pt currently with functional limitations due to the deficits listed below (see PT Problem List). Limited mobility eval today as Brittany [name] was unable to stand on her L foot with IV in it; Her HR remained stable throughout session; Session conducted on supplemental O2 -- she is not normally on supplemental O2 at home; will plan to evaluate for need for supplemental O2 once she is ambulatory;  Pt will benefit from skilled PT to increase their independence and safety with mobility to allow discharge to the venue listed below.       Follow Up Recommendations Other (comment) ([facility]); Will need more info re: the [facility] services Brittany [name] receives; does she attend the day program there and can get PT at [facility]? If she is more homebound, does [facility] provide HHPT?    Equipment Recommendations  Other (comment) (to be determined once IV is out)    Recommendations for Other Services       Precautions / Restrictions Precautions Precautions: Fall      Mobility  Bed Mobility Overal bed mobility: Needs Assistance Bed Mobility: Supine to Sit;Sit to Supine     Supine to sit: Supervision Sit to supine: Supervision   General bed mobility comments: Supervision for safety  Transfers Overall transfer level: Needs assistance Equipment used: 2 person hand held assist Transfers: Sit to/from Stand Sit to Stand: Mod assist         General transfer  comment: Mod assist to steady and to power up; noting pt hyperfocused on L foot discomfort and need for assist  Ambulation/Gait Ambulation/Gait assistance: Mod assist Ambulation Distance (Feet):  (pivot steps bed to chair) Assistive device: 2 person hand held assist Gait Pattern/deviations: Shuffle (mostly hop-pivot on R foot)     General Gait Details: not putting weight on L foot  Stairs            Wheelchair Mobility    Modified Rankin (Stroke Patients Only)       Balance Overall balance assessment: Needs assistance   Sitting balance-Leahy Scale: Good       Standing balance-Leahy Scale: Poor                               Pertinent Vitals/Pain Pain Assessment: Faces Faces Pain Scale: Hurts whole lot Pain Location: L foot with weight bearing due to IV in foot Pain Descriptors / Indicators: Discomfort;Grimacing;Guarding (does not put weight on foot) Pain Intervention(s): Monitored during session    Home Living Family/patient expects to be discharged to:: Private residence Living Arrangements: Children Available Help at Discharge: Family;Available 24 hours/day Type of Home: House Home Access: Stairs to enter Entrance Stairs-Rails: Can reach both Entrance Stairs-Number of Steps: 2 Home Layout: Two level Home Equipment: Walker - 2 wheels Additional Comments: Pt is an unreliable historian, and daughter arroved towards end of session; this information will need to be verified    Prior Function Level of Independence: Independent with assistive device(s)  Comments: Noted in previous admission that pt has an orthotic lift for her shoe; during today's session neither pt nor daughter mentioned this     Hand Dominance   Dominant Hand: Right    Extremity/Trunk Assessment   Upper Extremity Assessment Upper Extremity Assessment: Overall WFL for tasks assessed    Lower Extremity Assessment Lower Extremity Assessment: Generalized  weakness;LLE deficits/detail LLE Deficits / Details: Did not put weight on her L foot due to discomfort with IV in dorsum of foot       Communication   Communication: No difficulties  Cognition Arousal/Alertness: Awake/alert Behavior During Therapy: Restless Overall Cognitive Status: History of cognitive impairments - at baseline                                 General Comments: History of dementia; unreliable historian; mood and social responses varied wildly within session      General Comments General comments (skin integrity, edema, etc.): HR range 117-121 throughout session    Exercises     Assessment/Plan    PT Assessment Patient needs continued PT services  PT Problem List Decreased strength;Decreased activity tolerance;Decreased balance;Decreased mobility;Decreased coordination;Decreased cognition;Decreased knowledge of use of DME;Decreased safety awareness;Cardiopulmonary status limiting activity       PT Treatment Interventions DME instruction;Gait training;Stair training;Functional mobility training;Therapeutic activities;Therapeutic exercise;Balance training;Neuromuscular re-education;Cognitive remediation;Patient/family education    PT Goals (Current goals can be found in the Care Plan section)  Acute Rehab PT Goals Patient Stated Goal: did not state PT Goal Formulation: Patient unable to participate in goal setting Time For Goal Achievement: 02/07/17 Potential to Achieve Goals: Fair    Frequency Min 3X/week   Barriers to discharge Other (comment) Need more information re: home environment, and [facility] services she recieves    Co-evaluation               [facility] PT "6 Clicks" Daily Activity  Outcome Measure Difficulty turning over in bed (including adjusting bedclothes, sheets and blankets)?: None Difficulty moving from lying on back to sitting on the side of the bed? : None Difficulty sitting down on and standing up from a chair with arms  (e.g., wheelchair, bedside commode, etc,.)?: Total Help needed moving to and from a bed to chair (including a wheelchair)?: A Lot Help needed walking in hospital room?: A Lot Help needed climbing 3-5 steps with a railing? : A Lot 6 Click Score: 15    End of Session Equipment Utilized During Treatment: Oxygen Activity Tolerance: Patient tolerated treatment well Patient left: in chair;with call bell/phone within reach;with family/visitor present Nurse Communication: Mobility status PT Visit Diagnosis: Difficulty in walking, not elsewhere classified (R26.2)    Time: 2458-0998 PT Time Calculation (min) (ACUTE ONLY): 23 min   Charges:   PT Evaluation $PT Eval Moderate Complexity: 1 Procedure PT Treatments $Therapeutic Activity: 8-22 mins   PT G Codes:        Roney Marion, PT  Acute Rehabilitation Services Pager [phone] Office [place] 01/24/2017, 2:50 PM

## 2017-01-24 NOTE — Progress Notes (Signed)
Family Requesting IV be removed from foot however IV team tried twice with no success.

## 2017-01-24 NOTE — Progress Notes (Signed)
[facility] PROGRESS NOTE  Brittany [name] WNU:[id number] DOB: 1956/06/12 DOA: 01/21/2017  PCP: System, Pcp Not In  Brief History/Interval Summary: [name] with a past medical history of diastolic CHF, atrial flutter, not thought to be a candidate for anticoagulation due to history of recurrent falls, chronic kidney disease stage III, dementia, seizures, presented with several day history of progressively worsening shortness of breath. Patient was found to be in rapid atrial fibrillation and was noted to have acute pulmonary edema. She was hospitalized for further management.  Reason for Visit: Acute diastolic CHF.  Consultants: Cardiology  Procedures:  Transthoracic echocardiogram Study Conclusions  - Left ventricle: The cavity size was normal. Wall thickness was   increased in a pattern of mild LVH. Systolic function was normal.   The estimated ejection fraction was in the range of 50% to 55%.   There is hypokinesis of the apical myocardium. The study is not   technically sufficient to allow evaluation of LV diastolic   function. - Mitral valve: Calcified annulus. There was mild regurgitation. - Left atrium: The atrium was severely dilated. - Right atrium: The atrium was severely dilated. - Pulmonary arteries: Systolic pressure was mildly to moderately   increased. PA peak pressure: 47 mm Hg (S).  Impressions:  - Apical hypokinesis with overall low normal LV systolic function;   mild LVH; mild MR; severe biatrial enlargement; mild TR with mild   to moderate elevation in pulmonary pressure.  Antibiotics: Levaquin  Subjective/Interval History: Patient very concerned about her weight loss. She tells me that she's lost about 20-30 pounds in the last several months. Breathing has improved.  ROS: No chest pain  Objective:  Vital Signs  Vitals:   01/23/17 2011 01/23/17 2130 01/24/17 0618 01/24/17 1146  BP: 91/70 94/69 98/62  (!) 86/65    Pulse:  (!) 131 (!) 110 (!) 50  Resp:   20 18  Temp: 98.7 F (37.1 C)  97.7 F (36.5 C) 97.9 F (36.6 C)  TempSrc: Oral  Oral Oral  SpO2: 100%  100% 100%  Weight:   42.7 kg (94 lb 1.6 oz)   Height:        Intake/Output Summary (Last 24 hours) at 01/24/17 1427 Last data filed at 01/24/17 0830  Gross per 24 hour  Intake              963 ml  Output              200 ml  Net              763 ml   Filed Weights   01/22/17 1400 01/23/17 0407 01/24/17 0618  Weight: 41 kg (90 lb 4.8 oz) 41.3 kg (91 lb 1.6 oz) 42.7 kg (94 lb 1.6 oz)    General appearance: alert, cooperative, appears stated age and no distress Resp: Good air entry bilaterally. Crackles noted bilateral bases. No wheezing. Cardio: regular rate and rhythm, S1, S2 normal, no murmur, click, rub or gallop GI: Abdomen is soft. Scar from previous surgery noted. Hepatomegaly appreciated on examination. Mildly tender in the upper abdomen. Extremities: extremities normal, atraumatic, no cyanosis or edema Neurologic: Awake, alert, somewhat distracted at times. No focal neurological deficits.  Lab Results:  Data Reviewed: I have personally reviewed following labs and imaging studies  CBC:  Recent Labs Lab 01/22/17 0004 01/22/17 0335 01/23/17 0343  WBC 6.6 7.9 6.3  HGB 12.5 13.1 11.3*  HCT 41.0 42.3 36.4  MCV 81.7 80.9 80.7  PLT 184 164 938    Basic Metabolic Panel:  Recent Labs Lab 01/22/17 0004 01/22/17 0335 01/23/17 0343  NA 140 138 137  K 3.7 5.6* 3.7  CL 102 102 95*  CO2 24 24 33*  GLUCOSE 152* 95 106*  BUN 33* 33* 32*  CREATININE 1.82* 1.70* 1.56*  CALCIUM 9.3 9.4 9.2    GFR: Estimated Creatinine Clearance: 26.2 mL/min (A) (by C-G formula based on SCr of 1.56 mg/dL (H)).  Liver Function Tests:  Recent Labs Lab 01/22/17 0335  AST 63*  ALT 27  ALKPHOS 91  BILITOT 0.8  PROT 7.1  ALBUMIN 4.0    Cardiac Enzymes:  Recent Labs Lab 01/22/17 0338 01/22/17 0939 01/22/17 1407  TROPONINI  0.04* 0.04* 0.04*    BNP (last 3 results)  Recent Labs  10/11/16 1033  PROBNP 8,862*    HbA1C:  Recent Labs  01/22/17 1407  HGBA1C 6.2*    Thyroid Function Tests:  Recent Labs  01/22/17 1407  TSH 1.947    Radiology Studies: Dg Chest 1 View  Result Date: 01/22/2017 CLINICAL DATA:  Shortness of breath, pleural effusion, CHF EXAM: CHEST 1 VIEW COMPARISON:  01/21/2017 FINDINGS: Enlargement of cardiac silhouette with pulmonary vascular congestion. Diffuse BILATERAL pulmonary infiltrates likely pulmonary edema, slightly improved. Loculated small RIGHT pleural effusion and tiny LEFT pleural effusion. No pneumothorax or acute osseous findings. Nonunion of an old distal RIGHT clavicular fracture. IMPRESSION: CHF slightly improved since previous exam. Electronically Signed   By: Lavonia Dana M.D.   On: 01/22/2017 17:18   Nm Pulmonary Perf And Vent  Result Date: 01/22/2017 CLINICAL DATA:  Elevated D-dimer.  Short of breath. EXAM: NUCLEAR MEDICINE VENTILATION - PERFUSION LUNG SCAN TECHNIQUE: Ventilation images were obtained in multiple projections using inhaled aerosol Tc-43m DTPA. Perfusion images were obtained in multiple projections after intravenous injection of Tc-52m MAA. RADIOPHARMACEUTICALS:  32.6 mCi Technetium-70m DTPA aerosol inhalation and 4.2 mCi Technetium-70m MAA IV COMPARISON:  Chest radiograph 01/22/2017 FINDINGS: Ventilation: Multiple small peripheral ventilation defects. Poor ventilation in the upper lobes. Perfusion: Multiple small peripheral perfusion defects matches the ventilation defects . No large wedge-shaped peripheral perfusion defects. IMPRESSION: 1. No evidence of acute pulmonary embolism. 2. Matched small peripheral ventilation and perfusion defects most suggestive of COPD. Electronically Signed   By: Suzy Bouchard M.D.   On: 01/22/2017 17:20     Medications:  Scheduled: . amiodarone  200 mg Oral Daily  . aspirin EC  325 mg Oral Daily  . cholecalciferol   2,000 Units Oral Daily  . donepezil  10 mg Oral QHS  . feeding supplement (ENSURE ENLIVE)  237 mL Oral BID BM  . heparin  5,000 Units Subcutaneous Q8H  . iopamidol      . lamoTRIgine  100 mg Oral BID  . latanoprost  1 drop Both Eyes QHS  . levETIRAcetam  750 mg Oral BID  . lipase/protease/amylase  24,000 Units Oral TID WC  . memantine  5 mg Oral BID  . metoprolol tartrate  25 mg Oral BID  . QUEtiapine  12.5 mg Oral QPM  . sodium chloride flush  3 mL Intravenous Q12H  . sodium chloride flush  3 mL Intravenous Q12H   Continuous: . sodium chloride     BOF:BPZWCH chloride, acetaminophen **OR** acetaminophen, albuterol, sodium chloride flush  Assessment/Plan:  Active Problems:   Dementia   CKD (chronic kidney disease) stage 3, GFR 30-59 ml/min   Seizures (HCC)   CHF (congestive heart failure) ([place])   Acute  renal failure (ARF) (HCC)   Atrial fibrillation with RVR (H[facility])    Acute hypoxic respiratory failure. Secondary to acute pulmonary edema. Patient was initially on BiPAP. Has not required in the last 24 hours. Stable. Treating CHF. Patient did have an episode of fever and so she was started on Levaquin. She was given only 1 dose due to concern for QT prolongation and considering she is on multiple QT prolonging medications. WBC is normal. We'll continue to watch without antibiotics for now. Repeat chest x-ray tomorrow.  Acute on chronic diastolic CHF with elevated troponin, likely secondary to demand ischemia.  Patient was on diuretics. However, due to low blood pressures. These were held. Cardiology to weigh in today. Strict ins and outs. Daily weights.  History of atrial flutter/atrial fibrillation with RVR. Cardiology managing. Currently on beta blocker. Hypotension is limiting use of medications. Currently on amiodarone. Not on anticoagulation due to history of falls and patient refusal as per previous notes.   Acute kidney injury superimposed on chronic kidney disease, stasis  3 Creatinine has improved. Continue to monitor urine output.  History of seizure disorder. Continue Keppra.  Weight loss. Etiology unclear. She appears to have enlarged liver on examination. Mild tenderness in the epigastric area. Patient thinks that she has had a colonoscopy within the last few years. We will proceed with CT scan of the abdomen and pelvis. If this does not reveal any clear-cut etiology, then further management as outpatient.  Severe protein calorie malnutrition/underweight BMI is 15.8. Nutrition is following.  History of dementia. Continue home medications. Stable.  DVT Prophylaxis: Subcutaneous heparin    Code Status: Full code  Family Communication: Discussed with the patient  Disposition Plan: Management as outlined above    LOS: 2 days   [facility] Pager [phone] 01/24/2017, 2:27 PM  If 7PM-7AM, please contact night-coverage at [email or web address], password [facility]

## 2017-01-24 NOTE — Progress Notes (Signed)
Transport here to get patient for CT scan.

## 2017-01-25 ENCOUNTER — Inpatient Hospital Stay (HOSPITAL_COMMUNITY): Payer: Medicare (Managed Care)

## 2017-01-25 LAB — COMPREHENSIVE METABOLIC PANEL
ALK PHOS: 75 U/L (ref 38–126)
ALT: 24 U/L (ref 14–54)
AST: 41 U/L (ref 15–41)
Albumin: 2.9 g/dL — ABNORMAL LOW (ref 3.5–5.0)
Anion gap: 10 (ref 5–15)
BUN: 29 mg/dL — AB (ref 6–20)
CALCIUM: 8.9 mg/dL (ref 8.9–10.3)
CO2: 26 mmol/L (ref 22–32)
CREATININE: 1.56 mg/dL — AB (ref 0.44–1.00)
Chloride: 98 mmol/L — ABNORMAL LOW (ref 101–111)
GFR, EST AFRICAN AMERICAN: 41 mL/min — AB (ref >60)
GFR, EST NON AFRICAN AMERICAN: 35 mL/min — AB (ref >60)
Glucose, Bld: 140 mg/dL — ABNORMAL HIGH (ref 65–99)
Potassium: 4.5 mmol/L (ref 3.5–5.1)
Sodium: 134 mmol/L — ABNORMAL LOW (ref 135–145)
Total Bilirubin: 0.5 mg/dL (ref 0.3–1.2)
Total Protein: 6.1 g/dL — ABNORMAL LOW (ref 6.5–8.1)

## 2017-01-25 LAB — URINALYSIS, ROUTINE W REFLEX MICROSCOPIC
BILIRUBIN URINE: NEGATIVE
Glucose, UA: NEGATIVE mg/dL
HGB URINE DIPSTICK: NEGATIVE
Ketones, ur: NEGATIVE mg/dL
Leukocytes, UA: NEGATIVE
Nitrite: NEGATIVE
PH: 7 (ref 5.0–8.0)
Protein, ur: NEGATIVE mg/dL
SPECIFIC GRAVITY, URINE: 1.014 (ref 1.005–1.030)

## 2017-01-25 LAB — CBC
HEMATOCRIT: 32.5 % — AB (ref 36.0–46.0)
HEMOGLOBIN: 10 g/dL — AB (ref 12.0–15.0)
MCH: 24.6 pg — AB (ref 26.0–34.0)
MCHC: 30.8 g/dL (ref 30.0–36.0)
MCV: 79.9 fL (ref 78.0–100.0)
Platelets: 194 10*3/uL (ref 150–400)
RBC: 4.07 MIL/uL (ref 3.87–5.11)
RDW: 17.6 % — ABNORMAL HIGH (ref 11.5–15.5)
WBC: 4.7 10*3/uL (ref 4.0–10.5)

## 2017-01-25 LAB — LIPID PANEL
Cholesterol: 76 mg/dL (ref 0–200)
HDL: 46 mg/dL (ref >40)
LDL CALC: 19 mg/dL (ref 0–99)
Total CHOL/HDL Ratio: 1.7 RATIO
Triglycerides: 56 mg/dL (ref <150)
VLDL: 11 mg/dL (ref 0–40)

## 2017-01-25 MED ORDER — FUROSEMIDE 40 MG PO TABS
40.0000 mg | ORAL_TABLET | Freq: Every day | ORAL | Status: DC
  Administered 2017-01-25 – 2017-01-26 (×2): 40 mg via ORAL
  Filled 2017-01-25 (×2): qty 1

## 2017-01-25 NOTE — Progress Notes (Signed)
Patient w/o complaint during 7 a to 7 p shift, BP remains somewhat soft but no medication held this shift.  Heart rate in the 110's to 120's prior to receiving Amiadorone this a.m. Back down to 80's and 90's after receiving morning meds.  [facility] nurse in to visit patient today, states patient has good support system in her son who is very knowledgeable regarding her medications and condition in addition to going to [facility] 5 days a week.  No complaints of shortness of breath this shift, 98-100% on room air.  Does still complain of IV being in foot however patient difficult stick with IV team previously being unable to find another site.

## 2017-01-25 NOTE — Progress Notes (Signed)
O2 saturation 97-99% on room air.  Will continue to monitor.

## 2017-01-25 NOTE — Plan of Care (Signed)
Problem: Cardiac: Goal: Ability to achieve and maintain adequate cardiopulmonary perfusion will improve Outcome: Progressing Patients oxygen remains 98-100% on room air   Problem: Education: Goal: Knowledge of disease or condition will improve Outcome: 45 Son very knowledgeable regarding disease state and medications Goal: Understanding of medication regimen will improve Outcome: Progressing Lives with son and has assistance from [facility]   Problem: Physical Regulation: Goal: Ability to maintain clinical measurements within normal limits will improve Outcome: Progressing Heart rate improved with medications, BP soft but holding in the 90's over 60's   Problem: Nutrition: Goal: Adequate nutrition will be maintained Outcome: Progressing Drinking Ensure between meals

## 2017-01-25 NOTE — Progress Notes (Signed)
Progress Note  Patient Name: Brittany [name] Date of Encounter: 01/25/2017  Primary Cardiologist: Dr. Tamala Julian   Subjective   No complaints. Sitting on side of bed and just finished breakfast. Good appetite. Plate is clean. She denies dyspnea. No palpitations with her arrhthymia. She is ready to go home.    Inpatient Medications    Scheduled Meds: . amiodarone  200 mg Oral Daily  . aspirin EC  325 mg Oral Daily  . cholecalciferol  2,000 Units Oral Daily  . donepezil  10 mg Oral QHS  . feeding supplement (ENSURE ENLIVE)  237 mL Oral BID BM  . heparin  5,000 Units Subcutaneous Q8H  . lamoTRIgine  100 mg Oral BID  . latanoprost  1 drop Both Eyes QHS  . levETIRAcetam  750 mg Oral BID  . lipase/protease/amylase  24,000 Units Oral TID WC  . memantine  5 mg Oral BID  . metoprolol tartrate  25 mg Oral BID  . QUEtiapine  12.5 mg Oral QPM  . sodium chloride flush  3 mL Intravenous Q12H  . sodium chloride flush  3 mL Intravenous Q12H   Continuous Infusions: . sodium chloride     PRN Meds: sodium chloride, acetaminophen **OR** acetaminophen, albuterol, sodium chloride flush   Vital Signs    Vitals:   01/24/17 1146 01/24/17 2147 01/24/17 2218 01/25/17 0420  BP: (!) 86/65 (!) 88/65 92/67 104/74  Pulse: (!) 50 (!) 123  83  Resp: 18 18  18   Temp: 97.9 F (36.6 C) 97.6 F (36.4 C)  98.1 F (36.7 C)  TempSrc: Oral Oral  Oral  SpO2: 100% 95%  99%  Weight:    97 lb 3.2 oz (44.1 kg)  Height:        Intake/Output Summary (Last 24 hours) at 01/25/17 0822 Last data filed at 01/25/17 0551  Gross per 24 hour  Intake             1323 ml  Output              450 ml  Net              873 ml   Filed Weights   01/23/17 0407 01/24/17 0618 01/25/17 0420  Weight: 91 lb 1.6 oz (41.3 kg) 94 lb 1.6 oz (42.7 kg) 97 lb 3.2 oz (44.1 kg)    Telemetry    Atrial flutter w/ RVR 117 bpm  - Personally Reviewed  ECG    Ectopic atrial tachycardia - Personally Reviewed  Physical Exam   GEN:  No acute distress. Frail, cachexia  Neck: No JVD Cardiac: irregular rthythm, tachy rate Respiratory: Clear to auscultation bilaterally. GI: Soft, nontender, non-distended  MS: No edema; No deformity. Neuro:  Nonfocal  Psych: Normal affect   Labs    Chemistry Recent Labs Lab 01/22/17 0004 01/22/17 0335 01/23/17 0343  NA 140 138 137  K 3.7 5.6* 3.7  CL 102 102 95*  CO2 24 24 33*  GLUCOSE 152* 95 106*  BUN 33* 33* 32*  CREATININE 1.82* 1.70* 1.56*  CALCIUM 9.3 9.4 9.2  PROT  --  7.1  --   ALBUMIN  --  4.0  --   AST  --  63*  --   ALT  --  27  --   ALKPHOS  --  91  --   BILITOT  --  0.8  --   GFRNONAA 29* 32* 35*  GFRAA 34* 37* 41*  ANIONGAP 14 12 9  Hematology Recent Labs Lab 01/22/17 0004 01/22/17 0335 01/23/17 0343  WBC 6.6 7.9 6.3  RBC 5.02 5.23* 4.51  HGB 12.5 13.1 11.3*  HCT 41.0 42.3 36.4  MCV 81.7 80.9 80.7  MCH 24.9* 25.0* 25.1*  MCHC 30.5 31.0 31.0  RDW 18.4* 18.1* 18.2*  PLT 184 164 180    Cardiac Enzymes Recent Labs Lab 01/22/17 0338 01/22/17 0939 01/22/17 1407  TROPONINI 0.04* 0.04* 0.04*    Recent Labs Lab 01/22/17 0008  TROPIPOC 0.07     BNP Recent Labs Lab 01/22/17 0004 01/22/17 0335  BNP 1,761.8* 1,507.3*     DDimer  Recent Labs Lab 01/22/17 0338  DDIMER 1.41*     Radiology    Ct Abdomen Pelvis Wo Contrast  Result Date: 01/24/2017 CLINICAL DATA:  61 year old female with weight loss and hepatomegaly. History of hypertension and hepatitis. EXAM: CT ABDOMEN AND PELVIS WITHOUT CONTRAST TECHNIQUE: Multidetector CT imaging of the abdomen and pelvis was performed following the standard protocol without IV contrast. COMPARISON:  Abdominal ultrasound dated 01/22/2017 FINDINGS: Evaluation of this exam is limited in the absence of intravenous contrast. Lower chest: There are small bilateral pleural effusions. Bibasilar linear atelectasis/scarring noted. There is diffuse interstitial prominence consistent with pulmonary edema.  Superimposed pneumonia is not excluded. Clinical correlation is recommended. There is mild cardiomegaly. The heart is only partially visualized. A small pericardial effusion is suspected. There is no intra-abdominal free air or free fluid. Hepatobiliary: There is enlargement of the left lobe of the liver. No definite surface irregularity. The liver is otherwise unremarkable on this noncontrast CT. No intrahepatic biliary ductal dilatation. The gallbladder is predominantly contracted. No calcified gallstone. Pancreas: The pancreas is atrophic. There are coarse calcification of the pancreas most consistent with sequela of chronic pancreatitis. No dilatation of the main pancreatic duct. Spleen: Normal in size without focal abnormality. Adrenals/Urinary Tract: The adrenal glands are unremarkable. There is no hydronephrosis or nephrolithiasis. Mild perinephric stranding, nonspecific. Correlation with urinalysis recommended to exclude UTI. The urinary bladder is grossly unremarkable for the degree of distension. Stomach/Bowel: The stomach is distended with oral content. There is moderate stool in the distal colon. There is contrast in the proximal colon. There is no evidence of bowel obstruction or active inflammation. Normal appendix. Vascular/Lymphatic: There is moderate aortoiliac atherosclerotic disease. The abdominal aorta and IVC are grossly unremarkable on this noncontrast study. No portal venous gas identified. There is no adenopathy. Reproductive: The uterus is grossly unremarkable.  No pelvic masses. Other: There is diffuse subcutaneous edema and anasarca. There is loss of subcutaneous and intraperitoneal fat and cachexia. No fluid collection. Musculoskeletal: Left femoral orthopedic hardware and heterotopic bone formation. The visualized portion of the orthopedic hardware appears intact. The bones are osteopenic. No acute fracture. IMPRESSION: 1. No definite acute intra-abdominopelvic pathology identified. Mild  distention of the stomach and moderate colonic stool burden. No bowel obstruction or active inflammation. Normal appendix. 2. Enlargement of the left lobe of the liver. No of these surface irregularity. Clinical correlation is recommended. 3. Coarse calcification of the pancreas sequela of chronic pancreatitis. No evidence of active inflammation. 4. Loss of subcutaneous fat and cachexia. 5. Diffuse subcutaneous edema and anasarca. 6.  Aortic Atherosclerosis (ICD10-I70.0). 7. Cardiomegaly with evidence of pulmonary edema and small bilateral pleural effusions. Pneumonia is not excluded. Clinical correlation is recommended. Electronically Signed   By: Anner Crete M.D.   On: 01/24/2017 22:37    Cardiac Studies   2D echo 01/22/17 Study Conclusions - Left ventricle: The cavity  size was normal. Wall thickness was increased in a pattern of mild LVH. Systolic function was normal. The estimated ejection fraction was in the range of 50% to 55%. There is hypokinesis of the apical myocardium. The study is not technically sufficient to allow evaluation of LV diastolic function. - Mitral valve: Calcified annulus. There was mild regurgitation. - Left atrium: The atrium was severely dilated. - Right atrium: The atrium was severely dilated. - Pulmonary arteries: Systolic pressure was mildly to moderately increased. PA peak pressure: 47 mm Hg (S). Impressions: - Apical hypokinesis with overall low normal LV systolic function; mild LVH; mild MR; severe biatrial enlargement; mild TR with mild to moderate elevation in pulmonary pressure.  Patient Profile     61 y.o.femalewith chronic diastolic heart failure, persistent atrial fibrillation/flutter, prior h/o CVA, however no longer on anticoagulation due to frequent falls, dementia and chronic alcoholism. Also h/o COPD, seizure, hypertension, PAD, CKD stage III, and pancreatitis admitted for worsening dyspnea, found to have rapid HR and  recurrent CHF - has had 2 prior admissions this year for [facility] in setting of dietary indiscretion with sodium.   Assessment & Plan    1. Acute on Chronic Diastolic CHF: echo 0/8/14 showed normal LVEF at 50-55%. Admit BNP markedly abnormal at 1,507. Initially treated with lasix, however this was on hold for the last 1-2 days given soft BP/ hypotension. CT of abdomen 01/24/17 showed cardiomegaly with evidence of pulmonary edema and small bilateral pleural effusions, as well as diffuse subcutaneous edema and anasarca. She does not appear however grossly volume overloaded on exam today. Breathing stable. Lungs CTAB. No peripheral edema. Systolic BP is in the low 100s this morning. Will attempt to restart PO lasix today and monitor BP closely.  Low salt diet. Tract weights and renal function. Use of BB has also been limited by soft BP.    2. Persistent Atrial Fibrillation/Flutter w/ RVR: Rate still elevated in the 110s resting, however pt is asymptomatic. It has been documented that patient has refused anticoagulation. Also high bleed risk due to falls, fragility, dementia and h/o chronic alcoholism. Amiodarone has been used for rate control, given issues with soft BP. Metoprolol also ordered. However given soft BP, dose has been reduced this admission, and at times held due to hypotension. Given limited options, rate control will continue to be a challenge. Given she is not on a/c, she is not a candidate for DCCV. Will continue low dose PO amiodarone for rate control +/- BB, if BP allows. Continue to monitor.     3. Abnormal Troponin: flat low level trend, 0.04 x 3 not c/w ACS. Likely demand ischemia from acute CHF and afib. she denies CP. No ischemic c/u indicated at this time.   4. Acute on CKD: SCr improved from admit to 1.82>>1.70>>1.56 (baseline ~1.5-1.6). However no BMP since 7/10. Recheck BMP tomorrow once PO Lasix is resumed.   5. Abdominal Tenderness/Weight Loss/ Hepatomegaly: Abdominal/Pelvic CT  yesterday showed no definite acute intra-abdominopelvic pathology.  Signed, Lyda Jester, PA-C  01/25/2017, 8:22 AM    Personally seen and examined. Agree with above.  Feels well Added back po lasix AFlutter is permanent and moderately controlled with Amio and metoprolol. Continue I would not be opposed to DC. Seems appropriate.   Candee Furbish, MD

## 2017-01-25 NOTE — Progress Notes (Signed)
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    PT Comments    Patient tolerated gait and stair training this session . Pt with antalgic gait due to pain in L foot from IV. Pt will likely still need a RW but hard to tell for sure until IV is out. Continue to progress as tolerated.    Follow Up Recommendations  Other (comment) (PACE)     Equipment Recommendations  Other (comment) (to be determined once IV is out)    Recommendations for Other Services       Precautions / Restrictions Precautions Precautions: Fall    Mobility  Bed Mobility Overal bed mobility: Needs Assistance Bed Mobility: Supine to Sit     Supine to sit: Supervision     General bed mobility comments: Supervision for safety  Transfers Overall transfer level: Needs assistance Equipment used: 1 person hand held assist Transfers: Sit to/from Stand Sit to Stand: Min guard         General transfer comment: min guard for safety  Ambulation/Gait Ambulation/Gait assistance: Min assist Ambulation Distance (Feet): 180 Feet Assistive device: Rolling walker (2 wheeled) Gait Pattern/deviations: Decreased stance time - left;Decreased step length - right;Antalgic     General Gait Details: antalgic gait due to pain from IV in L foot   Stairs Stairs: Yes   Stair Management: Two rails;Alternating pattern;Forwards Number of Stairs: 10 General stair comments: cues for sequencing due to L foot pain; effortful but no physical assist needed  Wheelchair  Mobility    Modified Rankin (Stroke Patients Only)       Balance Overall balance assessment: Needs assistance   Sitting balance-Leahy Scale: Good     Standing balance support: Single extremity supported Standing balance-Leahy Scale: Poor                              Cognition Arousal/Alertness: Awake/alert Behavior During Therapy: Restless Overall Cognitive Status: History of cognitive impairments - at baseline                                 General Comments: History of dementia; unreliable historian; mood and social responses varied wildly within session      Exercises      General Comments        Pertinent Vitals/Pain Pain Assessment: Faces Faces Pain Scale: Hurts little more Pain Location: L foot with weight bearing due to IV in foot Pain Descriptors / Indicators: Discomfort;Guarding;Sore (does not put weight on foot) Pain Intervention(s): Monitored during session;Repositioned    Home Living                      Prior Function            PT Goals (current goals can now be found in the care plan section) Acute Rehab PT Goals Patient Stated Goal: did not state PT Goal Formulation: Patient unable to participate in goal setting Time For Goal Achievement: 02/07/17 Potential to Achieve Goals: Fair Progress  towards PT goals: Progressing toward goals    Frequency    Min 3X/week      PT Plan Current plan remains appropriate    Co-evaluation              AM-PAC PT "6 Clicks" Daily Activity  Outcome Measure  Difficulty turning over in bed (including adjusting bedclothes, sheets and blankets)?: None Difficulty moving from lying on back to sitting on the side of the bed? : None Difficulty sitting down on and standing up from a chair with arms (e.g., wheelchair, bedside commode, etc,.)?: A Little Help needed moving to and from a bed to chair (including a wheelchair)?: A Little Help needed walking in hospital  room?: A Little Help needed climbing 3-5 steps with a railing? : A Little 6 Click Score: 20    End of Session Equipment Utilized During Treatment: Gait belt Activity Tolerance: Patient tolerated treatment well Patient left: with call bell/phone within reach;in bed;with bed alarm set Nurse Communication: Mobility status PT Visit Diagnosis: Difficulty in walking, not elsewhere classified (R26.2)     Time: 7672-0947 PT Time Calculation (min) (ACUTE ONLY): 30 min  Charges:  $Gait Training: 23-37 mins                    G Codes:       Earney Navy, PTA Pager: [phone]     Darliss Cheney 01/25/2017, 1:34 PM

## 2017-01-25 NOTE — Progress Notes (Signed)
[facility] PROGRESS NOTE  Brittany [name] GGE:[id number] DOB: [birth year] DOA: 01/21/2017  PCP: System, Pcp Not In  Brief History/Interval Summary: [name] with a past medical history of diastolic CHF, atrial flutter, not thought to be a candidate for anticoagulation due to history of recurrent falls, chronic kidney disease stage III, dementia, seizures, presented with several day history of progressively worsening shortness of breath. Patient was found to be in rapid atrial fibrillation and was noted to have acute pulmonary edema. She was hospitalized for further management.  Reason for Visit: Acute diastolic CHF. Atrial flutter.  Consultants: Cardiology  Procedures:  Transthoracic echocardiogram Study Conclusions  - Left ventricle: The cavity size was normal. Wall thickness was   increased in a pattern of mild LVH. Systolic function was normal.   The estimated ejection fraction was in the range of 50% to 55%.   There is hypokinesis of the apical myocardium. The study is not   technically sufficient to allow evaluation of LV diastolic   function. - Mitral valve: Calcified annulus. There was mild regurgitation. - Left atrium: The atrium was severely dilated. - Right atrium: The atrium was severely dilated. - Pulmonary arteries: Systolic pressure was mildly to moderately   increased. PA peak pressure: 47 mm Hg (S).  Impressions:  - Apical hypokinesis with overall low normal LV systolic function;   mild LVH; mild MR; severe biatrial enlargement; mild TR with mild   to moderate elevation in pulmonary pressure.  Antibiotics: Levaquin  Subjective/Interval History: Patient denies any complaints this morning. Noted to be distracted as before. She has a history of dementia.  ROS: Denies any chest pain or shortness of breath.  Objective:  Vital Signs  Vitals:   01/24/17 1146 01/24/17 2147 01/24/17 2218 01/25/17 0420  BP: (!) 86/65 (!) 88/65  92/67 104/74  Pulse: (!) 50 (!) 123  83  Resp: 18 18  18   Temp: 97.9 F (36.6 C) 97.6 F (36.4 C)  98.1 F (36.7 C)  TempSrc: Oral Oral  Oral  SpO2: 100% 95%  99%  Weight:    44.1 kg (97 lb 3.2 oz)  Height:        Intake/Output Summary (Last 24 hours) at 01/25/17 0823 Last data filed at 01/25/17 0551  Gross per 24 hour  Intake             1323 ml  Output              450 ml  Net              873 ml   Filed Weights   01/23/17 0407 01/24/17 0618 01/25/17 0420  Weight: 41.3 kg (91 lb 1.6 oz) 42.7 kg (94 lb 1.6 oz) 44.1 kg (97 lb 3.2 oz)    General appearance: Awake, distracted. No distress Resp: Few crackles noted at the bases. No wheezing. Cardio: S1, S2 is irregularly irregular. No S3, S4. No rubs, murmurs or bruit GI: Abdomen is soft. Scar from previous surgery is noted. Hepatomegaly is appreciated. Nontender. Extremities: No significant edema Neurologic: Awake and alert. Somewhat distracted at times. No focal neurological deficits.  Lab Results:  Data Reviewed: I have personally reviewed following labs and imaging studies  CBC:  Recent Labs Lab 01/22/17 0004 01/22/17 0335 01/23/17 0343  WBC 6.6 7.9 6.3  HGB 12.5 13.1 11.3*  HCT 41.0 42.3 36.4  MCV 81.7 80.9 80.7  PLT 184 164 465    Basic Metabolic Panel:  Recent Labs Lab 01/22/17  0004 01/22/17 0335 01/23/17 0343  NA 140 138 137  K 3.7 5.6* 3.7  CL 102 102 95*  CO2 24 24 33*  GLUCOSE 152* 95 106*  BUN 33* 33* 32*  CREATININE 1.82* 1.70* 1.56*  CALCIUM 9.3 9.4 9.2    GFR: Estimated Creatinine Clearance: 27 mL/min (A) (by C-G formula based on SCr of 1.56 mg/dL (H)).  Liver Function Tests:  Recent Labs Lab 01/22/17 0335  AST 63*  ALT 27  ALKPHOS 91  BILITOT 0.8  PROT 7.1  ALBUMIN 4.0    Cardiac Enzymes:  Recent Labs Lab 01/22/17 0338 01/22/17 0939 01/22/17 1407  TROPONINI 0.04* 0.04* 0.04*    BNP (last 3 results)  Recent Labs  10/11/16 1033  PROBNP 8,862*     HbA1C:  Recent Labs  01/22/17 1407  HGBA1C 6.2*    Thyroid Function Tests:  Recent Labs  01/22/17 1407  TSH 1.947    Radiology Studies: Ct Abdomen Pelvis Wo Contrast  Result Date: 01/24/2017 CLINICAL DATA:  61 year old female with weight loss and hepatomegaly. History of hypertension and hepatitis. EXAM: CT ABDOMEN AND PELVIS WITHOUT CONTRAST TECHNIQUE: Multidetector CT imaging of the abdomen and pelvis was performed following the standard protocol without IV contrast. COMPARISON:  Abdominal ultrasound dated 01/22/2017 FINDINGS: Evaluation of this exam is limited in the absence of intravenous contrast. Lower chest: There are small bilateral pleural effusions. Bibasilar linear atelectasis/scarring noted. There is diffuse interstitial prominence consistent with pulmonary edema. Superimposed pneumonia is not excluded. Clinical correlation is recommended. There is mild cardiomegaly. The heart is only partially visualized. A small pericardial effusion is suspected. There is no intra-abdominal free air or free fluid. Hepatobiliary: There is enlargement of the left lobe of the liver. No definite surface irregularity. The liver is otherwise unremarkable on this noncontrast CT. No intrahepatic biliary ductal dilatation. The gallbladder is predominantly contracted. No calcified gallstone. Pancreas: The pancreas is atrophic. There are coarse calcification of the pancreas most consistent with sequela of chronic pancreatitis. No dilatation of the main pancreatic duct. Spleen: Normal in size without focal abnormality. Adrenals/Urinary Tract: The adrenal glands are unremarkable. There is no hydronephrosis or nephrolithiasis. Mild perinephric stranding, nonspecific. Correlation with urinalysis recommended to exclude UTI. The urinary bladder is grossly unremarkable for the degree of distension. Stomach/Bowel: The stomach is distended with oral content. There is moderate stool in the distal colon. There is  contrast in the proximal colon. There is no evidence of bowel obstruction or active inflammation. Normal appendix. Vascular/Lymphatic: There is moderate aortoiliac atherosclerotic disease. The abdominal aorta and IVC are grossly unremarkable on this noncontrast study. No portal venous gas identified. There is no adenopathy. Reproductive: The uterus is grossly unremarkable.  No pelvic masses. Other: There is diffuse subcutaneous edema and anasarca. There is loss of subcutaneous and intraperitoneal fat and cachexia. No fluid collection. Musculoskeletal: Left femoral orthopedic hardware and heterotopic bone formation. The visualized portion of the orthopedic hardware appears intact. The bones are osteopenic. No acute fracture. IMPRESSION: 1. No definite acute intra-abdominopelvic pathology identified. Mild distention of the stomach and moderate colonic stool burden. No bowel obstruction or active inflammation. Normal appendix. 2. Enlargement of the left lobe of the liver. No of these surface irregularity. Clinical correlation is recommended. 3. Coarse calcification of the pancreas sequela of chronic pancreatitis. No evidence of active inflammation. 4. Loss of subcutaneous fat and cachexia. 5. Diffuse subcutaneous edema and anasarca. 6.  Aortic Atherosclerosis (ICD10-I70.0). 7. Cardiomegaly with evidence of pulmonary edema and small bilateral pleural effusions.  Pneumonia is not excluded. Clinical correlation is recommended. Electronically Signed   By: Anner Crete M.D.   On: 01/24/2017 22:37     Medications:  Scheduled: . amiodarone  200 mg Oral Daily  . aspirin EC  325 mg Oral Daily  . cholecalciferol  2,000 Units Oral Daily  . donepezil  10 mg Oral QHS  . feeding supplement (ENSURE ENLIVE)  237 mL Oral BID BM  . heparin  5,000 Units Subcutaneous Q8H  . lamoTRIgine  100 mg Oral BID  . latanoprost  1 drop Both Eyes QHS  . levETIRAcetam  750 mg Oral BID  . lipase/protease/amylase  24,000 Units Oral TID  WC  . memantine  5 mg Oral BID  . metoprolol tartrate  25 mg Oral BID  . QUEtiapine  12.5 mg Oral QPM  . sodium chloride flush  3 mL Intravenous Q12H  . sodium chloride flush  3 mL Intravenous Q12H   Continuous: . sodium chloride     UQJ:FHLKTG chloride, acetaminophen **OR** acetaminophen, albuterol, sodium chloride flush  Assessment/Plan:  Active Problems:   Dementia   CKD (chronic kidney disease) stage 3, GFR 30-59 ml/min   Seizures (HCC)   CHF (congestive heart failure) (HCC)   Acute renal failure (ARF) (HCC)   Atrial fibrillation with RVR (HCC)    Acute hypoxic respiratory failure. Secondary to acute pulmonary edema. Patient was initially on BiPAP. Has been stable without it for 48 hours. She has improved. No clinical concern for pneumonia. Hold off on further doses of antibiotics. Chest x-ray shows improving aeration. To be started back on oral Lasix today. Blood pressure will be the limiting factor.   Acute on chronic diastolic CHF with elevated troponin, likely secondary to demand ischemia.  Patient was on diuretics. However, due to low blood pressures these were held. Weight has been recorded higher than yesterday. Plan is to resume oral Lasix today. She appears to be quite stable.   History of atrial flutter/atrial fibrillation with RVR. Cardiology managing. Currently on metoprolol and amiodarone. Heart rate seems to be better controlled today compared to yesterday. Not on anticoagulation due to history of falls and patient refusal as per previous notes.   Acute kidney injury superimposed on chronic kidney disease, stasis 3 Creatinine has improved. Continue to monitor urine output. And refused blood work this morning. She has been encouraged to allow [place] to draw blood.  History of seizure disorder. Continue Keppra. Stable.  Weight loss. CT scan of the abdomen and pelvis did not show any concerning abnormalities. Mild hepatomegaly noted. Findings suggestive of chronic  pancreatitis seen. Constipation was noted. Her weight loss could be due to multiple comorbidities and dementia. She is HIV nonreactive. Encourage oral intake. Further management to be pursued in the outpatient setting  Severe protein calorie malnutrition/underweight BMI is 15.8. Nutrition is following.  History of dementia. Continue home medications. Stable.  DVT Prophylaxis: Subcutaneous heparin    Code Status: Full code  Family Communication: Discussed with the patient  Disposition Plan: Await labs. Diuretics will be reinitiated. Anticipate discharge in the next 24 hours.    LOS: 3 days   [facility] Pager [phone] 01/25/2017, 8:23 AM  If 7PM-7AM, please contact night-coverage at [email or web address], password [email or web address]

## 2017-01-26 LAB — URINE CULTURE: Culture: NO GROWTH

## 2017-01-26 MED ORDER — FUROSEMIDE 40 MG PO TABS: 40.0000 mg | ORAL_TABLET | Freq: Every day | ORAL | 1 refills | Status: DC

## 2017-01-26 NOTE — Progress Notes (Signed)
Patient discharged for home via transport from family member.  All discharged instructions reviewed with patient and she stated understanding.

## 2017-01-26 NOTE — Care Management Important Message (Signed)
Important Message  Patient Details  Name: Brittany [name] MRN: [id number] Date of Birth: [birth year]   Medicare Important Message Given:  Yes    [name] 01/26/2017, 1:15 PM

## 2017-01-26 NOTE — Evaluation (Signed)
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   Clinical Impression   Patient evaluated by Occupational Therapy with no further acute OT needs identified. All education has been completed and the patient has no further questions. Pt requires supervision for ADLs and should progress quickly to mod I.  She has 24 hour assist at discharge.   See below for any follow-up Occupational Therapy or equipment needs. OT is signing off. Thank you for this referral.      Follow Up Recommendations  No OT follow up;Supervision/Assistance - 24 hour    Equipment Recommendations  None recommended by OT    Recommendations for Other Services       Precautions / Restrictions Precautions Precautions: Fall      Mobility Bed Mobility Overal bed mobility: Independent                Transfers Overall transfer level: Needs assistance Equipment used: 1 person hand held assist Transfers: Sit to/from Stand;Stand Pivot Transfers Sit to Stand: Supervision;Min guard         General transfer comment: min guard for safety    Balance Overall balance assessment: Needs assistance   Sitting balance-Leahy Scale: Good     Standing balance support: During functional activity Standing balance-Leahy Scale: Fair                             ADL either performed or assessed with clinical judgement   ADL Overall ADL's : Needs assistance/impaired Eating/Feeding: Independent   Grooming: Wash/dry hands;Wash/dry face;Oral care;Brushing  hair;Supervision/safety;Standing   Upper Body Bathing: Set up;Supervision/ safety;Standing   Lower Body Bathing: Supervison/ safety;Sit to/from stand   Upper Body Dressing : Set up;Sitting   Lower Body Dressing: Supervision/safety;Sit to/from stand   Toilet Transfer: Supervision/safety;Ambulation;BSC   Toileting- Water quality scientist and Hygiene: Supervision/safety;Sit to/from stand       Functional mobility during ADLs: Min guard (due to LE pain ) General ADL Comments: Pt furniture walks in room due to foot discomfort caused by IV      Vision         Perception     Praxis      Pertinent Vitals/Pain Pain Assessment: Faces Faces Pain Scale: Hurts little more Pain Location: L foot with weight bearing due to IV in foot Pain Descriptors / Indicators: Discomfort;Guarding;Sore Pain Intervention(s): Monitored during session     Hand Dominance Right   Extremity/Trunk Assessment Upper Extremity Assessment Upper Extremity Assessment: Overall WFL for tasks assessed   Lower Extremity Assessment Lower Extremity Assessment: Defer to PT evaluation   Cervical / Trunk Assessment Cervical / Trunk Assessment: Normal   Communication Communication Communication: No difficulties   Cognition Arousal/Alertness: Awake/alert Behavior During Therapy: WFL for tasks assessed/performed Overall Cognitive Status: History of cognitive impairments - at baseline                                     General Comments  Pt is eager to discharge home  Exercises     Shoulder Instructions      Home Living Family/patient expects to be discharged to:: Private residence Living Arrangements: Children Available Help at Discharge: Family;Available 24 hours/day Type of Home: House Home Access: Stairs to enter [employer] of Steps: 2 Entrance Stairs-Rails: Can reach both Home Layout: Two level Alternate Level Stairs-Number of Steps: flight   Bathroom Shower/Tub:  Teacher, early years/pre: Standard     Home Equipment: Environmental consultant - 2 wheels          Prior Functioning/Environment Level of Independence: Independent with assistive device(s)        Comments: Pt attends Adult day program 5x/week with [facility]         OT Problem List: Decreased activity tolerance      OT Treatment/Interventions:      OT Goals(Current goals can be found in the care plan section) Acute Rehab OT Goals Patient Stated Goal: to go home   OT Frequency:     Barriers to D/C:            Co-evaluation              AM-PAC PT "6 Clicks" Daily Activity     Outcome Measure Help from another person eating meals?: None Help from another person taking care of personal grooming?: A Little Help from another person toileting, which includes using toliet, bedpan, or urinal?: A Little Help from another person bathing (including washing, rinsing, drying)?: A Little Help from another person to put on and taking off regular upper body clothing?: A Little Help from another person to put on and taking off regular lower body clothing?: A Little 6 Click Score: 19   End of Session Nurse Communication: Mobility status  Activity Tolerance: Patient tolerated treatment well Patient left: in bed;with call bell/phone within reach  OT Visit Diagnosis: Unsteadiness on feet (R26.81)                Time: 4496-7591 OT Time Calculation (min): 24 min Charges:  OT General Charges $OT Visit: 1 Procedure OT Evaluation $OT Eval Low Complexity: 1 Procedure OT Treatments $Self Care/Home Management : 8-22 mins G-Codes:     [facility], OTR/L [phone]   Lucille Passy M 01/26/2017, 2:17 PM

## 2017-01-26 NOTE — Progress Notes (Signed)
Patient is for discharge home today; Patient is active with [facility]; Earnest Bailey SW with [facility] made aware; Mindi Slicker [facility] [phone]

## 2017-01-26 NOTE — Discharge Summary (Signed)
[facility]  Physician Discharge Summary   Patient ID: Brittany [name] MRN: [id number] DOB/AGE: July 22, 1955 61 y.o.  Admit date: 01/21/2017 Discharge date: 01/26/2017  PCP: System, Pcp Not In  DISCHARGE DIAGNOSES:  Active Problems:   Dementia   CKD (chronic kidney disease) stage 3, GFR 30-59 ml/min   Seizures (HCC)   CHF (congestive heart failure) (HCC)   Acute renal failure (ARF) (HCC)   Atrial fibrillation with RVR (HCC)   RECOMMENDATIONS FOR OUTPATIENT FOLLOW UP: 1. Cardiology to arrange outpatient follow-up 2. Patient to continue with [facility] program 3. Could consider further workup for weight loss if appropriate.   DISCHARGE CONDITION: fair  Diet recommendation: As before  Filed Weights   01/24/17 0618 01/25/17 0420 01/26/17 0443  Weight: 42.7 kg (94 lb 1.6 oz) 44.1 kg (97 lb 3.2 oz) 42.7 kg (94 lb 1.6 oz)    INITIAL HISTORY: [name] with a past medical history of diastolic CHF, atrial flutter, not thought to be a candidate for anticoagulation due to history of recurrent falls, chronic kidney disease stage III, dementia, seizures, presented with several day history of progressively worsening shortness of breath. Patient was found to be in rapid atrial fibrillation and was noted to have acute pulmonary edema. She was hospitalized for further management.  Consultants: Cardiology  Procedures:  Transthoracic echocardiogram Study Conclusions  - Left ventricle: The cavity size was normal. Wall thickness was increased in a pattern of mild LVH. Systolic function was normal. The estimated ejection fraction was in the range of 50% to 55%. There is hypokinesis of the apical myocardium. The study is not technically sufficient to allow evaluation of LV diastolic function. - Mitral valve: Calcified annulus. There was mild regurgitation. - Left atrium: The atrium was severely dilated. - Right atrium: The atrium was severely  dilated. - Pulmonary arteries: Systolic pressure was mildly to moderately increased. PA peak pressure: 47 mm Hg (S).  Impressions:  - Apical hypokinesis with overall low normal LV systolic function; mild LVH; mild MR; severe biatrial enlargement; mild TR with mild to moderate elevation in pulmonary pressure.   HOSPITAL COURSE:   Acute hypoxic respiratory failure. This was secondary to acute pulmonary edema. Patient was initially on BiPAP. She was slowly taken off of it. She clinically improved. No clinical concern for pneumonia. Antibiotics were not continued. She improved with diuresis. Chest x-ray was repeated which showed improved aeration. She is saturating normally on room air.  Acute on chronic diastolic CHF with elevated troponin, likely secondary to demand ischemia.  He was given IV Lasix. Low blood pressure prompted discontinuation. She was started back on low dose of oral Lasix. She has diuresed. She is feeling much better. Echocardiogram as above. Okay for discharge per cardiology.    History of atrial flutter/atrial fibrillation with RVR. Stable. Cardiology was consulted. Continued on metoprolol and amiodarone. Heart rate is better. Not on anticoagulation due to history of falls and patient refusal as per previous notes.   Acute kidney injury superimposed on chronic kidney disease, stasis 3 Creatinine has improved. Will need outpatient monitoring.  History of seizure disorder. Continue Keppra. Stable.  Weight loss. CT scan of the abdomen and pelvis did not show any concerning abnormalities. Mild hepatomegaly noted. Findings suggestive of chronic pancreatitis seen. Constipation was noted. Her weight loss could be due to multiple comorbidities and dementia. She is HIV nonreactive. TSH normal. Encourage oral intake. Further management to be pursued in the outpatient setting  Severe protein calorie malnutrition/underweight BMI is 15.8.  History of  dementia. Continue home medications. Stable.  Stable. Okay for discharge home today.   PERTINENT LABS:  The results of significant diagnostics from this hospitalization (including imaging, microbiology, ancillary and laboratory) are listed below for reference.    Microbiology: Recent Results (from the past 240 hour(s))  Urine Culture     Status: None   Collection Time: 01/25/17  4:32 AM  Result Value Ref Range Status   Specimen Description URINE, RANDOM  Final   Special Requests NONE  Final   Culture NO GROWTH  Final   Report Status 01/26/2017 FINAL  Final     Labs: Basic Metabolic Panel:  Recent Labs Lab 01/22/17 0004 01/22/17 0335 01/23/17 0343 01/25/17 1600  NA 140 138 137 134*  K 3.7 5.6* 3.7 4.5  CL 102 102 95* 98*  CO2 24 24 33* 26  GLUCOSE 152* 95 106* 140*  BUN 33* 33* 32* 29*  CREATININE 1.82* 1.70* 1.56* 1.56*  CALCIUM 9.3 9.4 9.2 8.9   Liver Function Tests:  Recent Labs Lab 01/22/17 0335 01/25/17 1600  AST 63* 41  ALT 27 24  ALKPHOS 91 75  BILITOT 0.8 0.5  PROT 7.1 6.1*  ALBUMIN 4.0 2.9*   CBC:  Recent Labs Lab 01/22/17 0004 01/22/17 0335 01/23/17 0343 01/25/17 1600  WBC 6.6 7.9 6.3 4.7  HGB 12.5 13.1 11.3* 10.0*  HCT 41.0 42.3 36.4 32.5*  MCV 81.7 80.9 80.7 79.9  PLT 184 164 180 194   Cardiac Enzymes:  Recent Labs Lab 01/22/17 0338 01/22/17 0939 01/22/17 1407  TROPONINI 0.04* 0.04* 0.04*   BNP: BNP (last 3 results)  Recent Labs  08/04/16 1245 01/22/17 0004 01/22/17 0335  BNP 1,367.2* 1,761.8* 1,507.3*    IMAGING STUDIES Ct Abdomen Pelvis Wo Contrast  Result Date: 01/24/2017 CLINICAL DATA:  61 year old female with weight loss and hepatomegaly. History of hypertension and hepatitis. EXAM: CT ABDOMEN AND PELVIS WITHOUT CONTRAST TECHNIQUE: Multidetector CT imaging of the abdomen and pelvis was performed following the standard protocol without IV contrast. COMPARISON:  Abdominal ultrasound dated 01/22/2017 FINDINGS:  Evaluation of this exam is limited in the absence of intravenous contrast. Lower chest: There are small bilateral pleural effusions. Bibasilar linear atelectasis/scarring noted. There is diffuse interstitial prominence consistent with pulmonary edema. Superimposed pneumonia is not excluded. Clinical correlation is recommended. There is mild cardiomegaly. The heart is only partially visualized. A small pericardial effusion is suspected. There is no intra-abdominal free air or free fluid. Hepatobiliary: There is enlargement of the left lobe of the liver. No definite surface irregularity. The liver is otherwise unremarkable on this noncontrast CT. No intrahepatic biliary ductal dilatation. The gallbladder is predominantly contracted. No calcified gallstone. Pancreas: The pancreas is atrophic. There are coarse calcification of the pancreas most consistent with sequela of chronic pancreatitis. No dilatation of the main pancreatic duct. Spleen: Normal in size without focal abnormality. Adrenals/Urinary Tract: The adrenal glands are unremarkable. There is no hydronephrosis or nephrolithiasis. Mild perinephric stranding, nonspecific. Correlation with urinalysis recommended to exclude UTI. The urinary bladder is grossly unremarkable for the degree of distension. Stomach/Bowel: The stomach is distended with oral content. There is moderate stool in the distal colon. There is contrast in the proximal colon. There is no evidence of bowel obstruction or active inflammation. Normal appendix. Vascular/Lymphatic: There is moderate aortoiliac atherosclerotic disease. The abdominal aorta and IVC are grossly unremarkable on this noncontrast study. No portal venous gas identified. There is no adenopathy. Reproductive: The uterus is grossly unremarkable.  No pelvic  masses. Other: There is diffuse subcutaneous edema and anasarca. There is loss of subcutaneous and intraperitoneal fat and cachexia. No fluid collection. Musculoskeletal: Left  femoral orthopedic hardware and heterotopic bone formation. The visualized portion of the orthopedic hardware appears intact. The bones are osteopenic. No acute fracture. IMPRESSION: 1. No definite acute intra-abdominopelvic pathology identified. Mild distention of the stomach and moderate colonic stool burden. No bowel obstruction or active inflammation. Normal appendix. 2. Enlargement of the left lobe of the liver. No of these surface irregularity. Clinical correlation is recommended. 3. Coarse calcification of the pancreas sequela of chronic pancreatitis. No evidence of active inflammation. 4. Loss of subcutaneous fat and cachexia. 5. Diffuse subcutaneous edema and anasarca. 6.  Aortic Atherosclerosis (ICD10-I70.0). 7. Cardiomegaly with evidence of pulmonary edema and small bilateral pleural effusions. Pneumonia is not excluded. Clinical correlation is recommended. Electronically Signed   By: Anner Crete M.D.   On: 01/24/2017 22:37   Dg Chest 1 View  Result Date: 01/22/2017 CLINICAL DATA:  Shortness of breath, pleural effusion, CHF EXAM: CHEST 1 VIEW COMPARISON:  01/21/2017 FINDINGS: Enlargement of cardiac silhouette with pulmonary vascular congestion. Diffuse BILATERAL pulmonary infiltrates likely pulmonary edema, slightly improved. Loculated small RIGHT pleural effusion and tiny LEFT pleural effusion. No pneumothorax or acute osseous findings. Nonunion of an old distal RIGHT clavicular fracture. IMPRESSION: CHF slightly improved since previous exam. Electronically Signed   By: Lavonia Dana M.D.   On: 01/22/2017 17:18   Dg Chest 2 View  Result Date: 01/25/2017 CLINICAL DATA:  CHF EXAM: CHEST  2 VIEW COMPARISON:  01/22/2017 FINDINGS: There is hyperinflation of the lungs compatible with COPD. Cardiomegaly. Improving airspace disease throughout the left lung with continued right lung airspace opacity and small effusions. Findings likely reflect improving edema/ CHF or infection. IMPRESSION: COPD.  Cardiomegaly. Improving airspace opacity vertically in the left lung. Residual right lower lung opacity and small effusions. Electronically Signed   By: Rolm Baptise M.D.   On: 01/25/2017 09:02   US Renal  Result Date: 01/22/2017 CLINICAL DATA:  Acute renal failure EXAM: RENAL / URINARY TRACT ULTRASOUND COMPLETE COMPARISON:  None. FINDINGS: Right Kidney: Length: 10.4 cm. Increased cortical echogenicity. No hydronephrosis. Left Kidney: Length: 9.2 cm. Increased cortical echogenicity. No hydronephrosis. Small cyst in the upper pole measuring 0.7 x 0.5 x 0.9 cm. Bladder: Appears normal for degree of bladder distention. IMPRESSION: 1. Increased cortical echogenicity consistent with medical renal disease. No hydronephrosis. 2. Small cyst in the upper pole of the left kidney Electronically Signed   By: Donavan Foil M.D.   On: 01/22/2017 03:06   Nm Pulmonary Perf And Vent  Result Date: 01/22/2017 CLINICAL DATA:  Elevated D-dimer.  Short of breath. EXAM: NUCLEAR MEDICINE VENTILATION - PERFUSION LUNG SCAN TECHNIQUE: Ventilation images were obtained in multiple projections using inhaled aerosol Tc-79m DTPA. Perfusion images were obtained in multiple projections after intravenous injection of Tc-55m MAA. RADIOPHARMACEUTICALS:  32.6 mCi Technetium-66m DTPA aerosol inhalation and 4.2 mCi Technetium-75m MAA IV COMPARISON:  Chest radiograph 01/22/2017 FINDINGS: Ventilation: Multiple small peripheral ventilation defects. Poor ventilation in the upper lobes. Perfusion: Multiple small peripheral perfusion defects matches the ventilation defects . No large wedge-shaped peripheral perfusion defects. IMPRESSION: 1. No evidence of acute pulmonary embolism. 2. Matched small peripheral ventilation and perfusion defects most suggestive of COPD. Electronically Signed   By: Suzy Bouchard M.D.   On: 01/22/2017 17:20   Dg Chest Portable 1 View  Result Date: 01/22/2017 CLINICAL DATA:  61 year old female with shortness of breath.  EXAM: PORTABLE CHEST 1 VIEW COMPARISON:  Chest radiograph dated 08/04/2016 FINDINGS: Bilateral large patchy airspace opacities primarily involving the mid to lower lung fields noted which may represent pneumonia, or pulmonary edema. If there is history of malignancy, and infiltrative process or lymphangitic spread of tumor is not excluded. There is a small right pleural effusion. No pneumothorax. The cardiac silhouette is enlarged. No acute osseous pathology. IMPRESSION: Cardiomegaly with large bilateral pulmonary infiltrates, likely pulmonary edema. Pneumonia is not excluded. Clinical correlation is recommended. There is a small right pleural effusion. Electronically Signed   By: Anner Crete M.D.   On: 01/22/2017 00:23    DISCHARGE EXAMINATION: Vitals:   01/26/17 0443 01/26/17 0450 01/26/17 0926 01/26/17 1200  BP:  102/74 90/67 (!) 84/65  Pulse:  (!) 59 92 80  Resp:  20  18  Temp:  98.2 F (36.8 C)  98.2 F (36.8 C)  TempSrc:  Oral  Oral  SpO2:  98%  100%  Weight: 42.7 kg (94 lb 1.6 oz)     Height:       General appearance: alert, distracted at times. cooperative, appears stated age and no distress Resp: Few crackles at the bases but mostly clear to auscultation Cardio: S1, S2 is irregularly irregular. No S3, S4. No rubs, murmurs or bruits GI: soft, non-tender; bowel sounds normal; no masses,  no organomegaly  DISPOSITION: Home with family  Discharge Instructions    Call MD for:  difficulty breathing, headache or visual disturbances    Complete by:  As directed    Call MD for:  extreme fatigue    Complete by:  As directed    Call MD for:  persistant dizziness or light-headedness    Complete by:  As directed    Call MD for:  persistant nausea and vomiting    Complete by:  As directed    Call MD for:  severe uncontrolled pain    Complete by:  As directed    Call MD for:  temperature >100.4    Complete by:  As directed    Discharge instructions    Complete by:  As directed     Follow-up with [facility]. Cardiology will arrange outpatient follow-up.  You were cared for by a hospitalist during your hospital stay. If you have any questions about your discharge medications or the care you received while you were in the hospital after you are discharged, you can call the unit and asked to speak with the hospitalist on call if the hospitalist that took care of you is not available. Once you are discharged, your primary care physician will handle any further medical issues. Please note that NO REFILLS for any discharge medications will be authorized once you are discharged, as it is imperative that you return to your primary care physician (or establish a relationship with a primary care physician if you do not have one) for your aftercare needs so that they can reassess your need for medications and monitor your lab values. If you do not have a primary care physician, you can call [phone] for a physician referral.   Increase activity slowly    Complete by:  As directed       ALLERGIES: No Known Allergies   Discharge Medication List as of 01/26/2017  1:07 PM    CONTINUE these medications which have CHANGED   Details  furosemide (LASIX) 40 MG tablet Take 1 tablet (40 mg total) by mouth daily. Alternate 40 mg every other day wit  20 mg every other day, Starting Fri 01/26/2017, Until Thu 04/26/2017, Print      CONTINUE these medications which have NOT CHANGED   Details  acetaminophen (TYLENOL) 500 MG tablet Take 1,000 mg by mouth 2 (two) times daily as needed (for various aches and pains). , Historical Med    amiodarone (PACERONE) 200 MG tablet Take 1 tablet (200 mg total) by mouth daily. Start taking 07/30/15, Starting Wed 07/26/2016, Print    aspirin EC 325 MG tablet Take 325 mg by mouth daily., Historical Med    Cholecalciferol (VITAMIN D3) 2000 units TABS Take 2,000 Units by mouth daily., Historical Med    donepezil (ARICEPT) 10 MG tablet Take 10 mg by mouth at bedtime.,  Historical Med    lamoTRIgine (LAMICTAL) 100 MG tablet Take 100 mg by mouth 2 (two) times daily., Historical Med    latanoprost (XALATAN) 0.005 % ophthalmic solution Place 1 drop into both eyes at bedtime., Historical Med    levETIRAcetam (KEPPRA) 750 MG tablet Take 750 mg by mouth 2 (two) times daily., Historical Med    memantine (NAMENDA) 10 MG tablet Take 10 mg by mouth 2 (two) times daily., Historical Med    metoprolol tartrate (LOPRESSOR) 25 MG tablet Take 1 tablet (25 mg total) by mouth 2 (two) times daily., Starting Wed 08/09/2016, Normal    Pancrelipase, Lip-Prot-Amyl, (CREON) 24000-76000 units CPEP Take 1 capsule by mouth 3 (three) times daily with meals. FOR DIGESTION, Historical Med    QUEtiapine (SEROQUEL) 25 MG tablet Take 12.5 mg by mouth every evening., Historical Med         Follow-up Information    Burtis Junes, NP Follow up on 01/31/2017.   Specialties:  Nurse Practitioner, Interventional Cardiology, Cardiology, Radiology Why:  10:30 AM (Dr. Merilynn Finland Practitioner)  Contact information: [place]. [address] [place] [address] [phone]           TOTAL DISCHARGE TIME: 35 mins  [facility] Pager [phone]  01/26/2017, 3:21 PM

## 2017-01-26 NOTE — Progress Notes (Signed)
Progress Note  Patient Name: Brittany [name] Date of Encounter: 01/26/2017  Primary Cardiologist: Dr. Tamala Julian   Subjective   Feels good. No complaints. No chest pain or dyspnea. Eager to go home.   Inpatient Medications    Scheduled Meds: . amiodarone  200 mg Oral Daily  . aspirin EC  325 mg Oral Daily  . cholecalciferol  2,000 Units Oral Daily  . donepezil  10 mg Oral QHS  . feeding supplement (ENSURE ENLIVE)  237 mL Oral BID BM  . furosemide  40 mg Oral Daily  . heparin  5,000 Units Subcutaneous Q8H  . lamoTRIgine  100 mg Oral BID  . latanoprost  1 drop Both Eyes QHS  . levETIRAcetam  750 mg Oral BID  . lipase/protease/amylase  24,000 Units Oral TID WC  . memantine  5 mg Oral BID  . metoprolol tartrate  25 mg Oral BID  . QUEtiapine  12.5 mg Oral QPM  . sodium chloride flush  3 mL Intravenous Q12H  . sodium chloride flush  3 mL Intravenous Q12H   Continuous Infusions: . sodium chloride     PRN Meds: sodium chloride, acetaminophen **OR** acetaminophen, albuterol, sodium chloride flush   Vital Signs    Vitals:   01/25/17 1826 01/25/17 2158 01/26/17 0443 01/26/17 0450  BP: 92/66 100/80  102/74  Pulse: (!) 110 (!) 119  (!) 59  Resp:  20  20  Temp:  98.1 F (36.7 C)  98.2 F (36.8 C)  TempSrc:  Oral  Oral  SpO2: 98% 100%  98%  Weight:   94 lb 1.6 oz (42.7 kg)   Height:        Intake/Output Summary (Last 24 hours) at 01/26/17 0748 Last data filed at 01/26/17 0600  Gross per 24 hour  Intake              840 ml  Output              604 ml  Net              236 ml   Filed Weights   01/24/17 0618 01/25/17 0420 01/26/17 0443  Weight: 94 lb 1.6 oz (42.7 kg) 97 lb 3.2 oz (44.1 kg) 94 lb 1.6 oz (42.7 kg)    Telemetry    Atrial flutter, in the 90s- low 100s - Personally Reviewed  ECG    Chronic aflutter - Personally Reviewed  Physical Exam   GEN: No acute distress.  frail Neck: No JVD Cardiac: irregular rhythm, no murmurs, rubs, or gallops.    Respiratory: Clear to auscultation bilaterally. GI: Soft, nontender, non-distended  MS: No edema; No deformity. Neuro:  Nonfocal   Psych: Normal affect   Labs    Chemistry Recent Labs Lab 01/22/17 0335 01/23/17 0343 01/25/17 1600  NA 138 137 134*  K 5.6* 3.7 4.5  CL 102 95* 98*  CO2 24 33* 26  GLUCOSE 95 106* 140*  BUN 33* 32* 29*  CREATININE 1.70* 1.56* 1.56*  CALCIUM 9.4 9.2 8.9  PROT 7.1  --  6.1*  ALBUMIN 4.0  --  2.9*  AST 63*  --  41  ALT 27  --  24  ALKPHOS 91  --  75  BILITOT 0.8  --  0.5  GFRNONAA 32* 35* 35*  GFRAA 37* 41* 41*  ANIONGAP 12 9 10      Hematology Recent Labs Lab 01/22/17 0335 01/23/17 0343 01/25/17 1600  WBC 7.9 6.3 4.7  RBC 5.23*  4.51 4.07  HGB 13.1 11.3* 10.0*  HCT 42.3 36.4 32.5*  MCV 80.9 80.7 79.9  MCH 25.0* 25.1* 24.6*  MCHC 31.0 31.0 30.8  RDW 18.1* 18.2* 17.6*  PLT 164 180 194    Cardiac Enzymes Recent Labs Lab 01/22/17 0338 01/22/17 0939 01/22/17 1407  TROPONINI 0.04* 0.04* 0.04*    Recent Labs Lab 01/22/17 0008  TROPIPOC 0.07     BNP Recent Labs Lab 01/22/17 0004 01/22/17 0335  BNP 1,761.8* 1,507.3*     DDimer  Recent Labs Lab 01/22/17 0338  DDIMER 1.41*     Radiology    Ct Abdomen Pelvis Wo Contrast  Result Date: 01/24/2017 CLINICAL DATA:  61 year old female with weight loss and hepatomegaly. History of hypertension and hepatitis. EXAM: CT ABDOMEN AND PELVIS WITHOUT CONTRAST TECHNIQUE: Multidetector CT imaging of the abdomen and pelvis was performed following the standard protocol without IV contrast. COMPARISON:  Abdominal ultrasound dated 01/22/2017 FINDINGS: Evaluation of this exam is limited in the absence of intravenous contrast. Lower chest: There are small bilateral pleural effusions. Bibasilar linear atelectasis/scarring noted. There is diffuse interstitial prominence consistent with pulmonary edema. Superimposed pneumonia is not excluded. Clinical correlation is recommended. There is mild  cardiomegaly. The heart is only partially visualized. A small pericardial effusion is suspected. There is no intra-abdominal free air or free fluid. Hepatobiliary: There is enlargement of the left lobe of the liver. No definite surface irregularity. The liver is otherwise unremarkable on this noncontrast CT. No intrahepatic biliary ductal dilatation. The gallbladder is predominantly contracted. No calcified gallstone. Pancreas: The pancreas is atrophic. There are coarse calcification of the pancreas most consistent with sequela of chronic pancreatitis. No dilatation of the main pancreatic duct. Spleen: Normal in size without focal abnormality. Adrenals/Urinary Tract: The adrenal glands are unremarkable. There is no hydronephrosis or nephrolithiasis. Mild perinephric stranding, nonspecific. Correlation with urinalysis recommended to exclude UTI. The urinary bladder is grossly unremarkable for the degree of distension. Stomach/Bowel: The stomach is distended with oral content. There is moderate stool in the distal colon. There is contrast in the proximal colon. There is no evidence of bowel obstruction or active inflammation. Normal appendix. Vascular/Lymphatic: There is moderate aortoiliac atherosclerotic disease. The abdominal aorta and IVC are grossly unremarkable on this noncontrast study. No portal venous gas identified. There is no adenopathy. Reproductive: The uterus is grossly unremarkable.  No pelvic masses. Other: There is diffuse subcutaneous edema and anasarca. There is loss of subcutaneous and intraperitoneal fat and cachexia. No fluid collection. Musculoskeletal: Left femoral orthopedic hardware and heterotopic bone formation. The visualized portion of the orthopedic hardware appears intact. The bones are osteopenic. No acute fracture. IMPRESSION: 1. No definite acute intra-abdominopelvic pathology identified. Mild distention of the stomach and moderate colonic stool burden. No bowel obstruction or  active inflammation. Normal appendix. 2. Enlargement of the left lobe of the liver. No of these surface irregularity. Clinical correlation is recommended. 3. Coarse calcification of the pancreas sequela of chronic pancreatitis. No evidence of active inflammation. 4. Loss of subcutaneous fat and cachexia. 5. Diffuse subcutaneous edema and anasarca. 6.  Aortic Atherosclerosis (ICD10-I70.0). 7. Cardiomegaly with evidence of pulmonary edema and small bilateral pleural effusions. Pneumonia is not excluded. Clinical correlation is recommended. Electronically Signed   By: Anner Crete M.D.   On: 01/24/2017 22:37   Dg Chest 2 View  Result Date: 01/25/2017 CLINICAL DATA:  CHF EXAM: CHEST  2 VIEW COMPARISON:  01/22/2017 FINDINGS: There is hyperinflation of the lungs compatible with COPD. Cardiomegaly. Improving airspace  disease throughout the left lung with continued right lung airspace opacity and small effusions. Findings likely reflect improving edema/ CHF or infection. IMPRESSION: COPD. Cardiomegaly. Improving airspace opacity vertically in the left lung. Residual right lower lung opacity and small effusions. Electronically Signed   By: Rolm Baptise M.D.   On: 01/25/2017 09:02    Cardiac Studies   2D echo 01/22/17 Study Conclusions - Left ventricle: The cavity size was normal. Wall thickness was increased in a pattern of mild LVH. Systolic function was normal. The estimated ejection fraction was in the range of 50% to 55%. There is hypokinesis of the apical myocardium. The study is not technically sufficient to allow evaluation of LV diastolic function. - Mitral valve: Calcified annulus. There was mild regurgitation. - Left atrium: The atrium was severely dilated. - Right atrium: The atrium was severely dilated. - Pulmonary arteries: Systolic pressure was mildly to moderately increased. PA peak pressure: 47 mm Hg (S). Impressions: - Apical hypokinesis with overall low normal LV  systolic function; mild LVH; mild MR; severe biatrial enlargement; mild TR with mild to moderate elevation in pulmonary pressure.  Patient Profile     61 y.o.femalewith chronic diastolic heart failure, persistent atrial fibrillation/flutter, prior h/o CVA, however no longer on anticoagulation due to frequent falls, dementia and chronic alcoholism. Also h/o COPD, seizure, hypertension, PAD, CKD stage III, and pancreatitis admitted for worsening dyspnea, found to have rapid HR and recurrent CHF - has had 2 prior admissions this year for [facility] in setting of dietary indiscretion with sodium.   Assessment & Plan    1. Acute on Chronic Diastolic UXN:ATFT 01/16/21 showednormal LVEF at 50-55%. Admit BNP markedly abnormal at 1,507. She has responded well to Lasix. Euvolemic. Breathing stable. Lungs CTAB. No peripheral edema. D/c home on PO lasix and BB.  Low salt diet. We discussed checking weight at home and monitoring for edema.   2. Persistent Atrial Fibrillation/Flutter w/ GUR:KYHC is better today in the 90s-low 100s. She is asymptomatic. It has been documented that patient has refused anticoagulation. Also high bleed risk due to falls, fragility, dementia and h/o chronic alcoholism. Amiodarone has been used for rate control, given issues with soft BP. Also on metoprolol. Continue regimen on discharge.      3. Abnormal Troponin: flat low level trend, 0.04 x 3 not c/w ACS. Likely demand ischemia from acute CHF and afib. she denies CP.No ischemic c/u indicated at this time.   4. Acute on CKD: SCr improved from admit to 1.82>>1.70>>1.56 (baseline ~1.5-1.6).   Dispo: stable from a cardiac standpoint for d/c. We will arrange post hospital f/u in our office.   Signed, Lyda Jester, PA-C  01/26/2017, 7:48 AM    Personally seen and examined. Agree with above.  Normal EF. Reassuring. PO lasix Lungs clear OK for DC  Candee Furbish, MD

## 2017-01-29 LAB — COMPREHENSIVE METABOLIC PANEL
ALK PHOS: 91 U/L (ref 38–126)
ALT: 27 U/L (ref 14–54)
ANION GAP: 12 (ref 5–15)
AST: 63 U/L — ABNORMAL HIGH (ref 15–41)
Albumin: 4 g/dL (ref 3.5–5.0)
BUN: 33 mg/dL — ABNORMAL HIGH (ref 6–20)
CALCIUM: 9.4 mg/dL (ref 8.9–10.3)
CO2: 24 mmol/L (ref 22–32)
Chloride: 102 mmol/L (ref 101–111)
Creatinine, Ser: 1.7 mg/dL — ABNORMAL HIGH (ref 0.44–1.00)
GFR calc non Af Amer: 32 mL/min — ABNORMAL LOW (ref >60)
GFR, EST AFRICAN AMERICAN: 37 mL/min — AB (ref >60)
Glucose, Bld: 95 mg/dL (ref 65–99)
Potassium: 5.6 mmol/L — ABNORMAL HIGH (ref 3.5–5.1)
Sodium: 138 mmol/L (ref 135–145)
TOTAL PROTEIN: 7.1 g/dL (ref 6.5–8.1)
Total Bilirubin: 0.8 mg/dL (ref 0.3–1.2)

## 2017-01-31 ENCOUNTER — Ambulatory Visit (INDEPENDENT_AMBULATORY_CARE_PROVIDER_SITE_OTHER): Payer: Medicare (Managed Care) | Admitting: Nurse Practitioner

## 2017-01-31 ENCOUNTER — Encounter: Payer: Self-pay | Admitting: Nurse Practitioner

## 2017-01-31 VITALS — BP 98/60 | HR 92 | Ht 63.5 in | Wt 92.1 lb

## 2017-01-31 DIAGNOSIS — I5032 Chronic diastolic (congestive) heart failure: Secondary | ICD-10-CM

## 2017-01-31 LAB — CBC
Hematocrit: 37.7 % (ref 34.0–46.6)
Hemoglobin: 11.9 g/dL (ref 11.1–15.9)
MCH: 25.3 pg — ABNORMAL LOW (ref 26.6–33.0)
MCHC: 31.6 g/dL (ref 31.5–35.7)
MCV: 80 fL (ref 79–97)
Platelets: 237 10*3/uL (ref 150–379)
RBC: 4.71 x10E6/uL (ref 3.77–5.28)
RDW: 18.4 % — ABNORMAL HIGH (ref 12.3–15.4)
WBC: 3.7 10*3/uL (ref 3.4–10.8)

## 2017-01-31 LAB — BASIC METABOLIC PANEL
BUN/Creatinine Ratio: 21 (ref 9–23)
BUN: 42 mg/dL — ABNORMAL HIGH (ref 6–24)
CO2: 25 mmol/L (ref 20–29)
Calcium: 9.9 mg/dL (ref 8.7–10.2)
Chloride: 92 mmol/L — ABNORMAL LOW (ref 96–106)
Creatinine, Ser: 1.98 mg/dL — ABNORMAL HIGH (ref 0.57–1.00)
GFR calc Af Amer: 31 mL/min/{1.73_m2} — ABNORMAL LOW (ref >59)
GFR calc non Af Amer: 27 mL/min/{1.73_m2} — ABNORMAL LOW (ref >59)
Glucose: 83 mg/dL (ref 65–99)
Potassium: 4.6 mmol/L (ref 3.5–5.2)
Sodium: 137 mmol/L (ref 134–144)

## 2017-01-31 NOTE — Progress Notes (Signed)
CARDIOLOGY OFFICE NOTE  Date:  01/31/2017    [name] Date of Birth: [birth year] Medical Record #[id number]  PCP:  System, Pcp Not In  Cardiologist:  [place]  Chief Complaint  Patient presents with  . Atrial Flutter    Post hospital visit - seen for Dr. Tamala Julian    History of Present Illness: Brittany [name] is a 61 y.o. female who presents today for a post hospital visit. Seen for Dr. Tamala Julian.   She has a history of diastolic heart failure, atrial fib/flutter with RVR, and BP issues. She is no longer on anticoagulation in light of frequent falls. She has dementia - on several agents. Other issues include prior CVA, CKD, COPD, seizures, pancreatitis, pulmonary HTN, PAD and chronic alcoholism.   Now with 3 admissions already this year - second one due to worsening dyspnea/acute diastolic HF - had not been compliant with diet - getting too much salt. Previously with an admission for shock and hypotension associated with diarrhea and uncontrolled AF. She was placed on Amiodarone due to poorly controlled rates and low BP.   I saw her back towards the end of January - very sad situation overall. She is at [facility] during the day. Son manages her medicines. Last seen by me back in March - still tenuous.   Admitted earlier this month with AF with RVR and associated pulmonary edema. Echo with EF 50 to 55%. Required BiPAP. Was diuresed. Weight loss noted - chronic pancreatitis on CT scan. Was to have further work up in outpatient setting.   Comes in today. Here with her son. Remains on high dose aspirin - has had prior stroke. She remains in chronic atrial flutter. She is not a candidate for anticoagulation. Tells me she does not know why she was back in the hospital but son notes that she got a hold of lots of salt packets and started using. Her dementia seems to be progressive. She admits that she intentionally stops eating when she cannot have salt. She has lost weight.   Past  Medical History:  Diagnosis Date  . Atrial flutter ([place])   . COPD (chronic obstructive pulmonary disease) ([place])   . CVA (cerebral vascular accident) ([place])   . 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 Diastolic heart failure ([place])   . HTN (hypertension)   . PAD (peripheral artery disease) ([place])   . Pancreatitis   . Seizures ([place])     Past Surgical History:  Procedure Laterality Date  . CENTRAL LINE INSERTION  07/21/2016      . SHOULDER SURGERY Left      Medications: Current Meds  Medication Sig  . acetaminophen (TYLENOL) 500 MG tablet Take 1,000 mg by mouth 2 (two) times daily as needed (for various aches and pains).   Marland Kitchen amiodarone (PACERONE) 200 MG tablet Take 1 tablet (200 mg total) by mouth daily. Start taking 07/30/15  . aspirin EC 325 MG tablet Take 325 mg by mouth daily.  . Cholecalciferol (VITAMIN D3) 2000 units TABS Take 2,000 Units by mouth daily.  Marland Kitchen donepezil (ARICEPT) 10 MG tablet Take 10 mg by mouth at bedtime.  . furosemide (LASIX) 40 MG tablet Take 1 tablet (40 mg total) by mouth daily. Alternate 40 mg every other day wit 20 mg every other day  . lamoTRIgine (LAMICTAL) 100 MG tablet Take 100 mg by mouth 2 (two) times daily.  Marland Kitchen latanoprost (XALATAN) 0.005 % ophthalmic solution Place 1 drop into both eyes  at bedtime.  . levETIRAcetam (KEPPRA) 750 MG tablet Take 750 mg by mouth 2 (two) times daily.  . memantine (NAMENDA) 10 MG tablet Take 10 mg by mouth 2 (two) times daily.  . metoprolol tartrate (LOPRESSOR) 25 MG tablet Take 1 tablet (25 mg total) by mouth 2 (two) times daily.  . Pancrelipase, Lip-Prot-Amyl, (CREON) 24000-76000 units CPEP Take 1 capsule by mouth 3 (three) times daily with meals. FOR DIGESTION  . QUEtiapine (SEROQUEL) 25 MG tablet Take 12.5 mg by mouth every evening.     Allergies: No Known Allergies  Social History: The patient  reports that she has quit smoking. She has never used smokeless tobacco. She reports that she drinks alcohol. She reports that  she does not use drugs.   Family History: The patient's family history includes Cancer in her child and mother; Cerebrovascular Accident in her child; Gout in her child; Hypertension in her father.   Review of Systems: Please see the history of present illness.   Otherwise, the review of systems is positive for none.   All other systems are reviewed and negative.   Physical Exam: VS:  BP 98/60 (BP Location: Left Arm, Patient Position: Sitting, Cuff Size: Normal)   Pulse 92   Ht 5' 3.5" (1.613 m)   Wt 92 lb 1.9 oz (41.8 kg)   BMI 16.06 kg/m  .  BMI Body mass index is 16.06 kg/m.  Wt Readings from Last 3 Encounters:  01/31/17 92 lb 1.9 oz (41.8 kg)  01/26/17 94 lb 1.6 oz (42.7 kg)  10/11/16 101 lb 12.8 oz (46.2 kg)    General: Very thin. Weight down almost 10 pounds since March. Chronically ill but in no acute distress. Not able to reason with her due to her dementia.    HEENT: Normal.  Neck: Supple, no JVD, carotid bruits, or masses noted.  Cardiac: Irregular rhythm. Rate is ok.  No edema.  Respiratory:  Lungs are clear to auscultation bilaterally with normal work of breathing.  GI: Soft and nontender.  MS: No deformity or atrophy. Gait and ROM intact.  Skin: Warm and dry. Color is normal.  Neuro:  Strength and sensation are intact and no gross focal deficits noted.  Psych: Alert, appropriate but with abnormal affect - not able to reason with her rationally.   LABORATORY DATA:  EKG:  EKG is ordered today. This demonstrates atrial flutter - VR of 92.  Lab Results  Component Value Date   WBC 4.7 01/25/2017   HGB 10.0 (L) 01/25/2017   HCT 32.5 (L) 01/25/2017   PLT 194 01/25/2017   GLUCOSE 140 (H) 01/25/2017   CHOL 76 01/25/2017   TRIG 56 01/25/2017   HDL 46 01/25/2017   LDLCALC 19 01/25/2017   ALT 24 01/25/2017   AST 41 01/25/2017   NA 134 (L) 01/25/2017   K 4.5 01/25/2017   CL 98 (L) 01/25/2017   CREATININE 1.56 (H) 01/25/2017   BUN 29 (H) 01/25/2017   CO2 26  01/25/2017   TSH 1.947 01/22/2017   INR 1.19 07/21/2016   HGBA1C 6.2 (H) 01/22/2017     BNP (last 3 results)  Recent Labs  08/04/16 1245 01/22/17 0004 01/22/17 0335  BNP 1,367.2* 1,761.8* 1,507.3*    ProBNP (last 3 results)  Recent Labs  10/11/16 1033  PROBNP 8,862*     Other Studies Reviewed Today:  Echo Study Conclusions 01/2017  - Left ventricle: The cavity size was normal. Wall thickness was   increased in a  pattern of mild LVH. Systolic function was normal.   The estimated ejection fraction was in the range of 50% to 55%.   There is hypokinesis of the apical myocardium. The study is not   technically sufficient to allow evaluation of LV diastolic   function. - Mitral valve: Calcified annulus. There was mild regurgitation. - Left atrium: The atrium was severely dilated. - Right atrium: The atrium was severely dilated. - Pulmonary arteries: Systolic pressure was mildly to moderately   increased. PA peak pressure: 47 mm Hg (S).  Impressions:  - Apical hypokinesis with overall low normal LV systolic function;   mild LVH; mild MR; severe biatrial enlargement; mild TR with mild   to moderate elevation in pulmonary pressure.   Assessment/Plan:  1. Recent admission for AF/flutter with RVR and associated pulmonary edema -due to noncompliance - I think this will be a repetitive situation going forward and with poor prognosis. Son is considering placement in SNF.   2. Chronic diastolic heart failure - recent exacerbation - she does not wish to restrict her salt.   3. Persistent A Flutter --CHADSVASc at least 6, but high risk for falls and bleed. She is no longer a candidate for anticoagulation. Her rate is controlled. She is on both amiodarone and metoprolol for rate control. Labs today. Given her multiple issues and overall poor prognosis - I would favor not sending her for PFTs.   4. CKD stage 3 - rechecking lab today.    5. Dementia - onaricept,  namenda, seroquel   6 Seizure disorderonlamictal, keppra   7. Past alcohol abuse  8. High risk medicine - labs today.   9. Weight loss - I suspect this is actually more intentional.    Current medicines are reviewed with the patient today.  The patient does not have concerns regarding medicines other than what has been noted above.  The following changes have been made:  See above.  Labs/ tests ordered today include:    Orders Placed This Encounter  Procedures  . Basic metabolic panel  . CBC  . EKG 12-Lead     Disposition:   FU with me in 3 to 4 months.   Patient is agreeable to this plan and will call if any problems develop in the interim.   SignedTruitt Merle, NP  01/31/2017 11:07 AM  [facility] [address] [place], [place]  [address] Phone: [phone] Fax: [phone]

## 2017-01-31 NOTE — Patient Instructions (Addendum)
We will be checking the following labs today - BMET and CBC   Medication Instructions:    Continue with your current medicines.     Testing/Procedures To Be Arranged:  N/A  Follow-Up:   See me in 3 to 4 months    Other Special Instructions:   N/A    If you need a refill on your cardiac medications before your next appointment, please call your pharmacy.   Call the [facility] office at [phone] if you have any questions, problems or concerns.

## 2017-02-01 ENCOUNTER — Other Ambulatory Visit: Payer: Self-pay | Admitting: *Deleted

## 2017-02-01 DIAGNOSIS — I5032 Chronic diastolic (congestive) heart failure: Secondary | ICD-10-CM

## 2017-02-15 ENCOUNTER — Other Ambulatory Visit: Payer: Medicare (Managed Care)

## 2017-02-22 ENCOUNTER — Emergency Department (HOSPITAL_COMMUNITY)
Admission: EM | Admit: 2017-02-22 | Discharge: 2017-02-22 | Disposition: A | Discharge status: Home / Self Care | Payer: Medicare (Managed Care) | Attending: Emergency Medicine | Admitting: Emergency Medicine

## 2017-02-22 ENCOUNTER — Emergency Department (HOSPITAL_COMMUNITY): Payer: Medicare (Managed Care)

## 2017-02-22 DIAGNOSIS — R531 Weakness: Secondary | ICD-10-CM | POA: Insufficient documentation

## 2017-02-22 DIAGNOSIS — Z7982 Long term (current) use of aspirin: Secondary | ICD-10-CM | POA: Diagnosis not present

## 2017-02-22 DIAGNOSIS — R0602 Shortness of breath: Secondary | ICD-10-CM | POA: Diagnosis present

## 2017-02-22 DIAGNOSIS — J449 Chronic obstructive pulmonary disease, unspecified: Secondary | ICD-10-CM | POA: Diagnosis not present

## 2017-02-22 DIAGNOSIS — Z79899 Other long term (current) drug therapy: Secondary | ICD-10-CM | POA: Diagnosis not present

## 2017-02-22 DIAGNOSIS — I130 Hypertensive heart and chronic kidney disease with heart failure and stage 1 through stage 4 chronic kidney disease, or unspecified chronic kidney disease: Secondary | ICD-10-CM | POA: Diagnosis not present

## 2017-02-22 DIAGNOSIS — Z87891 Personal history of nicotine dependence: Secondary | ICD-10-CM | POA: Diagnosis not present

## 2017-02-22 DIAGNOSIS — R42 Dizziness and giddiness: Secondary | ICD-10-CM | POA: Insufficient documentation

## 2017-02-22 DIAGNOSIS — I4892 Unspecified atrial flutter: Secondary | ICD-10-CM

## 2017-02-22 DIAGNOSIS — N183 Chronic kidney disease, stage 3 (moderate): Secondary | ICD-10-CM | POA: Insufficient documentation

## 2017-02-22 DIAGNOSIS — I5032 Chronic diastolic (congestive) heart failure: Secondary | ICD-10-CM | POA: Insufficient documentation

## 2017-02-22 DIAGNOSIS — I4891 Unspecified atrial fibrillation: Secondary | ICD-10-CM | POA: Diagnosis not present

## 2017-02-22 DIAGNOSIS — Z8673 Personal history of transient ischemic attack (TIA), and cerebral infarction without residual deficits: Secondary | ICD-10-CM | POA: Insufficient documentation

## 2017-02-22 LAB — CBC WITH DIFFERENTIAL/PLATELET
Basophils Absolute: 0 10*3/uL (ref 0.0–0.1)
Basophils Relative: 0 %
Eosinophils Absolute: 0 10*3/uL (ref 0.0–0.7)
Eosinophils Relative: 1 %
HCT: 33.2 % — ABNORMAL LOW (ref 36.0–46.0)
Hemoglobin: 10.5 g/dL — ABNORMAL LOW (ref 12.0–15.0)
Lymphocytes Relative: 25 %
Lymphs Abs: 1.5 10*3/uL (ref 0.7–4.0)
MCH: 24.3 pg — ABNORMAL LOW (ref 26.0–34.0)
MCHC: 31.6 g/dL (ref 30.0–36.0)
MCV: 76.9 fL — ABNORMAL LOW (ref 78.0–100.0)
Monocytes Absolute: 0.3 10*3/uL (ref 0.1–1.0)
Monocytes Relative: 5 %
Neutro Abs: 4 10*3/uL (ref 1.7–7.7)
Neutrophils Relative %: 69 %
Platelets: 229 10*3/uL (ref 150–400)
RBC: 4.32 MIL/uL (ref 3.87–5.11)
RDW: 17.3 % — ABNORMAL HIGH (ref 11.5–15.5)
WBC: 5.9 10*3/uL (ref 4.0–10.5)

## 2017-02-22 LAB — BASIC METABOLIC PANEL
Anion gap: 10 (ref 5–15)
BUN: 39 mg/dL — ABNORMAL HIGH (ref 6–20)
CO2: 27 mmol/L (ref 22–32)
Calcium: 9.1 mg/dL (ref 8.9–10.3)
Chloride: 100 mmol/L — ABNORMAL LOW (ref 101–111)
Creatinine, Ser: 1.92 mg/dL — ABNORMAL HIGH (ref 0.44–1.00)
GFR calc Af Amer: 32 mL/min — ABNORMAL LOW (ref >60)
GFR calc non Af Amer: 27 mL/min — ABNORMAL LOW (ref >60)
Glucose, Bld: 74 mg/dL (ref 65–99)
Potassium: 4.1 mmol/L (ref 3.5–5.1)
Sodium: 137 mmol/L (ref 135–145)

## 2017-02-22 LAB — I-STAT TROPONIN, ED: Troponin i, poc: 0.04 ng/mL (ref 0.00–0.08)

## 2017-02-22 LAB — BRAIN NATRIURETIC PEPTIDE: B Natriuretic Peptide: 1821.3 pg/mL — ABNORMAL HIGH (ref 0.0–100.0)

## 2017-02-22 MED ORDER — METOPROLOL TARTRATE 5 MG/5ML IV SOLN
5.0000 mg | Freq: Once | INTRAVENOUS | Status: AC
  Administered 2017-02-22: 5 mg via INTRAVENOUS
  Filled 2017-02-22: qty 5

## 2017-02-22 NOTE — ED Provider Notes (Signed)
[facility] DEPT Provider Note   CSN: [id number] Arrival date & time: 02/22/17  1218     History   Chief Complaint Chief Complaint  Patient presents with  . Atrial Fibrillation  . Weakness  . Dizziness    HPI Terrye Dombrosky is a 61 y.o. female.  HPI  Level 5 caveat due to dementia    61 year old female with a past medical history of diastolic congestive heart failure, atrial flutter, chronic kidney disease stage III, dementia, seizures presents today with complaints of shortness of breath. Patient notes that she was sitting on the toilet, after using the restroom she went to walk to her bedroom and had an episode of shortness of breath and dizziness. She notes this lasted approximately 15 seconds and then passed on its own. She reports symptoms have resolved at this time. Patient reports that she has been taking her medications daily.  Chart review shows patient was discharged from the hospital on 01/26/2017 status post respiratory failure likely secondary to A. fib with RVR causing pulmonary edema.  ( Discussing above past medical history with patient she appears confused, does not understand these health conditions or that she actually has these)    Past Medical History:  Diagnosis Date  . Atrial flutter ([place])   . COPD (chronic obstructive pulmonary disease) ([place])   . CVA (cerebral vascular accident) ([place])   . 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 Diastolic heart failure ([place])   . HTN (hypertension)   . PAD (peripheral artery disease) ([place])   . Pancreatitis   . Seizures [facility])     Patient Active Problem List   Diagnosis Date Noted  . CHF (congestive heart failure) ([place]) 01/22/2017  . Acute renal failure (ARF) ([place]) 01/22/2017  . Atrial fibrillation with RVR ([place]) 01/22/2017  . Acute renal failure with acute tubular necrosis superimposed on stage 3 chronic kidney disease ([place])   . Dementia 08/05/2016  . CKD (chronic kidney disease) stage 3, GFR 30-59 ml/min 08/05/2016   . Seizures ([place]) 08/05/2016  . Acute on chronic diastolic heart failure ([place]) 08/04/2016  . Malnutrition of moderate degree 07/26/2016  . Atrial flutter ([place])   . AKI (acute kidney injury) ([place])   . Cardiogenic shock ([place]) 07/21/2016  . Syncope and collapse   . Encounter for central line care   . Hypotension     Past Surgical History:  Procedure Laterality Date  . CENTRAL LINE INSERTION  07/21/2016      . SHOULDER SURGERY Left     OB History    No data available       Home Medications    Prior to Admission medications   Medication Sig Start Date End Date Taking? Authorizing Provider  acetaminophen (TYLENOL) 500 MG tablet Take 1,000 mg by mouth 2 (two) times daily as needed (for various aches and pains).     [provider]  amiodarone (PACERONE) 200 MG tablet Take 1 tablet (200 mg total) by mouth daily. Start taking 07/30/15 07/26/16   Caren Griffins, MD  aspirin EC 325 MG tablet Take 325 mg by mouth daily.    [provider]  Cholecalciferol (VITAMIN D3) 2000 units TABS Take 2,000 Units by mouth daily.    [provider]  donepezil (ARICEPT) 10 MG tablet Take 10 mg by mouth at bedtime.    [provider]  furosemide (LASIX) 40 MG tablet Take 1 tablet (40 mg total) by mouth daily. Alternate 40 mg every other day wit 20  mg every other day 01/26/17 04/26/17  Bonnielee Haff, MD  lamoTRIgine (LAMICTAL) 100 MG tablet Take 100 mg by mouth 2 (two) times daily.    [provider]  latanoprost (XALATAN) 0.005 % ophthalmic solution Place 1 drop into both eyes at bedtime.    [provider]  levETIRAcetam (KEPPRA) 750 MG tablet Take 750 mg by mouth 2 (two) times daily.    [provider]  memantine (NAMENDA) 10 MG tablet Take 10 mg by mouth 2 (two) times daily.    [provider]  metoprolol tartrate (LOPRESSOR) 25 MG tablet Take 1 tablet (25 mg total) by mouth 2 (two) times daily. 08/09/16   Eber Jones, MD    Pancrelipase, Lip-Prot-Amyl, (CREON) 24000-76000 units CPEP Take 1 capsule by mouth 3 (three) times daily with meals. FOR DIGESTION    [provider]  QUEtiapine (SEROQUEL) 25 MG tablet Take 12.5 mg by mouth every evening.    [provider]    Family History Family History  Problem Relation Age of Onset  . Hypertension Father   . Cancer Mother   . Cancer Child   . Cerebrovascular Accident Child   . Gout Child     Social History Social History  Substance Use Topics  . Smoking status: Former Research scientist (life sciences)  . Smokeless tobacco: Never Used  . Alcohol use Yes     Allergies   Patient has no known allergies.   Review of Systems Review of Systems  All other systems reviewed and are negative.    Physical Exam Updated Vital Signs BP (!) 121/92 (BP Location: Right Arm)   Pulse 85   Temp 98.7 F (37.1 C) (Oral)   Resp (!) 23   Ht 5\' 3"  (1.6 m)   Wt 45.4 kg (100 lb)   SpO2 98%   BMI 17.71 kg/m   Physical Exam  Constitutional: She is oriented to person, place, and time. She appears well-developed and well-nourished.  HENT:  Head: Normocephalic and atraumatic.  Eyes: Pupils are equal, round, and reactive to light. Conjunctivae are normal. Right eye exhibits no discharge. Left eye exhibits no discharge. No scleral icterus.  Neck: Normal range of motion. No JVD present. No tracheal deviation present.  Cardiovascular:  Irregular rhythm   Pulmonary/Chest: Effort normal. No stridor. No respiratory distress.  Bilateral crackles  Musculoskeletal: She exhibits no edema.  Neurological: She is alert and oriented to person, place, and time. Coordination normal.  Psychiatric: She has a normal mood and affect. Her behavior is normal. Judgment and thought content normal.  Nursing note and vitals reviewed.    ED Treatments / Results  Labs (all labs ordered are listed, but only abnormal results are displayed) Labs Reviewed  CBC WITH DIFFERENTIAL/PLATELET - Abnormal;  Notable for the following:       Result Value   Hemoglobin 10.5 (*)    HCT 33.2 (*)    MCV 76.9 (*)    MCH 24.3 (*)    RDW 17.3 (*)    All other components within normal limits  BASIC METABOLIC PANEL - Abnormal; Notable for the following:    Chloride 100 (*)    BUN 39 (*)    Creatinine, Ser 1.92 (*)    GFR calc non Af Amer 27 (*)    GFR calc Af Amer 32 (*)    All other components within normal limits  BRAIN NATRIURETIC PEPTIDE - Abnormal; Notable for the following:    B Natriuretic Peptide 1,821.3 (*)  All other components within normal limits  I-STAT TROPONIN, ED    EKG  EKG Interpretation None       Radiology Dg Chest 2 View  Result Date: 02/22/2017 CLINICAL DATA:  Generalized weakness, dizziness and new onset atrial fibrillation. Symptoms beginning this morning. EXAM: CHEST  2 VIEW COMPARISON:  01/25/2017 FINDINGS: Lungs are adequately inflated with stable opacification over the right mid to lower lung and subtle stable hazy density over the central left lung. Improved left base atelectasis. Suggestion of a small amount right pleural fluid unchanged mild stable cardiomegaly. Remainder of the exam is unchanged. IMPRESSION: Stable bilateral airspace opacification right greater than left. Stable small amount right pleural fluid. Stable cardiomegaly. Electronically Signed   By: Marin Olp M.D.   On: 02/22/2017 13:32    Procedures Procedures (including critical care time)  Medications Ordered in ED Medications  metoprolol tartrate (LOPRESSOR) injection 5 mg (5 mg Intravenous Given 02/22/17 1435)     Initial Impression / Assessment and Plan / ED Course  I have reviewed the triage vital signs and the nursing notes.  Pertinent labs & imaging results that were available during my care of the patient were reviewed by me and considered in my medical decision making (see chart for details).      Final Clinical Impressions(s) / ED Diagnoses   Final diagnoses:  SOB  (shortness of breath)  Atrial fibrillation, unspecified type ([place])   Labs: I-STAT troponin, CBC, BMP, BNP  Imaging: DG chest 2 view  Consults: Cardiology  Therapeutics: Lopressor  Discharge Meds:   Assessment/Plan: 61 year old female presents today with likely A. Fib/flutter with RVR. Patient has a history dementia, she reports dizziness and shortness of breath earlier in the day. When discussing patient's sisters with her she denies any cardiac history. Question worsening heart failure in the setting of uncontrolled A. fib. Patient reports that she is taking her home medications. Cardiology will be consult at before guidance on this patient known to their service.  Cardiology consult that recommending the patient be discharged with minor adjustments to her medication. Patient instructed to increase her doses of Lasix and metoprolol. She will follow up as an outpatient with cardiology. She is given strict return cautioned P Richard verbalized understanding and agreement to today's plan had no further questions or concerns at time of discharge.   New Prescriptions New Prescriptions   No medications on file     Francee Gentile 02/22/17 1539    Shuntay Everetts, Dellis Filbert, PA-C 02/22/17 1646    Milton Ferguson, MD 02/23/17 [phone]

## 2017-02-22 NOTE — Discharge Instructions (Signed)
°  Please read attached information. If you experience any new or worsening signs or symptoms please return to the emergency room for evaluation. Please follow-up with your primary care provider or specialist as discussed. Please use medication prescribed only as directed and discontinue taking if you have any concerning signs or symptoms.   Please follow recommendation by Cardiology for medication change- Increase lasix to 40 mg daily and increase Metoprolol to 37.5 mg BID

## 2017-02-22 NOTE — ED Notes (Signed)
Admitting physician at bedside at this time.

## 2017-02-22 NOTE — ED Triage Notes (Signed)
Patient comes in per [facility] with c/o generalized weakness, episode of dizziness, and new onset afib/aflutter. Patient stated it started when she woke up this AM. Dizziness now resolved. EMS ekg showed new onset afib. HR 70-120. EMS v/s 98/73, 20 RR, 97% RA, 110 HR irreg, 97.4 temp. Denies cp, palpitations.

## 2017-02-22 NOTE — Consult Note (Signed)
Cardiology Consultation:   Patient ID: Brittany [name]; [id number]; 1955-07-26   Admit date: 02/22/2017 Date of Consult: 02/22/2017  Primary Care Provider: System, Pcp Not In Primary Cardiologist: Dr. Tamala Julian  Patient Profile:   Brittany [name] is a 60 y.o. female with a hx of  Chronic diastolic heart failure, persistent atrial fibrillation/flutter, no longer on anticoagulation due to frequent falls, dementia, CVA, COPD, seizure disorder, hypertension, PAD, CKD stage III, chronic alcoholism and pancreatitis who is being seen today for the evaluation of atrial fibrillation with RVR at the request of Dr. Roderic Palau.  She has been admitted 3 times this year. Twice for acute dyspnea in the setting of diastolic heart failure-- eats a lot of salt or refuses to eat and loses weight. And previous admission 7/8- 7/13 for atrial fibrillation with RVR.  History of Present Illness:   [name] presented to the ER today with atrial flutter with rates 120-140s, She has a history of persistent atrial fibrillation/flutter. She had just used the restroom and when walking from bathroom to bedroom she developed a brief episode of dizziness and SOB.  EKG noted with HR 117 and sinus tachycardia. No troponin lab value for this admission. BNP is 1,820 which is around baseline for patient. Most recent EF is 50-55% (01/2017) with a dilated right and left atrium. Chest xray with stable amount of right pleural fluid and cardiomegaly. Pt currently asymptomatic and wants to go home.  She takes Amiodarone 200 mg daily and Lopressor 25 mg BID daily.  The patient has significant dementia and obtaining and HPI and ROS is difficult, no family members or caregivers are present.    Past Medical History:  Diagnosis Date  . Atrial flutter ([place])   . COPD (chronic obstructive pulmonary disease) ([place])   . CVA (cerebral vascular accident) ([place])   . 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 Diastolic heart failure ([place])   . HTN  (hypertension)   . PAD (peripheral artery disease) ([place])   . Pancreatitis   . Seizures ([place])     Past Surgical History:  Procedure Laterality Date  . CENTRAL LINE INSERTION  07/21/2016      . SHOULDER SURGERY Left      Inpatient Medications: Scheduled Meds:  Continuous Infusions:  PRN Meds:   Allergies:   No Known Allergies  Social History:   Social History   Social History  . Marital status: Widowed    Spouse name: N/A  . Number of children: N/A  . Years of education: N/A   Occupational History  . Not on file.   Social History Main Topics  . Smoking status: Former Research scientist (life sciences)  . Smokeless tobacco: Never Used  . Alcohol use Yes  . Drug use: No  . Sexual activity: Not on file   Other Topics Concern  . Not on file   Social History Narrative  . No narrative on file    Family History:   Family History  Problem Relation Age of Onset  . Hypertension Father   . Cancer Mother   . Cancer Child   . Cerebrovascular Accident Child   . Gout Child      ROS:  Please see the history of present illness.  ROS  All other ROS reviewed and negative, ROS limited by dementia  Physical Exam/Data:   Vitals:   02/22/17 1245 02/22/17 1330 02/22/17 1430 02/22/17 1500  BP: 101/85 105/88 (!) 118/97 (!) 111/97  Pulse:    (!) 108  Resp: 15 18  18 18  Temp:      TempSrc:      SpO2:   99% 99%  Weight:      Height:       No intake or output data in the 24 hours ending 02/22/17 1533 Filed Weights   02/22/17 1219  Weight: 100 lb (45.4 kg)   Body mass index is 17.71 kg/m.  General:  Frail, malnourished. NAD HEENT: normal Lymph: no adenopathy Neck: no JVD Endocrine:  No thryomegaly Vascular: No carotid bruits; FA pulses 2+ bilaterally without bruits  Cardiac:  Tachycardic with irregular rhythm. Lungs:  clear to auscultation bilaterally, no wheezing, rhonchi or rales  Abd: soft, nontender, no hepatomegaly  Ext: no edema Musculoskeletal:  No deformities, BUE and BLE  strength normal and equal Skin: warm and dry  Neuro:  CNs 2-12 intact, no focal abnormalities noted + dementia Psych:  Normal affect   EKG:  The EKG was personally reviewed and demonstrates:  Atrial flutter with rates  HR 117   Relevant CV Studies: 01/22/2017 Echocardiogram  Study Conclusions  - Left ventricle: The cavity size was normal. Wall thickness was   increased in a pattern of mild LVH. Systolic function was normal.   The estimated ejection fraction was in the range of 50% to 55%.   There is hypokinesis of the apical myocardium. The study is not   technically sufficient to allow evaluation of LV diastolic   function. - Mitral valve: Calcified annulus. There was mild regurgitation. - Left atrium: The atrium was severely dilated. - Right atrium: The atrium was severely dilated. - Pulmonary arteries: Systolic pressure was mildly to moderately   increased. PA peak pressure: 47 mm Hg (S).  Impressions:  - Apical hypokinesis with overall low normal LV systolic function;   mild LVH; mild MR; severe biatrial enlargement; mild TR with mild   to moderate elevation in pulmonary pressure.  Laboratory Data:  Chemistry Recent Labs Lab 02/22/17 1253  NA 137  K 4.1  CL 100*  CO2 27  GLUCOSE 74  BUN 39*  CREATININE 1.92*  CALCIUM 9.1  GFRNONAA 27*  GFRAA 32*  ANIONGAP 10    No results for input(s): PROT, ALBUMIN, AST, ALT, ALKPHOS, BILITOT in the last 168 hours. Hematology Recent Labs Lab 02/22/17 1253  WBC 5.9  RBC 4.32  HGB 10.5*  HCT 33.2*  MCV 76.9*  MCH 24.3*  MCHC 31.6  RDW 17.3*  PLT 229   Cardiac EnzymesNo results for input(s): TROPONINI in the last 168 hours.  Recent Labs Lab 02/22/17 1302  TROPIPOC 0.04    BNP Recent Labs Lab 02/22/17 1253  BNP 1,821.3*    DDimer No results for input(s): DDIMER in the last 168 hours.  Radiology/Studies:  Dg Chest 2 View  Result Date: 02/22/2017 CLINICAL DATA:  Generalized weakness, dizziness and new  onset atrial fibrillation. Symptoms beginning this morning. EXAM: CHEST  2 VIEW COMPARISON:  01/25/2017 FINDINGS: Lungs are adequately inflated with stable opacification over the right mid to lower lung and subtle stable hazy density over the central left lung. Improved left base atelectasis. Suggestion of a small amount right pleural fluid unchanged mild stable cardiomegaly. Remainder of the exam is unchanged. IMPRESSION: Stable bilateral airspace opacification right greater than left. Stable small amount right pleural fluid. Stable cardiomegaly. Electronically Signed   By: Marin Olp M.D.   On: 02/22/2017 13:32    Assessment and Plan:   1. Persistent Atrial flutter with RVR: CHADSVASc at least 6.  She is not an anticoagulation candidate given her frequent falls and high risk for bleeding. -- She is on amiodarone and metoprolol for her rate control.  2. Chronic systolic heart failure: BNP is elevated > 1800, this is somewhat around her baseline. She admits to eating a lot of salt and if it is kept from her she refuses to eat. Her weight today is 100 lbs and at discharge on 7/13 it was 94 lbs. Unclear what her dry weight is since patient sometimes refuses to eat. -- Increase lasix to 40 mg daily and increase Metoprolol to 37.5 mg BID  3. Dementia: medicine to manage  4. Seizures: Medicine to manage.  5. COPD: Medicine to manage  Patient okay for home from cardiology perspective.    Signed, Linus Mako, PA-C  02/22/2017  As above, patient seen and examined. Briefly she is a 61 year old female with past medical history of chronic diastolic congestive heart failure, atrial flutter, dementia, COPD, CVA, seizure disorder, chronic stage III kidney disease, hypertension, history of alcoholism who I'm asked to evaluate for atrial flutter with elevated rate and acute on chronic diastolic congestive heart failure. Echocardiogram July 2018 showed normal LV systolic function, mild mitral  regurgitation and severe biatrial enlargement. Patient apparently has had difficulties with compliance with diet. She is felt not to be a candidate for anticoagulation given dementia and history of falls. Her atrial flutter is controlled with metoprolol and amiodarone. Patient is a very difficult historian. However this morning she states she felt short of breath transiently with fatigue. She had to lay on her couch. There is no chest pain, palpitations or syncope. She presented to the emergency room and now states her symptoms have resolved. Creatinine is 1.92 with BUN 39. BNP 1821. Troponin 0.04. Hemoglobin 10.5. Electrocardiogram shows atrial flutter with elevated rate, left ventricular hypertrophy and inferior lateral T-wave inversion.  1 chronic diastolic congestive heart failure-patient is noncompliant with diet. She does not appear to be markedly volume overloaded on examination. I will increase Lasix to 40 mg daily. We will arrange a transition of care appointment in one week and check potassium and renal function at that time.  2 atrial flutter-patient remains in atrial flutter. CHADSvasc 5. However she cannot be anticoagulated because of her dementia and frequent falls. Plan is for rate control. Continue amiodarone. Increase metoprolol to 37.5 mg twice a day.  3 chronic stage III kidney disease- we will need to check potassium and renal function one week after discharge.   Patient can be discharged from a cardiac standpoint. FU [name] and Dr Sherrilyn Rist, MD

## 2017-03-23 ENCOUNTER — Ambulatory Visit
Admission: RE | Admit: 2017-03-23 | Discharge: 2017-03-23 | Disposition: A | Discharge status: Home / Self Care | Payer: Medicare (Managed Care) | Source: Ambulatory Visit | Attending: Nurse Practitioner | Admitting: Nurse Practitioner

## 2017-03-23 ENCOUNTER — Other Ambulatory Visit: Payer: Self-pay | Admitting: Nurse Practitioner

## 2017-03-23 DIAGNOSIS — M549 Dorsalgia, unspecified: Secondary | ICD-10-CM

## 2017-04-16 ENCOUNTER — Inpatient Hospital Stay (HOSPITAL_COMMUNITY)
Admission: EM | Admit: 2017-04-16 | Discharge: 2017-04-20 | DRG: 308 | Disposition: A | Discharge status: Home / Self Care | Payer: Medicare (Managed Care) | Attending: Family Medicine | Admitting: Family Medicine

## 2017-04-16 ENCOUNTER — Emergency Department (HOSPITAL_COMMUNITY): Payer: Medicare (Managed Care)

## 2017-04-16 ENCOUNTER — Encounter (HOSPITAL_COMMUNITY): Payer: Self-pay

## 2017-04-16 DIAGNOSIS — I5032 Chronic diastolic (congestive) heart failure: Secondary | ICD-10-CM | POA: Diagnosis present

## 2017-04-16 DIAGNOSIS — Z79899 Other long term (current) drug therapy: Secondary | ICD-10-CM | POA: Diagnosis not present

## 2017-04-16 DIAGNOSIS — Z9181 History of falling: Secondary | ICD-10-CM

## 2017-04-16 DIAGNOSIS — A419 Sepsis, unspecified organism: Secondary | ICD-10-CM | POA: Diagnosis not present

## 2017-04-16 DIAGNOSIS — Z681 Body mass index (BMI) 19 or less, adult: Secondary | ICD-10-CM | POA: Diagnosis not present

## 2017-04-16 DIAGNOSIS — I4892 Unspecified atrial flutter: Secondary | ICD-10-CM | POA: Diagnosis present

## 2017-04-16 DIAGNOSIS — J189 Pneumonia, unspecified organism: Secondary | ICD-10-CM | POA: Diagnosis not present

## 2017-04-16 DIAGNOSIS — R0902 Hypoxemia: Secondary | ICD-10-CM | POA: Diagnosis present

## 2017-04-16 DIAGNOSIS — R000 Tachycardia, unspecified: Secondary | ICD-10-CM | POA: Diagnosis present

## 2017-04-16 DIAGNOSIS — G309 Alzheimer's disease, unspecified: Secondary | ICD-10-CM | POA: Diagnosis present

## 2017-04-16 DIAGNOSIS — R0600 Dyspnea, unspecified: Secondary | ICD-10-CM

## 2017-04-16 DIAGNOSIS — F0280 Dementia in other diseases classified elsewhere without behavioral disturbance: Secondary | ICD-10-CM | POA: Diagnosis present

## 2017-04-16 DIAGNOSIS — R296 Repeated falls: Secondary | ICD-10-CM | POA: Diagnosis present

## 2017-04-16 DIAGNOSIS — Z809 Family history of malignant neoplasm, unspecified: Secondary | ICD-10-CM

## 2017-04-16 DIAGNOSIS — G3183 Dementia with Lewy bodies: Secondary | ICD-10-CM | POA: Diagnosis not present

## 2017-04-16 DIAGNOSIS — I272 Pulmonary hypertension, unspecified: Secondary | ICD-10-CM | POA: Diagnosis present

## 2017-04-16 DIAGNOSIS — J439 Emphysema, unspecified: Secondary | ICD-10-CM | POA: Diagnosis present

## 2017-04-16 DIAGNOSIS — E162 Hypoglycemia, unspecified: Secondary | ICD-10-CM | POA: Diagnosis present

## 2017-04-16 DIAGNOSIS — H409 Unspecified glaucoma: Secondary | ICD-10-CM | POA: Diagnosis present

## 2017-04-16 DIAGNOSIS — R680 Hypothermia, not associated with low environmental temperature: Secondary | ICD-10-CM | POA: Diagnosis present

## 2017-04-16 DIAGNOSIS — R0603 Acute respiratory distress: Secondary | ICD-10-CM | POA: Diagnosis not present

## 2017-04-16 DIAGNOSIS — Z87891 Personal history of nicotine dependence: Secondary | ICD-10-CM

## 2017-04-16 DIAGNOSIS — C3490 Malignant neoplasm of unspecified part of unspecified bronchus or lung: Secondary | ICD-10-CM | POA: Diagnosis not present

## 2017-04-16 DIAGNOSIS — R64 Cachexia: Secondary | ICD-10-CM | POA: Diagnosis present

## 2017-04-16 DIAGNOSIS — N183 Chronic kidney disease, stage 3 (moderate): Secondary | ICD-10-CM | POA: Diagnosis present

## 2017-04-16 DIAGNOSIS — I481 Persistent atrial fibrillation: Principal | ICD-10-CM | POA: Diagnosis present

## 2017-04-16 DIAGNOSIS — R0602 Shortness of breath: Secondary | ICD-10-CM | POA: Diagnosis not present

## 2017-04-16 DIAGNOSIS — Z823 Family history of stroke: Secondary | ICD-10-CM

## 2017-04-16 DIAGNOSIS — M545 Low back pain, unspecified: Secondary | ICD-10-CM

## 2017-04-16 DIAGNOSIS — I130 Hypertensive heart and chronic kidney disease with heart failure and stage 1 through stage 4 chronic kidney disease, or unspecified chronic kidney disease: Secondary | ICD-10-CM | POA: Diagnosis present

## 2017-04-16 DIAGNOSIS — G40909 Epilepsy, unspecified, not intractable, without status epilepticus: Secondary | ICD-10-CM | POA: Diagnosis present

## 2017-04-16 DIAGNOSIS — I739 Peripheral vascular disease, unspecified: Secondary | ICD-10-CM | POA: Diagnosis present

## 2017-04-16 DIAGNOSIS — Z8673 Personal history of transient ischemic attack (TIA), and cerebral infarction without residual deficits: Secondary | ICD-10-CM | POA: Diagnosis not present

## 2017-04-16 DIAGNOSIS — I482 Chronic atrial fibrillation: Secondary | ICD-10-CM | POA: Diagnosis present

## 2017-04-16 DIAGNOSIS — Z8249 Family history of ischemic heart disease and other diseases of the circulatory system: Secondary | ICD-10-CM

## 2017-04-16 DIAGNOSIS — R918 Other nonspecific abnormal finding of lung field: Secondary | ICD-10-CM

## 2017-04-16 DIAGNOSIS — E43 Unspecified severe protein-calorie malnutrition: Secondary | ICD-10-CM | POA: Insufficient documentation

## 2017-04-16 DIAGNOSIS — I4819 Other persistent atrial fibrillation: Secondary | ICD-10-CM

## 2017-04-16 DIAGNOSIS — I959 Hypotension, unspecified: Secondary | ICD-10-CM | POA: Diagnosis present

## 2017-04-16 DIAGNOSIS — Z9111 Patient's noncompliance with dietary regimen: Secondary | ICD-10-CM

## 2017-04-16 DIAGNOSIS — R627 Adult failure to thrive: Secondary | ICD-10-CM | POA: Diagnosis present

## 2017-04-16 DIAGNOSIS — G8929 Other chronic pain: Secondary | ICD-10-CM | POA: Diagnosis not present

## 2017-04-16 LAB — CBC WITH DIFFERENTIAL/PLATELET
Basophils Absolute: 0 10*3/uL (ref 0.0–0.1)
Basophils Relative: 1 %
EOS PCT: 1 %
Eosinophils Absolute: 0 10*3/uL (ref 0.0–0.7)
HEMATOCRIT: 35 % — AB (ref 36.0–46.0)
Hemoglobin: 10.8 g/dL — ABNORMAL LOW (ref 12.0–15.0)
LYMPHS ABS: 1.8 10*3/uL (ref 0.7–4.0)
Lymphocytes Relative: 33 %
MCH: 25 pg — AB (ref 26.0–34.0)
MCHC: 30.9 g/dL (ref 30.0–36.0)
MCV: 81 fL (ref 78.0–100.0)
MONO ABS: 0.2 10*3/uL (ref 0.1–1.0)
MONOS PCT: 3 %
NEUTROS ABS: 3.3 10*3/uL (ref 1.7–7.7)
Neutrophils Relative %: 62 %
PLATELETS: 149 10*3/uL — AB (ref 150–400)
RBC: 4.32 MIL/uL (ref 3.87–5.11)
RDW: 19 % — AB (ref 11.5–15.5)
WBC: 5.3 10*3/uL (ref 4.0–10.5)

## 2017-04-16 LAB — COMPREHENSIVE METABOLIC PANEL
ALBUMIN: 3.6 g/dL (ref 3.5–5.0)
ALK PHOS: 76 U/L (ref 38–126)
ALT: 30 U/L (ref 14–54)
AST: 75 U/L — AB (ref 15–41)
Anion gap: 17 — ABNORMAL HIGH (ref 5–15)
BILIRUBIN TOTAL: 1.8 mg/dL — AB (ref 0.3–1.2)
BUN: 51 mg/dL — AB (ref 6–20)
CALCIUM: 8.8 mg/dL — AB (ref 8.9–10.3)
CO2: 22 mmol/L (ref 22–32)
Chloride: 101 mmol/L (ref 101–111)
Creatinine, Ser: 2.14 mg/dL — ABNORMAL HIGH (ref 0.44–1.00)
GFR calc Af Amer: 28 mL/min — ABNORMAL LOW (ref >60)
GFR calc non Af Amer: 24 mL/min — ABNORMAL LOW (ref >60)
GLUCOSE: 194 mg/dL — AB (ref 65–99)
Potassium: 5.3 mmol/L — ABNORMAL HIGH (ref 3.5–5.1)
Sodium: 140 mmol/L (ref 135–145)
TOTAL PROTEIN: 6.6 g/dL (ref 6.5–8.1)

## 2017-04-16 LAB — TROPONIN I: Troponin I: 0.03 ng/mL (ref <0.03)

## 2017-04-16 LAB — URINALYSIS, ROUTINE W REFLEX MICROSCOPIC
Bilirubin Urine: NEGATIVE
Glucose, UA: NEGATIVE mg/dL
Hgb urine dipstick: NEGATIVE
Ketones, ur: NEGATIVE mg/dL
LEUKOCYTES UA: NEGATIVE
NITRITE: NEGATIVE
PH: 5 (ref 5.0–8.0)
Protein, ur: NEGATIVE mg/dL
SPECIFIC GRAVITY, URINE: 1.012 (ref 1.005–1.030)

## 2017-04-16 LAB — CBG MONITORING, ED
GLUCOSE-CAPILLARY: 127 mg/dL — AB (ref 65–99)
Glucose-Capillary: 181 mg/dL — ABNORMAL HIGH (ref 65–99)
Glucose-Capillary: 181 mg/dL — ABNORMAL HIGH (ref 65–99)
Glucose-Capillary: 115 mg/dL — ABNORMAL HIGH (ref 65–99)

## 2017-04-16 LAB — GLUCOSE, CAPILLARY: GLUCOSE-CAPILLARY: 132 mg/dL — AB (ref 65–99)

## 2017-04-16 LAB — I-STAT CG4 LACTIC ACID, ED
Lactic Acid, Venous: 5.52 mmol/L (ref 0.5–1.9)
Lactic Acid, Venous: 2.11 mmol/L (ref 0.5–1.9)

## 2017-04-16 MED ORDER — AMIODARONE HCL 200 MG PO TABS
200.0000 mg | ORAL_TABLET | Freq: Every day | ORAL | Status: DC
  Administered 2017-04-17: 200 mg via ORAL
  Filled 2017-04-16 (×2): qty 1

## 2017-04-16 MED ORDER — ASPIRIN EC 81 MG PO TBEC
81.0000 mg | DELAYED_RELEASE_TABLET | Freq: Every day | ORAL | Status: DC
  Administered 2017-04-17 – 2017-04-20 (×4): 81 mg via ORAL
  Filled 2017-04-16 (×4): qty 1

## 2017-04-16 MED ORDER — FUROSEMIDE 10 MG/ML IJ SOLN
20.0000 mg | Freq: Once | INTRAMUSCULAR | Status: AC
  Administered 2017-04-17: 20 mg via INTRAVENOUS
  Filled 2017-04-16: qty 2

## 2017-04-16 MED ORDER — DEXTROSE 50 % IV SOLN
INTRAVENOUS | Status: AC
  Filled 2017-04-16: qty 50

## 2017-04-16 MED ORDER — SODIUM CHLORIDE 0.9 % IV BOLUS (SEPSIS)
1000.0000 mL | Freq: Once | INTRAVENOUS | Status: AC
  Administered 2017-04-16: 1000 mL via INTRAVENOUS

## 2017-04-16 MED ORDER — DONEPEZIL HCL 10 MG PO TABS
10.0000 mg | ORAL_TABLET | Freq: Every day | ORAL | Status: DC
  Administered 2017-04-17 – 2017-04-19 (×4): 10 mg via ORAL
  Filled 2017-04-16 (×5): qty 1

## 2017-04-16 MED ORDER — ACETAMINOPHEN 650 MG RE SUPP: 650.0000 mg | Freq: Four times a day (QID) | RECTAL | Status: DC | PRN

## 2017-04-16 MED ORDER — SODIUM CHLORIDE 0.9 % IV BOLUS (SEPSIS)
500.0000 mL | Freq: Once | INTRAVENOUS | Status: AC
  Administered 2017-04-16: 500 mL via INTRAVENOUS

## 2017-04-16 MED ORDER — ACETAMINOPHEN 325 MG PO TABS: 650.0000 mg | ORAL_TABLET | Freq: Four times a day (QID) | ORAL | Status: DC | PRN

## 2017-04-16 MED ORDER — ENOXAPARIN SODIUM 30 MG/0.3ML ~~LOC~~ SOLN
30.0000 mg | SUBCUTANEOUS | Status: DC
  Administered 2017-04-18 – 2017-04-20 (×3): 30 mg via SUBCUTANEOUS
  Filled 2017-04-16 (×4): qty 0.3

## 2017-04-16 MED ORDER — SODIUM CHLORIDE 0.9 % IV SOLN
INTRAVENOUS | Status: DC
  Administered 2017-04-16 (×2): via INTRAVENOUS

## 2017-04-16 MED ORDER — MEMANTINE HCL 10 MG PO TABS
10.0000 mg | ORAL_TABLET | Freq: Two times a day (BID) | ORAL | Status: DC
  Administered 2017-04-17 – 2017-04-18 (×4): 10 mg via ORAL
  Filled 2017-04-16 (×4): qty 1

## 2017-04-16 MED ORDER — VANCOMYCIN HCL IN DEXTROSE 1-5 GM/200ML-% IV SOLN
1000.0000 mg | Freq: Once | INTRAVENOUS | Status: AC
  Administered 2017-04-16: 1000 mg via INTRAVENOUS
  Filled 2017-04-16: qty 200

## 2017-04-16 MED ORDER — LATANOPROST 0.005 % OP SOLN
1.0000 [drp] | Freq: Every day | OPHTHALMIC | Status: DC
  Administered 2017-04-17 – 2017-04-19 (×3): 1 [drp] via OPHTHALMIC
  Filled 2017-04-16: qty 2.5

## 2017-04-16 MED ORDER — LAMOTRIGINE 100 MG PO TABS
100.0000 mg | ORAL_TABLET | Freq: Two times a day (BID) | ORAL | Status: DC
  Administered 2017-04-17 – 2017-04-20 (×8): 100 mg via ORAL
  Filled 2017-04-16 (×8): qty 1

## 2017-04-16 MED ORDER — SODIUM CHLORIDE 0.9 % IV SOLN: INTRAVENOUS | Status: DC

## 2017-04-16 MED ORDER — SODIUM CHLORIDE 0.9 % IV BOLUS (SEPSIS)
250.0000 mL | Freq: Once | INTRAVENOUS | Status: AC
  Administered 2017-04-16: 250 mL via INTRAVENOUS

## 2017-04-16 MED ORDER — SODIUM CHLORIDE 0.9 % IV SOLN
500.0000 mg | INTRAVENOUS | Status: DC
  Administered 2017-04-18: 500 mg via INTRAVENOUS
  Filled 2017-04-16: qty 500

## 2017-04-16 MED ORDER — PIPERACILLIN-TAZOBACTAM 3.375 G IVPB 30 MIN
3.3750 g | Freq: Once | INTRAVENOUS | Status: AC
  Administered 2017-04-16: 3.375 g via INTRAVENOUS
  Filled 2017-04-16: qty 50

## 2017-04-16 MED ORDER — PIPERACILLIN-TAZOBACTAM IN DEX 2-0.25 GM/50ML IV SOLN
2.2500 g | Freq: Three times a day (TID) | INTRAVENOUS | Status: DC
  Administered 2017-04-16 – 2017-04-18 (×6): 2.25 g via INTRAVENOUS
  Filled 2017-04-16 (×7): qty 50

## 2017-04-16 MED ORDER — QUETIAPINE FUMARATE 25 MG PO TABS
12.5000 mg | ORAL_TABLET | Freq: Every evening | ORAL | Status: DC
  Administered 2017-04-17 (×2): 12.5 mg via ORAL
  Filled 2017-04-16 (×2): qty 1

## 2017-04-16 MED ORDER — LEVETIRACETAM 750 MG PO TABS
750.0000 mg | ORAL_TABLET | Freq: Two times a day (BID) | ORAL | Status: DC
  Administered 2017-04-17 – 2017-04-20 (×8): 750 mg via ORAL
  Filled 2017-04-16 (×8): qty 1

## 2017-04-16 MED ORDER — DEXTROSE 50 % IV SOLN
1.0000 | Freq: Once | INTRAVENOUS | Status: AC
  Administered 2017-04-16: 50 mL via INTRAVENOUS

## 2017-04-16 NOTE — ED Notes (Signed)
Attempted to call report

## 2017-04-16 NOTE — ED Triage Notes (Signed)
[facility]- pt coming from MD office. She was there for well visit, became weak and clammy. Pt has dementia at baseline. Pt alert on arrival, cbg with EMS was 79. Glucagon administered. No IV access available, vitals stable.

## 2017-04-16 NOTE — ED Notes (Signed)
Bear hugger, warming blanket removed.

## 2017-04-16 NOTE — ED Provider Notes (Addendum)
[facility] DEPT Provider Note   CSN: [id number] Arrival date & time: 04/16/17  1037     History   Chief Complaint Chief Complaint  Patient presents with  . Hypoglycemia  . Weakness    HPI Meliss [name] is a 61 y.o. female.  61 year old female presents from her nephrologist office due to altered mental status. Patient does have a history of dementia at baseline. EMS was called and patient's CBG was 46 and she was given glucagon. According to EMS, she was therefore a follow-up visit due to her worsening renal disease. Patient is predialysis at this time. Patient became more responsive after she had received the glucagon and patient was transported here.      Past Medical History:  Diagnosis Date  . Atrial flutter ([place])   . COPD (chronic obstructive pulmonary disease) ([place])   . CVA (cerebral vascular accident) ([place])   . 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 Diastolic heart failure ([place])   . HTN (hypertension)   . PAD (peripheral artery disease) ([place])   . Pancreatitis   . Seizures [facility])     Patient Active Problem List   Diagnosis Date Noted  . CHF (congestive heart failure) ([place]) 01/22/2017  . Acute renal failure (ARF) ([place]) 01/22/2017  . Atrial fibrillation with RVR ([place]) 01/22/2017  . Acute renal failure with acute tubular necrosis superimposed on stage 3 chronic kidney disease ([place])   . Dementia 08/05/2016  . CKD (chronic kidney disease) stage 3, GFR 30-59 ml/min (HCC) 08/05/2016  . Seizures ([place]) 08/05/2016  . Acute on chronic diastolic heart failure ([place]) 08/04/2016  . Malnutrition of moderate degree 07/26/2016  . Atrial flutter ([place])   . AKI (acute kidney injury) ([place])   . Cardiogenic shock ([place]) 07/21/2016  . Syncope and collapse   . Encounter for central line care   . Hypotension     Past Surgical History:  Procedure Laterality Date  . CENTRAL LINE INSERTION  07/21/2016      . SHOULDER SURGERY Left     OB History    No data available        Home Medications    Prior to Admission medications   Medication Sig Start Date End Date Taking? Authorizing Provider  acetaminophen (TYLENOL) 500 MG tablet Take 1,000 mg by mouth 2 (two) times daily as needed (for various aches and pains).     [provider]  amiodarone (PACERONE) 200 MG tablet Take 1 tablet (200 mg total) by mouth daily. Start taking 07/30/15 07/26/16   Caren Griffins, MD  aspirin EC 325 MG tablet Take 325 mg by mouth daily.    [provider]  Cholecalciferol (VITAMIN D3) 2000 units TABS Take 2,000 Units by mouth daily.    [provider]  donepezil (ARICEPT) 10 MG tablet Take 10 mg by mouth at bedtime.    [provider]  furosemide (LASIX) 40 MG tablet Take 1 tablet (40 mg total) by mouth daily. Alternate 40 mg every other day wit 20 mg every other day 01/26/17 04/26/17  Bonnielee Haff, MD  lamoTRIgine (LAMICTAL) 100 MG tablet Take 100 mg by mouth 2 (two) times daily.    [provider]  latanoprost (XALATAN) 0.005 % ophthalmic solution Place 1 drop into both eyes at bedtime.    [provider]  levETIRAcetam (KEPPRA) 750 MG tablet Take 750 mg by mouth 2 (two) times daily.    [provider]  memantine (NAMENDA) 10 MG tablet Take 10 mg  by mouth 2 (two) times daily.    [provider]  metoprolol tartrate (LOPRESSOR) 25 MG tablet Take 1 tablet (25 mg total) by mouth 2 (two) times daily. 08/09/16   Eber Jones, MD  Pancrelipase, Lip-Prot-Amyl, (CREON) 24000-76000 units CPEP Take 1 capsule by mouth 3 (three) times daily with meals. FOR DIGESTION    [provider]  QUEtiapine (SEROQUEL) 25 MG tablet Take 12.5 mg by mouth every evening.    [provider]    Family History Family History  Problem Relation Age of Onset  . Hypertension Father   . Cancer Mother   . Cancer Child   . Cerebrovascular Accident Child   . Gout Child     Social History Social History   Substance Use Topics  . Smoking status: Former Research scientist (life sciences)  . Smokeless tobacco: Never Used  . Alcohol use Yes     Allergies   Patient has no known allergies.   Review of Systems Review of Systems  Unable to perform ROS: Acuity of condition     Physical Exam Updated Vital Signs BP 100/84 (BP Location: Right Arm)   Pulse (!) 117   Temp (!) 94.3 F (34.6 C) (Rectal)   Resp 17   SpO2 100%   Physical Exam  Constitutional: She appears lethargic. She appears cachectic.  Non-toxic appearance. She has a sickly appearance. She appears ill. No distress.  HENT:  Head: Normocephalic and atraumatic.  Eyes: Pupils are equal, round, and reactive to light. Conjunctivae, EOM and lids are normal.  Neck: Normal range of motion. Neck supple. No tracheal deviation present. No thyroid mass present.  Cardiovascular: Normal rate, regular rhythm and normal heart sounds.  Exam reveals no gallop.   No murmur heard. Pulmonary/Chest: Effort normal and breath sounds normal. No stridor. No respiratory distress. She has no decreased breath sounds. She has no wheezes. She has no rhonchi. She has no rales.  Abdominal: Soft. Normal appearance and bowel sounds are normal. She exhibits no distension. There is no tenderness. There is no rebound and no CVA tenderness.  Musculoskeletal: Normal range of motion. She exhibits no edema or tenderness.  Neurological: She appears lethargic. No cranial nerve deficit or sensory deficit. GCS eye subscore is 4. GCS verbal subscore is 4. GCS motor subscore is 6.  Skin: No abrasion and no rash noted. She is diaphoretic.  Psychiatric: She is slowed.  Nursing note and vitals reviewed.    ED Treatments / Results  Labs (all labs ordered are listed, but only abnormal results are displayed) Labs Reviewed  CBG MONITORING, ED - Abnormal; Notable for the following:       Result Value   Glucose-Capillary <10 (*)    All other components within normal limits  CBC WITH  DIFFERENTIAL/PLATELET  COMPREHENSIVE METABOLIC PANEL  TROPONIN I    EKG  EKG Interpretation  Date/Time:  Monday April 16 2017 10:39:52 EDT Ventricular Rate:  104 PR Interval:    QRS Duration: 102 QT Interval:  371 QTC Calculation: 472 R Axis:   176 Text Interpretation:  Atrial flutter Ventricular premature complex Right axis deviation Consider left ventricular hypertrophy No significant change since last tracing Confirmed by Lacretia Leigh ([id number]) on 04/16/2017 10:58:18 AM       Radiology No results found.  Procedures Procedures (including critical care time)  Medications Ordered in ED Medications  0.9 %  sodium chloride infusion (not administered)  dextrose 50 % solution 50 mL (50 mLs Intravenous Given 04/16/17 1048)  Initial Impression / Assessment and Plan / ED Course  I have reviewed the triage vital signs and the nursing notes.  Pertinent labs & imaging results that were available during my care of the patient were reviewed by me and considered in my medical decision making (see chart for details).    CRITICAL CARE Performed by: Leota Jacobsen Total critical care time: 60 minutes Critical care time was exclusive of separately billable procedures and treating other patients. Critical care was necessary to treat or prevent imminent or life-threatening deterioration. Critical care was time spent personally by me on the following activities: development of treatment plan with patient and/or surrogate as well as nursing, discussions with consultants, evaluation of patient's response to treatment, examination of patient, obtaining history from patient or surrogate, ordering and performing treatments and interventions, ordering and review of laboratory studies, ordering and review of radiographic studies, pulse oximetry and re-evaluation of patient's condition.  Patient was hyperglycemic here and given 1 amp of D50. She is also hypothermic and placed on the bear  hugger. Lactate was severely elevated and was given IV fluids along with antibiotics. Chest x-ray without evidence of infection. She is reassessed multiple times and is much more alert. Will be admitted to the medicine service  Final Clinical Impressions(s) / ED Diagnoses   Final diagnoses:  None    New Prescriptions New Prescriptions   No medications on file     Lacretia Leigh, MD 04/16/17 1452    Lacretia Leigh, MD 04/16/17 1453

## 2017-04-16 NOTE — ED Notes (Signed)
Dinner tray ordered for patient.

## 2017-04-16 NOTE — Progress Notes (Signed)
Pt arrived to [facility]. Alert and oriented x 3 disoriented to time. Pt identified appropriately, telemetry box and continuous pulse oxymetry placed, VS stable. Pt oriented to room and equipment, bed alarm in place, instructed to use call bell for assistance and call bell left within reach. Pt in no distress, will continue to monitor and treat pt per MD orders.

## 2017-04-16 NOTE — H&P (Signed)
Brittany [name] Admission History and Physical Service Pager: [phone]  Patient name: Brittany [name] Medical record number: [id number] Date of birth: August 20, 1955 Age: 61 y.o. Gender: female  Primary Care Provider: System, Pcp Not In Consultants: None Code Status: Full  Chief Complaint: Hypoglycemia, hypothermia and weakness   Assessment and Plan: Brittany [name] is a 61 y.o. female presenting with hypoglycemia, hypothermia and weakness . PMH is significant for frequent falls, chronic diastolic heart failure, persistent atrial fibrillation/flutter, CKD3, HTN, seizure, dementia, COPD, and CVA.  Sepsis presentation: On initial, patient was tachypneic 24, tachycardic 117, hypothermic 94.3 F, hypotensive 90/75, lactic acid 5.52 with altered mental status consistent with SIRS. He was also hypoglycemic 47 initially receiving glucagon.  She received a 1.5L normal saline bolus in the ED as well as dextrose. CXR showed change right mid and lower lung airspace opacity with adjacent pleural effusion and stable cardiomegaly with minimal concern for pneumonia. Patient was started on broad-spectrum antibiotics. UA was pending however patient denied any signs of dysuria, frequency, hesitancy. No leukocytosis noted on CBC making infectious process or septic presentation less likely. --Admit patient to have MTS, admitting physician Brittany [name] - Continue vancomycin 3.375 g IV - Continue zosyn 2.25 g IV - 184m/hr NS - trend lactic acid -troponins 3 -procalcitonin -UA -Fever curve -Follow up on blood cultures  Diastolic CHF: no edema in lower extremities, diffuse crackle bilaterally on lung exam.  Son says she has had consistent problems with salt/fluid intake in the past.  BNP 1821.  Last echo 7/18 showed 50-55%EF. Patient was recently (8/09) seen by cardiologist Brittany [name] had a BNP of 1800 seems to be her baseline. Patient has a history of uncontrolled salt  consumption. Lasix was recently increased to 40 mg daily  and metoprolol 37.5 mg twice a day. -Start patient on 20 mg IV furosemide once -We will restart home dose prior to discharge - daily weights -Monitor I's and O's  Acute on chronic  kidney disease stage III: still making urine, follows with urology. Patient is pre-dialysis. Creatinine 2.14 above baseline of 1.5-1.98 earlier this year. Elevated creatinine likely secondary to hypovolemia versus worsening kidney function -strict I/O -Follow-up on a.m. BMP -furosemide  Afib/A flutter- patient followed by cardiology. Patient is rate controlled with amiodarone and metoprolol. Patient is currently not on anticoagulation due to increased risk of bleeding with history of frequent falls. Patient has multiple afib treatments in their past medical record.  HR was consistently irregular on exam by multiple providers. EKG on admission showed atrial fibrillation/atrial flutter. -Resume amiodarone 200 mg daily -Hold metoprolol in the setting of low BP, will resume when an appropriate  AMS/Dementia- AOx2 (not to year), able to answer a number of questions about past but there are evident deficits. Patient is altered at baseline and her son states over the phone that she has good days and bad days.   Appears to be significantly improved at admission exam from EMS presentation. Could be hypoglycemia (47) on admission as mental status seemed to improved with dextrose.  Possible septic etiology as patient met SIRS criteria with qsofa 2 on admission.  Current presentation likely multifactorial in etiology - UA pending - CBGs with supplementation of dextrose as needed - UDS  - home seroquel - home memantine - home donepezil -ECG/cardiac monitor  Back/flank pain- patient describes pain as "in her kidneys" but has no CVA tenderness. She located pain to lumbar region between spine and PSIS on R side.  Distal sensation and motor strength intact.  Denies urinary  symptoms but is demented so UTI/pyelo is still possible. Age and slight frame could be risk of compression fracture or pathologic fracture although the patient reports no recent fall. -Order lumbar spine XR -Follow-up on urinalysis  -Acetaminophen 650 mg every 6 when necessary  Hypoglycemia: Blood glucose 47 on admission. Unclear etiology as patient is not on insulin therapy. No seizure activity noted with hypoglycemia. Patient received glucagon quick correction. -Will continue to monitor -routine CBGs  Pancreatitis History of pancreatitis on Creon will hold while the patient. Will resume  when appropriate  Seizure disorder Appears to be well controlled on home medication. No seizure activity reported in the setting of hypoglycemia. Will continue home regimen. --Lamictal 100 mg twice a day --Keppra 700 mg twice a day  Glaucoma -Continue latanoprost drops   FEN/GI: Heart healthy diet, pantoprazole,  Prophylaxis: lovenox/aspirin 59m  Disposition: Home pending clinical improvement  History of Present Illness:  Brittany Clementsis a 61y.o. female who is presenting for evaluation of weakness/SOB/AMS after a visit with her nephrologist. Patient states she is unsure why she is here and is not sure what happened prior to admission. She thinks her son brought her to the ED for evaluation. The patient reports an episode of diarrhea today in the [name]. Patient states pain on her right back and recent subjective weight loss. She denies any increased confusion, sickness, recent falls. She denies difficulty moving her legs, pain in legs, headahces, night sweats, loss of appetite, dizziness, chest pain, change in vision, palpitations, nausea, fever, trouble with voiding or stooling, frequency, abdominal pain, hematuria, dysuria, abnormal smell with urine, SOB, rash, or LE swelling. Patient lives with her son and daughter in law. Her son is her current caretaker. Of note, she denies any ill contacts.  Patient feels safe and happy at home.   She is oriented to person place but not time.  Patient reports past tobacco use, but quit. She denies alcohol use or illicit drug use.   Review Of Systems: Per HPI with the following additions:   Review of Systems  Constitutional: Positive for weight loss. Negative for chills and fever.  Eyes: Negative for blurred vision and double vision.  Respiratory: Negative for cough and shortness of breath.   Cardiovascular: Negative for chest pain, palpitations and leg swelling.  Gastrointestinal: Positive for diarrhea. Negative for abdominal pain, blood in stool, nausea and vomiting.  Genitourinary: Negative for dysuria, frequency, hematuria and urgency.  Musculoskeletal: Positive for back pain. Negative for falls.       Right mid back pain  Neurological: Negative for dizziness and headaches.  All other systems reviewed and are negative.   Patient Active Problem List   Diagnosis Date Noted  . CHF (congestive heart failure) ([place] 01/22/2017  . Acute renal failure (ARF) ([facility] 01/22/2017  . Atrial fibrillation with RVR ([place] 01/22/2017  . Acute renal failure with acute tubular necrosis superimposed on stage 3 chronic kidney disease ([place]   . Dementia 08/05/2016  . CKD (chronic kidney disease) stage 3, GFR 30-59 ml/min (HCC) 08/05/2016  . Seizures ([place] 08/05/2016  . Acute on chronic diastolic heart failure ([facility] 08/04/2016  . Malnutrition of moderate degree 07/26/2016  . Atrial flutter ([place]   . AKI (acute kidney injury) ([place]   . Cardiogenic shock ([place] 07/21/2016  . Syncope and collapse   . Encounter for central line care   . Hypotension     Past Medical History: Past Medical  History:  Diagnosis Date  . Atrial flutter ([place])   . COPD (chronic obstructive pulmonary disease) ([place])   . CVA (cerebral vascular accident) ([place])   . 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 Diastolic heart failure ([place])   . HTN (hypertension)   . PAD (peripheral artery  disease) ([place])   . Pancreatitis   . Seizures ([place])     Past Surgical History: Past Surgical History:  Procedure Laterality Date  . CENTRAL LINE INSERTION  07/21/2016      . SHOULDER SURGERY Left     Social History: Social History  Substance Use Topics  . Smoking status: Former Research scientist (life sciences)  . Smokeless tobacco: Never Used  . Alcohol use Yes   Additional social history: lives with son/daughter in law and granddaughters  Please also refer to relevant sections of EMR.  Family History: Family History  Problem Relation Age of Onset  . Hypertension Father   . Cancer Mother   . Cancer Child   . Cerebrovascular Accident Child   . Gout Child    (If not completed, MUST add something in)  Allergies and Medications: No Known Allergies No current facility-administered medications on file prior to encounter.    Current Outpatient Prescriptions on File Prior to Encounter  Medication Sig Dispense Refill  . acetaminophen (TYLENOL) 500 MG tablet Take 1,000 mg by mouth 2 (two) times daily as needed (for various aches and pains).     Marland Kitchen amiodarone (PACERONE) 200 MG tablet Take 1 tablet (200 mg total) by mouth daily. Start taking 07/30/15 30 tablet 1  . aspirin EC 325 MG tablet Take 325 mg by mouth daily.    . Cholecalciferol (VITAMIN D3) 2000 units TABS Take 2,000 Units by mouth daily.    Marland Kitchen donepezil (ARICEPT) 10 MG tablet Take 10 mg by mouth at bedtime.    . furosemide (LASIX) 40 MG tablet Take 1 tablet (40 mg total) by mouth daily. Alternate 40 mg every other day wit 20 mg every other day 30 tablet 1  . lamoTRIgine (LAMICTAL) 100 MG tablet Take 100 mg by mouth 2 (two) times daily.    Marland Kitchen latanoprost (XALATAN) 0.005 % ophthalmic solution Place 1 drop into both eyes at bedtime.    . levETIRAcetam (KEPPRA) 750 MG tablet Take 750 mg by mouth 2 (two) times daily.    . memantine (NAMENDA) 10 MG tablet Take 10 mg by mouth 2 (two) times daily.    . metoprolol tartrate (LOPRESSOR) 25 MG tablet Take 1  tablet (25 mg total) by mouth 2 (two) times daily. 60 tablet 0  . Pancrelipase, Lip-Prot-Amyl, (CREON) 24000-76000 units CPEP Take 1 capsule by mouth 3 (three) times daily with meals. FOR DIGESTION    . QUEtiapine (SEROQUEL) 25 MG tablet Take 12.5 mg by mouth every evening.      Objective: BP 110/87   Pulse 95   Temp (!) 94.3 F (34.6 C) (Rectal)   Resp 20   Ht 5' 3"  (1.6 m)   Wt 92 lb 2.4 oz (41.8 kg)   SpO2 97%   BMI 16.32 kg/m  Exam: General: frail but alert woman, personable and interactive ENTM: R sided buccal lesions, white with no indication of bleeding (not painful to patient)  Neck: FROM, no complaint of neck pain Cardiovascular: irregular rate/rhythm, no edema in LE bilaterally Respiratory: crackles throughout lung fields bilaterally, no visible IWB Gastrointestinal: no complaint fo belly pain on palpation, aortic pulse palpable, bowel sounds present MSK: no deficits noted while  laying down, can lift/move arms/legs at will Neuro: some speech slurring, strength and sensation grossly intact bilaterally upper and lower extremity  Psych: AOx2 (not time), pleasant/interactive  Labs and Imaging: CBC BMET   Recent Labs Lab 04/16/17 1125  WBC 5.3  HGB 10.8*  HCT 35.0*  PLT 149*    Recent Labs Lab 04/16/17 1125  NA 140  K 5.3*  CL 101  CO2 22  BUN 51*  CREATININE 2.14*  GLUCOSE 194*  CALCIUM 8.8*     Dg Chest Port 1 View  Result Date: 04/16/2017 CLINICAL DATA:  Shortness of breath. EXAM: PORTABLE CHEST 1 VIEW COMPARISON:  Chest x-ray dated March 23, 2017. FINDINGS: Stable cardiomegaly. The lungs remain hyperinflated. Unchanged opacity in the right mid and lower lung with adjacent layering pleural fluid in the fissures. No pneumothorax. No acute osseous abnormality. IMPRESSION: 1. Unchanged right mid and lower lung airspace opacity and adjacent pleural effusion. 2. Stable cardiomegaly. Electronically Signed   By: Titus Dubin M.D.   On: 04/16/2017 11:07      I have seen and evaluated the patient with Dr. Criss Rosales. I am in agreement with the note above in its revised form. My additions are in blue.  Marjie Skiff, MD Family Medicine, PGY-2  Sherene Sires, DO 04/16/2017, 3:10 PM PGY-1, [place] Intern pager: [phone], text pages welcome

## 2017-04-16 NOTE — ED Notes (Addendum)
Attempted second IV site, unsuccessful. Phlebotomy will collect outstanding labs.

## 2017-04-16 NOTE — ED Notes (Signed)
Pt refused phlebotomy to draw labs

## 2017-04-16 NOTE — Progress Notes (Signed)
Pharmacy Antibiotic Note  Brittany [name] is a 61 y.o. female admitted on 04/16/2017 with sepsis.  Pharmacy has been consulted for vancomycin and zosyn dosing. Pt is hypothermic and lactic acid is elevated at 5.52. WBC is WNL and SCr is elevated at 2.14.   Plan: Vancomycin 1gm then 500mg  IV Q48H Zosyn 3.375gm IV x 1 then 2.25gm IV Q8H F/u renal fxn, C&S, clinical status and trough at SS  Height: 5\' 3"  (160 cm) Weight: 92 lb 2.4 oz (41.8 kg) IBW/kg (Calculated) : 52.4  Temp (24hrs), Avg:94.3 F (34.6 C), Min:94.3 F (34.6 C), Max:94.3 F (34.6 C)   Recent Labs Lab 04/16/17 1138  LATICACIDVEN 5.52*    CrCl cannot be calculated (Patient's most recent lab result is older than the maximum 21 days allowed.).    No Known Allergies  Antimicrobials this admission: Vanc 10/1>> Zosyn 10/1>>  Dose adjustments this admission: N/A  Microbiology results: Pending  Thank you for allowing pharmacy to be a part of this patient's care.  Brittany [name], Rande Lawman 04/16/2017 11:44 AM

## 2017-04-16 NOTE — ED Notes (Signed)
Pt CBG is 115. Nurse notified

## 2017-04-17 ENCOUNTER — Inpatient Hospital Stay (HOSPITAL_COMMUNITY): Payer: Medicare (Managed Care)

## 2017-04-17 DIAGNOSIS — F0280 Dementia in other diseases classified elsewhere without behavioral disturbance: Secondary | ICD-10-CM

## 2017-04-17 DIAGNOSIS — R0600 Dyspnea, unspecified: Secondary | ICD-10-CM

## 2017-04-17 DIAGNOSIS — R918 Other nonspecific abnormal finding of lung field: Secondary | ICD-10-CM

## 2017-04-17 DIAGNOSIS — M545 Low back pain, unspecified: Secondary | ICD-10-CM

## 2017-04-17 DIAGNOSIS — E162 Hypoglycemia, unspecified: Secondary | ICD-10-CM

## 2017-04-17 DIAGNOSIS — G3183 Dementia with Lewy bodies: Secondary | ICD-10-CM

## 2017-04-17 LAB — GLUCOSE, CAPILLARY
Glucose-Capillary: 117 mg/dL — ABNORMAL HIGH (ref 65–99)
Glucose-Capillary: 124 mg/dL — ABNORMAL HIGH (ref 65–99)
Glucose-Capillary: 127 mg/dL — ABNORMAL HIGH (ref 65–99)

## 2017-04-17 LAB — CBC
HCT: 40 % (ref 36.0–46.0)
Hemoglobin: 11.8 g/dL — ABNORMAL LOW (ref 12.0–15.0)
MCH: 23.7 pg — ABNORMAL LOW (ref 26.0–34.0)
MCHC: 29.5 g/dL — AB (ref 30.0–36.0)
MCV: 80.5 fL (ref 78.0–100.0)
PLATELETS: 147 10*3/uL — AB (ref 150–400)
RBC: 4.97 MIL/uL (ref 3.87–5.11)
RDW: 18.8 % — AB (ref 11.5–15.5)
WBC: 4.1 10*3/uL (ref 4.0–10.5)

## 2017-04-17 LAB — BASIC METABOLIC PANEL
ANION GAP: 16 — AB (ref 5–15)
BUN: 49 mg/dL — AB (ref 6–20)
CALCIUM: 8.9 mg/dL (ref 8.9–10.3)
CO2: 22 mmol/L (ref 22–32)
CREATININE: 2.07 mg/dL — AB (ref 0.44–1.00)
Chloride: 103 mmol/L (ref 101–111)
GFR calc Af Amer: 29 mL/min — ABNORMAL LOW (ref >60)
GFR, EST NON AFRICAN AMERICAN: 25 mL/min — AB (ref >60)
GLUCOSE: 146 mg/dL — AB (ref 65–99)
Potassium: 3.8 mmol/L (ref 3.5–5.1)
Sodium: 141 mmol/L (ref 135–145)

## 2017-04-17 LAB — PROCALCITONIN: PROCALCITONIN: 2.77 ng/mL

## 2017-04-17 LAB — PROTIME-INR
INR: 1.33
PROTHROMBIN TIME: 16.4 s — AB (ref 11.4–15.2)

## 2017-04-17 LAB — APTT: aPTT: 24 seconds (ref 24–36)

## 2017-04-17 LAB — TROPONIN I: Troponin I: 0.04 ng/mL (ref <0.03)

## 2017-04-17 MED ORDER — FUROSEMIDE 10 MG/ML IJ SOLN
INTRAMUSCULAR | Status: AC
  Filled 2017-04-17: qty 2

## 2017-04-17 MED ORDER — FUROSEMIDE 10 MG/ML IJ SOLN
20.0000 mg | Freq: Once | INTRAMUSCULAR | Status: AC
  Administered 2017-04-17: 20 mg via INTRAVENOUS
  Filled 2017-04-17: qty 2

## 2017-04-17 MED ORDER — FUROSEMIDE 10 MG/ML IJ SOLN
20.0000 mg | Freq: Once | INTRAMUSCULAR | Status: AC
  Administered 2017-04-17: 20 mg via INTRAVENOUS

## 2017-04-17 MED ORDER — ORAL CARE MOUTH RINSE
15.0000 mL | Freq: Two times a day (BID) | OROMUCOSAL | Status: DC
  Administered 2017-04-17 – 2017-04-20 (×6): 15 mL via OROMUCOSAL

## 2017-04-17 MED ORDER — BOOST / RESOURCE BREEZE PO LIQD
1.0000 | Freq: Three times a day (TID) | ORAL | Status: DC
  Administered 2017-04-17 – 2017-04-18 (×5): 1 via ORAL

## 2017-04-17 NOTE — Progress Notes (Signed)
Pt troponin increased from 0.03 to 0.04. MD notified.

## 2017-04-17 NOTE — Progress Notes (Addendum)
MD at the bedside. New orders for chest x ray and additional dose of lasix place.

## 2017-04-17 NOTE — Progress Notes (Signed)
Pt c/o not being able to breat. Upon assessment pt respirations labored, crackles present on auscultation, pt in distress, unable to talk due to SOB. RR nurse notified, MD notified. Oxygen increased to 4 L nasal canula. Pt BP elevated, no fever, HR elevated in 120s, pt conscious, alert and oriented.

## 2017-04-17 NOTE — Progress Notes (Signed)
Pt c/o SOB, BP 144/117, HR in 120s, RR 20, SpO2 in low 90s on 3 L. Crackles increased comparing to previous. Pt has difficulty talking due to SOB. MD notified, will place order for Lasix IV.

## 2017-04-17 NOTE — Progress Notes (Signed)
Paged by RN about patient 's worsening shortness of breath. Went to evaluate patient who was short of breath with diffused crackles. Patient just received 20 mg IV lasix and was given multiple bolus of fluid in the ED due to initial sepsis presentation. Continuous IVF was stopped earlier in the evening to prevent volume overload given CHF. Patient is currently on 4L [place] and is maintaining good O2 sat. Will order repeat CXR though I expect pulmonary edema and add  20 IV lasix to increase diuresis. Will follow up on volume status. Patient currently on vanc zosyn.  Marjie Skiff, MD [place], PGY-2

## 2017-04-17 NOTE — Progress Notes (Signed)
Pt still SOB, VS 10/107, HR 119, RR 20, SpO2 94 on 4L. Pt urinated 200cc of urin after lasix was given. Pt asleep resting, labored respirations and crackles still present on auscultation, dyspnea with exertion. Chest x ray results available for review. MD notified about above. Instructions given to monitor pt and report changes in pt condition.

## 2017-04-17 NOTE — Progress Notes (Signed)
Per dr Criss Rosales pt can have 2 salt packets with meals to encourage oral intake

## 2017-04-17 NOTE — Progress Notes (Signed)
Family Medicine Teaching Service Daily Progress Note Intern Pager: [phone]  Patient name: Brittany Brittany [name]: [id number] Date of birth: 01-Jul-1956 Age: 61 y.o. Gender: Brittany [name]  Primary Care Provider: System, Pcp Not In Consultants:  Code Status: full  Pt Overview and Major Events to Date:  Brittany Brittany [name] is a 61 y.o. Brittany [name] presenting with hypoglycemia, hypothermia and weakness . PMH is significant for frequent falls, chronic diastolic heart failure, persistent atrial fibrillation/flutter, CKD3, HTN, seizure, dementia, COPD, and CVA.  Major events include respiratory decompensation evening of 10/1 which appears to have been fluid overload in pulmonary system addressed with lasix diuresis.  Assessment and Plan: Brittany Brittany [name] is a 61 y.o. Brittany [name] presenting with hypoglycemia, hypothermia and weakness . PMH is significant for frequent falls, chronic diastolic heart failure, persistent atrial fibrillation/flutter, CKD3, HTN, seizure, dementia, COPD, and CVA.  Sepsis presentation: On initial, patient was tachypneic 24, tachycardic 117, hypothermic 94.3 F, hypotensive 90/75, lactic acid 5.52 with altered mental status consistent with SIRS. He was also hypoglycemic 47 initially receiving glucagon.  She received a 1.5L normal saline bolus in the ED as well as dextrose. CXR showed change right mid and lower lung airspace opacity with adjacent pleural effusion and stable cardiomegaly with minimal concern for pneumonia. Patient was started on broad-spectrum antibiotics. UA was pending however patient denied any signs of dysuria, frequency, hesitancy. No leukocytosis noted on CBC making infectious process or septic presentation less likely. --Admit patient to have MTS, admitting physician Dr. Dorcas Mcmurray - Continue vancomycin 3.375 g IV - Continue zosyn 2.25 g IV - trend lactic acid -troponins 3 -procalcitonin 2.77 -UA - neg -Fever curve -Follow up on blood cultures  Respiratory  status: Rapid called 10/1 overnight due to respiratory distress.   IV fluids had been d/c'd prior out of concern for fluid overload and crackles in lungs.   On evaluation in room, patient had significantly increased crackles, IV lasix was administered and patient recovered.  Creatinine relatively stable at 2.07 with recent basline ~1.9 -hold IV fluids -O2 monitoring -discuss R lung chest xray findings with potentially chronic R sided opacity  Diastolic CHF: no edema in lower extremities, diffuse crackle bilaterally on lung exam.  Son says she has had consistent problems with salt/fluid intake in the past.  BNP 1821.  Last echo 7/18 showed 50-55%EF. Patient was recently (8/09) seen by cardiologist Dr. Kirk Ruths and had a BNP of 1800 seems to be her baseline. Patient has a history of uncontrolled salt consumption. Lasix was recently increased to 40 mg daily  and metoprolol 37.5 mg twice a day. -holding diuretics for now -We will restart home dose prior to discharge - daily weights -Monitor I's and O's  Acute on chronic  kidney disease stage III: still making urine, follows with urology. Patient is pre-dialysis. Creatinine 2.07 above baseline of 1.5-1.98 earlier this year. Elevated creatinine likely secondary to hypovolemia versus worsening kidney function -strict I/O -Follow-up on a.m. BMP -furosemide being given on single doses to balance nephro/resp concerns  Afib/A flutter- patient followed by cardiology. Patient is rate controlled with amiodarone and metoprolol. Patient is currently not on anticoagulation due to increased risk of bleeding with history of frequent falls. Patient has multiple afib treatments in their past medical record.  HR was consistently irregular on exam by multiple providers. EKG on admission showed atrial fibrillation/atrial flutter. -Resume amiodarone 200 mg daily -add metoprolol 25 BID (home dose)  AMS/Dementia- AOx2 (not to year), able to answer a Brittany [name] of  questions  about past but there are evident deficits. Patient is altered at baseline and her son states over the phone that she has good days and bad days.   Appears to be significantly improved at admission exam from EMS presentation. Could be hypoglycemia (47) on admission as mental status seemed to improved with dextrose.  Possible septic etiology as patient met SIRS criteria with qsofa 2 on admission.  Current presentation likely multifactorial in etiology - UA pending - CBGs with supplementation of dextrose as needed - UDS  - home seroquel - home memantine - home donepezil -ECG/cardiac monitor  Back/flank pain- patient describes pain as "in her kidneys" but has no CVA tenderness. She located pain to lumbar region between spine and PSIS on R side.  Distal sensation and motor strength intact.  Denies urinary symptoms but is demented so UTI/pyelo is still possible. Age and slight frame could be risk of compression fracture or pathologic fracture although the patient reports no recent fall. -Order lumbar spine XR -Follow-up on urinalysis  -Acetaminophen 650 mg q6prn  Hypoglycemia: Blood glucose 47 on admission. Unclear etiology as patient is not on insulin therapy. No seizure activity noted with hypoglycemia. Patient received glucagon quick correction. -Will continue to monitor -routine CBGs  Pancreatitis History of pancreatitis on Creon will hold while the patient. Will resume  when appropriate  Seizure disorder Appears to be well controlled on home medication. No seizure activity reported in the setting of hypoglycemia. Will continue home regimen. 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   Pain control- -tylenol   FEN/GI: Heart healthy diet, pantoprazole,  Prophylaxis: lovenox/aspirin 44m  Disposition: Home pending  clinical improvement  Subjective:  AOx2, said she was feeling better and everything was fine but accuracy is questionable due to mental state  Objective: Temp:  [94.3 F (34.6 C)-98.1 F (36.7 C)] 97.9 F (36.6 C) (10/02 0245) Pulse Rate:  [51-128] 115 (10/02 0852) Resp:  [12-27] 20 (10/02 0852) BP: (74-159)/(50-119) 136/96 (10/02 0852) SpO2:  [89 %-100 %] 90 % (10/02 0852) Weight:  [92 lb 2.4 oz (41.8 kg)] 92 lb 2.4 oz (41.8 kg) (10/01 1131) Physical Exam: General: frail but alert woman, personable and interactive ENTM: R sided buccal lesions, white with no indication of bleeding (not painful to patient)            Neck: FROM, no complaint of neck pain Cardiovascular: irregular rate/rhythm, no edema in LE bilaterally Respiratory: crackles throughout lung fields bilaterally, no visible IWB Gastrointestinal: no complaint fo belly pain on palpation, aortic pulse palpable, bowel sounds present MSK: no deficits noted while laying down, can lift/move arms/legs at will Neuro: some speech slurring, strength and sensation grossly intact bilaterally upper and lower extremity  Psych: AOx2 (not time), pleasant/interactive  Laboratory:  Recent Labs Lab 04/16/17 1125 04/17/17 0318  WBC 5.3 4.1  HGB 10.8* 11.8*  HCT 35.0* 40.0  PLT 149* 147*    Recent Labs Lab 04/16/17 1125 04/17/17 0318  NA 140 141  K 5.3* 3.8  CL 101 103  CO2 22 22  BUN 51* 49*  CREATININE 2.14* 2.07*  CALCIUM 8.8* 8.9  PROT 6.6  --   BILITOT 1.8*  --   ALKPHOS 76  --   ALT 30  --   AST 75*  --   GLUCOSE 194* 146*    procalcitonin 2.77  Imaging/Diagnostic Tests:  Dg Lumbar Spine 2-3 Views  Result Date: 04/16/2017 CLINICAL DATA:  Low back pain EXAM: LUMBAR SPINE - 2-3 VIEW COMPARISON:  CT 711 1,018 FINDINGS: Minimal scoliosis. Coarse right upper quadrant calcification may correspond to a liver calcification. Lumbar alignment within normal limits. Chronic superior endplate deformity at L2. Degenerative  changes at L5-S1. Remaining vertebral body heights are normal. Atherosclerotic calcifications of the aorta. Calcifications anterior to the spine, likely correspond to the pancreatic calcifications noted on the comparison CT. IMPRESSION: 1. Chronic superior endplate deformity at L2. Moderate degenerative changes at L5-S1 2. No acute osseous abnormality Electronically Signed   By: Donavan Foil M.D.   On: 04/16/2017 17:14   Portable Chest 1 View  Result Date: 04/17/2017 CLINICAL DATA:  Dyspnea EXAM: PORTABLE CHEST 1 VIEW COMPARISON:  04/16/2017 FINDINGS: Cardiac enlargement. Bilateral perihilar airspace disease is increasing since previous study suggesting increasing pneumonia or edema. Small bilateral pleural effusions also may be increasing. No pneumothorax. Tortuous aorta. Thoracolumbar scoliosis. IMPRESSION: Cardiac enlargement peer at progressing bilateral perihilar airspace disease and small bilateral pleural effusions. Electronically Signed   By: Lucienne Capers M.D.   On: 04/17/2017 02:44   Dg Chest Port 1 View  Result Date: 04/16/2017 CLINICAL DATA:  Shortness of breath. EXAM: PORTABLE CHEST 1 VIEW COMPARISON:  Chest x-ray dated March 23, 2017. FINDINGS: Stable cardiomegaly. The lungs remain hyperinflated. Unchanged opacity in the right mid and lower lung with adjacent layering pleural fluid in the fissures. No pneumothorax. No acute osseous abnormality. IMPRESSION: 1. Unchanged right mid and lower lung airspace opacity and adjacent pleural effusion. 2. Stable cardiomegaly. Electronically Signed   By: Titus Dubin M.D.   On: 04/16/2017 11:07     Sherene Sires, DO 04/17/2017, 9:09 AM PGY-1, [place] Intern pager: [phone], text pages welcome

## 2017-04-17 NOTE — Progress Notes (Signed)
Pt asleep, resting comfortably, VS improving, no signs of respiratory distress at this time.  Will continue to monitor pt.

## 2017-04-18 ENCOUNTER — Telehealth: Payer: Self-pay | Admitting: Pulmonary Disease

## 2017-04-18 DIAGNOSIS — I481 Persistent atrial fibrillation: Principal | ICD-10-CM

## 2017-04-18 DIAGNOSIS — R0602 Shortness of breath: Secondary | ICD-10-CM

## 2017-04-18 DIAGNOSIS — I4819 Other persistent atrial fibrillation: Secondary | ICD-10-CM

## 2017-04-18 DIAGNOSIS — C3490 Malignant neoplasm of unspecified part of unspecified bronchus or lung: Secondary | ICD-10-CM

## 2017-04-18 DIAGNOSIS — J90 Pleural effusion, not elsewhere classified: Secondary | ICD-10-CM

## 2017-04-18 DIAGNOSIS — I4892 Unspecified atrial flutter: Secondary | ICD-10-CM

## 2017-04-18 DIAGNOSIS — R0902 Hypoxemia: Secondary | ICD-10-CM

## 2017-04-18 DIAGNOSIS — A419 Sepsis, unspecified organism: Secondary | ICD-10-CM

## 2017-04-18 DIAGNOSIS — J189 Pneumonia, unspecified organism: Secondary | ICD-10-CM

## 2017-04-18 DIAGNOSIS — G8929 Other chronic pain: Secondary | ICD-10-CM

## 2017-04-18 DIAGNOSIS — E43 Unspecified severe protein-calorie malnutrition: Secondary | ICD-10-CM | POA: Insufficient documentation

## 2017-04-18 LAB — BASIC METABOLIC PANEL
Anion gap: 12 (ref 5–15)
BUN: 33 mg/dL — ABNORMAL HIGH (ref 6–20)
CALCIUM: 9 mg/dL (ref 8.9–10.3)
CO2: 28 mmol/L (ref 22–32)
Chloride: 101 mmol/L (ref 101–111)
Creatinine, Ser: 1.52 mg/dL — ABNORMAL HIGH (ref 0.44–1.00)
GFR calc Af Amer: 42 mL/min — ABNORMAL LOW (ref >60)
GFR calc non Af Amer: 36 mL/min — ABNORMAL LOW (ref >60)
GLUCOSE: 95 mg/dL (ref 65–99)
Potassium: 3.8 mmol/L (ref 3.5–5.1)
Sodium: 141 mmol/L (ref 135–145)

## 2017-04-18 LAB — LACTIC ACID, PLASMA: Lactic Acid, Venous: 1.5 mmol/L (ref 0.5–1.9)

## 2017-04-18 LAB — SEDIMENTATION RATE: SED RATE: 10 mm/h (ref 0–22)

## 2017-04-18 LAB — C-REACTIVE PROTEIN: CRP: 3.1 mg/dL — ABNORMAL HIGH (ref <1.0)

## 2017-04-18 MED ORDER — AMIODARONE HCL 200 MG PO TABS
200.0000 mg | ORAL_TABLET | Freq: Two times a day (BID) | ORAL | Status: DC
  Administered 2017-04-18 – 2017-04-20 (×4): 200 mg via ORAL
  Filled 2017-04-18 (×4): qty 1

## 2017-04-18 MED ORDER — DIGOXIN 0.25 MG/ML IJ SOLN
0.2500 mg | Freq: Four times a day (QID) | INTRAMUSCULAR | Status: AC
  Administered 2017-04-18 – 2017-04-19 (×2): 0.25 mg via INTRAVENOUS
  Filled 2017-04-18 (×2): qty 2

## 2017-04-18 MED ORDER — PIPERACILLIN-TAZOBACTAM 3.375 G IVPB
3.3750 g | Freq: Three times a day (TID) | INTRAVENOUS | Status: DC
  Administered 2017-04-18 – 2017-04-19 (×2): 3.375 g via INTRAVENOUS
  Filled 2017-04-18 (×3): qty 50

## 2017-04-18 MED ORDER — METOPROLOL TARTRATE 12.5 MG HALF TABLET
12.5000 mg | ORAL_TABLET | Freq: Two times a day (BID) | ORAL | Status: DC
  Filled 2017-04-18: qty 1

## 2017-04-18 MED ORDER — PANCRELIPASE (LIP-PROT-AMYL) 12000-38000 UNITS PO CPEP
12000.0000 [IU] | ORAL_CAPSULE | Freq: Three times a day (TID) | ORAL | Status: DC
  Administered 2017-04-18 – 2017-04-20 (×6): 12000 [IU] via ORAL
  Filled 2017-04-18 (×6): qty 1

## 2017-04-18 MED ORDER — METOPROLOL TARTRATE 25 MG PO TABS
25.0000 mg | ORAL_TABLET | Freq: Two times a day (BID) | ORAL | Status: DC
  Administered 2017-04-18: 25 mg via ORAL
  Filled 2017-04-18: qty 1

## 2017-04-18 MED ORDER — MEMANTINE HCL 10 MG PO TABS
5.0000 mg | ORAL_TABLET | Freq: Two times a day (BID) | ORAL | Status: DC
  Administered 2017-04-18 – 2017-04-20 (×4): 5 mg via ORAL
  Filled 2017-04-18 (×4): qty 1

## 2017-04-18 MED ORDER — VANCOMYCIN HCL 500 MG IV SOLR
500.0000 mg | INTRAVENOUS | Status: DC
  Filled 2017-04-18: qty 500

## 2017-04-18 MED ORDER — ENSURE ENLIVE PO LIQD
237.0000 mL | Freq: Three times a day (TID) | ORAL | Status: DC
  Administered 2017-04-18 – 2017-04-20 (×6): 237 mL via ORAL
  Filled 2017-04-18 (×2): qty 237

## 2017-04-18 MED ORDER — SODIUM CHLORIDE 0.9 % IV SOLN
INTRAVENOUS | Status: AC
  Administered 2017-04-18: 16:00:00 via INTRAVENOUS

## 2017-04-18 NOTE — Progress Notes (Signed)
Pt HR sustains in high 120s to 135, BP 101/80, RR 18, SpO2 99 on 3L nasal cannula. Pt asleep, denied discomfort, and pain, remains alert and oriented x 3. MD on call notified. Will continue to monitor pt.

## 2017-04-18 NOTE — Progress Notes (Signed)
Saw patient out of concern for hypotension, cardiology came by to examine patient while I was there, we appreciate the assistance with this patient's care.  We will add a small amount of fluid to assist with potential hypovolemia contribution to hypotension.   Will try 67ml/hr for 5 hr.   Cardiology will adjust cardiac meds for rate control.  -Dr. Criss Rosales

## 2017-04-18 NOTE — Discharge Summary (Signed)
[facility] Discharge Summary  Patient name: Brittany [name] Medical record number: [id number] Date of birth: [birth year] Age: 61 y.o. Gender: female Date of Admission: 04/16/2017  Date of Discharge: 04/20/17  Admitting Physician: Dickie La, MD  Primary Care Provider: System, Pcp Not In Consultants: Cardiology, pulmonology  Indication for Hospitalization: concern for sepsis, hypotention/hypoglycemia/AMS  Discharge Diagnoses/Problem List:  Afib, pulmonary effusion vs pulmonary edema, pancreatic insufficiency, dementia  Disposition: to home with [facility] coordinating care  Discharge Condition: stable  Discharge Exam: General: frail but alert woman, personable and interactive Cardiovascular: irregular rate/rhythm, no edema in LE bilaterally Respiratory: CTA bilaterally, no visible IWB Gastrointestinal: no complaint fo belly pain on palpation, aortic pulse palpable, bowel sounds present MSK: no deficits noted while laying down, can lift/move arms/legs at will Neuro: some speech slurring,strength and sensation grossly intact bilaterally upper and lower extremity  Psych:pleasant/interactive  Brief Hospital Course:  Admitted for concern with sepsis, hypoglycemic in 75s, afib with hypotension and AMS.   Fluid bolus/ABX/dextrose administered and hypotension/hypoglycemia resolved.  We also decreased dementia/psych medications for concern of contribution to AMS.  Patient then developed respiratory decompensation and required lasix.  Chest CT showed concerning findings which are being investigated by pulmonology.  Later in stay patient developed tachycardia ~200 with hypotension for which cardiology stopped metoprolol, increased amiodarone and ordered 2 doses of digoxin.  Patient recovered well and mental status was at baseline per family.  Decision was made and agreed on with patient that she was well enough to go home to be followed by [facility].  Issues for Follow Up:   1. Chest CT showed emphysema/ground grass densities/loculation/effusion.   Pulmonolgy also ordered a number of autoimmune blood tests.   Please ensure follwup on these things. 2. Reduced memantine to 5mg  and discontinued seroquel for concern of potential complications given renal function and multiple medications, please consider applicability of these temporary changes to her long term plan 3. [facility] had ordered a followup chest CT prior to their appt with patient on 11/14, please confirm and coordinate. 4. Patient had creon d/c'd and then restarted at 12000 a few days into her admission.  Please discuss family impressions of these differing doses from her long term plan to determine applicability. 5. Long discussion had with family about concern for salt.   Family had been admirably trying to restrict salt out of health concern but patient was subsequently refusing to eat.   Given significant weight loss, we discussed benefit. 6. Cardiology discontinued metoprolol in the setting of hypotension.   Amiodorone was increased to 200 BID.  Metoprolol may be considered to be restarted at 12.5mg  if needed for rate control and if patient has strong enough BP to tolerate it.  Significant Procedures: Chest CT  Significant Labs and Imaging:   Recent Labs Lab 04/17/17 0318 04/19/17 0724 04/20/17 0553  WBC 4.1 4.8 4.7  HGB 11.8* 10.9* 9.9*  HCT 40.0 34.9* 33.4*  PLT 147* 153 165    Recent Labs Lab 04/16/17 1125 04/17/17 0318 04/18/17 0801 04/19/17 0724 04/20/17 0553  NA 140 141 141 139 139  K 5.3* 3.8 3.8 4.0 4.3  CL 101 103 101 101 100*  CO2 22 22 28 28 29   GLUCOSE 194* 146* 95 90 97  BUN 51* 49* 33* 29* 24*  CREATININE 2.14* 2.07* 1.52* 1.33* 1.29*  CALCIUM 8.8* 8.9 9.0 9.2 9.3  ALKPHOS 76  --   --   --   --   AST 75*  --   --   --   --  ALT 30  --   --   --   --   ALBUMIN 3.6  --   --   --   --     Dg Lumbar Spine 2-3 Views  Result Date: 04/16/2017 CLINICAL DATA:  Low back pain  EXAM: LUMBAR SPINE - 2-3 VIEW COMPARISON:  CT 711 1,018 FINDINGS: Minimal scoliosis. Coarse right upper quadrant calcification may correspond to a liver calcification. Lumbar alignment within normal limits. Chronic superior endplate deformity at L2. Degenerative changes at L5-S1. Remaining vertebral body heights are normal. Atherosclerotic calcifications of the aorta. Calcifications anterior to the spine, likely correspond to the pancreatic calcifications noted on the comparison CT. IMPRESSION: 1. Chronic superior endplate deformity at L2. Moderate degenerative changes at L5-S1 2. No acute osseous abnormality Electronically Signed   By: Donavan Foil M.D.   On: 04/16/2017 17:14   Ct Chest Wo Contrast  Result Date: 04/17/2017 CLINICAL DATA:  Hypoglycemia, weakness atrial fibrillation EXAM: CT CHEST WITHOUT CONTRAST TECHNIQUE: Multidetector CT imaging of the chest was performed following the standard protocol without IV contrast. COMPARISON:  Radiograph 04/17/2017,, 02/22/2017, 07/21/2016 CT abdomen pelvis 01/24/2017 FINDINGS: Cardiovascular: Limited evaluation without intravenous contrast. Nonaneurysmal aorta. Mild atherosclerotic calcifications. Enlarged pulmonary trunk, measuring 3.8 cm with enlarged appearing right and left main pulmonary arteries. Cardiomegaly with biatrial enlargement. No significant pericardial effusion Mediastinum/Nodes: Mediastinal lymph nodes, slightly enlarged, measuring up to 9 mm in the precarinal space. Midline trachea. No thyroid mass. Esophagus unremarkable Lungs/Pleura: Small to moderate right pleural effusion with loculated fluid along the right pulmonary fissure. Multifocal ground-glass densities and mild consolidations within the bilateral lower lobes and bilateral upper lobes with relative sparing of the right middle lobe. Mild underlying emphysema. Negative for a pneumothorax. Upper Abdomen: Marked atrophy of the pancreas. No acute abnormality. Musculoskeletal: No chest wall  mass or suspicious bone lesions identified. IMPRESSION: 1. Cardiomegaly with biatrial enlargement. Enlarged pulmonary arteries, suggesting pulmonary hypertension 2. Moderate bilateral ground-glass densities and scattered areas of consolidation which could reflect multifocal pneumonia or unusual appearance of pulmonary edema. Underlying mild emphysema. 3. Moderate right pleural effusion with loculation along the pulmonary fissure. 4. Mild mediastinal adenopathy, possibly reactive Aortic Atherosclerosis (ICD10-I70.0) and Emphysema (ICD10-J43.9). Electronically Signed   By: Donavan Foil M.D.   On: 04/17/2017 23:18   Portable Chest 1 View  Result Date: 04/17/2017 CLINICAL DATA:  Dyspnea EXAM: PORTABLE CHEST 1 VIEW COMPARISON:  04/16/2017 FINDINGS: Cardiac enlargement. Bilateral perihilar airspace disease is increasing since previous study suggesting increasing pneumonia or edema. Small bilateral pleural effusions also may be increasing. No pneumothorax. Tortuous aorta. Thoracolumbar scoliosis. IMPRESSION: Cardiac enlargement peer at progressing bilateral perihilar airspace disease and small bilateral pleural effusions. Electronically Signed   By: Lucienne Capers M.D.   On: 04/17/2017 02:44   Dg Chest Port 1 View  Result Date: 04/16/2017 CLINICAL DATA:  Shortness of breath. EXAM: PORTABLE CHEST 1 VIEW COMPARISON:  Chest x-ray dated March 23, 2017. FINDINGS: Stable cardiomegaly. The lungs remain hyperinflated. Unchanged opacity in the right mid and lower lung with adjacent layering pleural fluid in the fissures. No pneumothorax. No acute osseous abnormality. IMPRESSION: 1. Unchanged right mid and lower lung airspace opacity and adjacent pleural effusion. 2. Stable cardiomegaly. Electronically Signed   By: Titus Dubin M.D.   On: 04/16/2017 11:07    Results/Tests Pending at Time of Discharge: mulitple auto-immune workup tests for pulmonology followup in 6wks  Discharge Medications:  Allergies as of  04/20/2017   No Known Allergies  Medication List    STOP taking these medications   furosemide 40 MG tablet Commonly known as:  LASIX   metoprolol tartrate 25 MG tablet Commonly known as:  LOPRESSOR   QUEtiapine 25 MG tablet Commonly known as:  SEROQUEL     TAKE these medications   acetaminophen 500 MG tablet Commonly known as:  TYLENOL Take 1,000 mg by mouth 2 (two) times daily as needed (for various aches and pains).   amiodarone 200 MG tablet Commonly known as:  PACERONE Take 1 tablet (200 mg total) by mouth 2 (two) times daily. What changed:  when to take this  additional instructions   aspirin 81 MG EC tablet Take 1 tablet (81 mg total) by mouth daily. What changed:  medication strength  how much to take   donepezil 10 MG tablet Commonly known as:  ARICEPT Take 10 mg by mouth at bedtime.   feeding supplement (ENSURE ENLIVE) Liqd Take 237 mLs by mouth 3 (three) times daily between meals.   lamoTRIgine 100 MG tablet Commonly known as:  LAMICTAL Take 100 mg by mouth 2 (two) times daily.   latanoprost 0.005 % ophthalmic solution Commonly known as:  XALATAN Place 1 drop into both eyes at bedtime.   levETIRAcetam 750 MG tablet Commonly known as:  KEPPRA Take 750 mg by mouth 2 (two) times daily.   levofloxacin 750 MG tablet Commonly known as:  LEVAQUIN Take 1 tablet (750 mg total) by mouth every other day.   lipase/protease/amylase 12000 units Cpep capsule Commonly known as:  CREON Take 1 capsule (12,000 Units total) by mouth 3 (three) times daily before meals. What changed:  medication strength  how much to take  when to take this  additional instructions   memantine 5 MG tablet Commonly known as:  NAMENDA Take 1 tablet (5 mg total) by mouth 2 (two) times daily. What changed:  medication strength  how much to take   Vitamin D3 2000 units Tabs Take 2,000 Units by mouth daily.       Discharge Instructions: Please refer to  Patient Instructions section of EMR for full details.  Patient was counseled important signs and symptoms that should prompt return to medical care, changes in medications, dietary instructions, activity restrictions, and follow up appointments.   Follow-Up Appointments: Follow-up Information    Donita Brooks, NP Follow up on 05/30/2017.   Specialty:  Pulmonary Disease Why:  Appt at 9:15 AM.  We will arrange for a CT of the chest to be completed prior to the appointment.  Contact information: [place] [id number] [place], [place], DO 04/20/2017, 12:27 PM PGY-1, [place]

## 2017-04-18 NOTE — Progress Notes (Signed)
Family Medicine Teaching Service Daily Progress Note Intern Pager: [phone]  Patient name: Brittany [name] Medical record number: [id number] Date of birth: 20-Oct-1955 Age: 61 y.o. Gender: female  Primary Care Provider: System, [name] Not In Consultants:  Code Status: full  Pt Overview and Major Events to Date:  Brittany [name] is a 61 y.o. female presenting with hypoglycemia, hypothermia and weakness . PMH is significant for frequent falls, chronic diastolic heart failure, persistent atrial fibrillation/flutter, CKD3, HTN, seizure, dementia, COPD, and CVA.  Major events include respiratory decompensation evening of 10/1 which appears to have been fluid overload in pulmonary system addressed with lasix diuresis.  Assessment and Plan: Brittany [name] is a 61 y.o. female presenting with hypoglycemia, hypothermia and weakness . PMH is significant for frequent falls, chronic diastolic heart failure, persistent atrial fibrillation/flutter, CKD3, HTN, seizure, dementia, COPD, and CVA.  Sepsis presentation: CXR showed change right mid and lower lung airspace opacity with adjacent pleural effusion and stable cardiomegaly with minimal concern for pneumonia. Patient was started on broad-spectrum antibiotics. UA neg.  No leukocytosis noted on CBC making infectious process or septic presentation less likely. Afebrile overnight --Admit patient to have MTS, admitting physician Dr. Dorcas Mcmurray - Continue vancomycin 3.375 g IV - Continue zosyn 2.25 g IV - trend lactic acid -procalcitonin 2.77 -Follow up on blood cultures  Respiratory status/emphysema: Rapid called 10/1 overnight due to respiratory distress.   IV fluids had been d/c'd prior out of concern for fluid overload and crackles in lungs.   On evaluation in room, patient had significantly increased crackles, IV lasix was administered and patient recovered.  CT showed likely pulmonary hypertension, ground glass densities (pneumonia or edema) R pleural  effusion along fissure, mediatinal adenopahty, aortic atherosclerosis and emphysema. -hold IV fluids -O2 monitoring -will add PFT/pulm workup to discharge note   Diastolic CHF: no edema in lower extremities, diffuse crackle bilaterally on lung exam.  Son says she has had consistent problems with salt/fluid intake in the past.  BNP 1821.  Last echo 7/18 showed 50-55%EF. Patient was recently (8/09) seen by cardiologist Dr. Kirk Ruths and had a BNP of 1800 seems to be her baseline. Patient has a history of uncontrolled salt consumption per family. Lasix was recently increased to 40 mg daily  and metoprolol 37.5 mg twice a day. -holding diuretics for now -metoprolol currently 12.5 due to hypotension - daily weights -Monitor I's and O's  Acute on chronic  kidney disease stage III: still making urine, follows with urology. Patient is pre-dialysis. Creatinine 2.07 above baseline of 1.5-1.98 earlier this year. Elevated creatinine likely secondary to hypovolemia versus worsening kidney function -strict I/O -Follow-up on a.m. BMP -furosemide being given on single doses to balance nephro/resp concerns  Afib/A flutter- patient followed by cardiology. Patient is rate controlled with amiodarone and metoprolol. Patient is currently not on anticoagulation due to increased risk of bleeding with history of frequent falls. Patient has multiple afib treatments in their past medical record.  HR was consistently irregular on exam by multiple providers. EKG on admission showed atrial fibrillation/atrial flutter. -Resume amiodarone 200 mg daily -add metoprolol 25 BID (home dose)  AMS/Dementia- at baseline per family - home seroquel - home memantine - home donepezil -discussed potential of editing dementia medications with family who is in agreement with developing proposal to discuss with [facility] -ECG/cardiac monitor  Tachycardia-120s overnight.  Metoprolol had been held due to prior  hypotension -metoprolol 25BID was added back to her regimen for rate control, first dose AM 10/3  Back/flank pain- patient describes pain as "in her kidneys" but has no CVA tenderness. She located pain to lumbar region between spine and PSIS on R side.  Distal sensation and motor strength intact.  Denies urinary symptoms but is demented.  Lumbar spine XR neg for acute process, UA neg -Acetaminophen 650 mg q6prn  Hypoglycemia: resolve -Will continue to monitor -routine CBGs  Pancreatitis History of pancreatitis on Creon will hold while inpatient. Will resume  when appropriate  Seizure disorder Appears to be well controlled on home medication. No seizure activity reported in the setting of hypoglycemia. Will continue home regimen. 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 -Agree to try plan of letting her add some salt to food.   Expressed that weight loss is a serious life risk for her and that even salty food is better than no food at this point. -instructed nursing to allow a few salt packets per meal if she is refusing the meal tray  Pain control- -tylenol   FEN/GI: Heart healthy diet, pantoprazole,  Prophylaxis: lovenox/aspirin 81mg   Disposition: Home pending clinical improvement  Subjective:  She was feeling better and everything was fine but accuracy is questionable due to mental state.   Agrees to try and eat her meals to avoid weight loss if we allow her some salt, repeats "if it don't taste good, I'm not eating it"  Objective: Temp:  [97.9 F (36.6 C)-98.6 F (37 C)] 98.6 F (37 C) (10/03 0446) Pulse Rate:  [111-133] 118  (10/03 0450) Resp:  [14-20] 16 (10/03 0446) BP: (84-136)/(56-105) 84/64 (10/03 0450) SpO2:  [80 %-100 %] 94 % (10/03 0446) Weight:  [92 lb 11.2 oz (42 kg)] 92 lb 11.2 oz (42 kg) (10/03 0453) Physical Exam: General: frail but alert woman, personable and interactive ENTM: R sided buccal lesions, white with no indication of bleeding (not painful to patient)            Neck: FROM, no complaint of neck pain Cardiovascular: irregular rate/rhythm, no edema in LE bilaterally Respiratory: crackles throughout lung fields bilaterally, no visible IWB Gastrointestinal: no complaint fo belly pain on palpation, aortic pulse palpable, bowel sounds present MSK: no deficits noted while laying down, can lift/move arms/legs at will Neuro: some speech slurring, strength and sensation grossly intact bilaterally upper and lower extremity  Psych: AOx2 (not time), pleasant/interactive  Laboratory:  Recent Labs Lab 04/16/17 1125 04/17/17 0318  WBC 5.3 4.1  HGB 10.8* 11.8*  HCT 35.0* 40.0  PLT 149* 147*    Recent Labs Lab 04/16/17 1125 04/17/17 0318  NA 140 141  K 5.3* 3.8  CL 101 103  CO2 22 22  BUN 51* 49*  CREATININE 2.14* 2.07*  CALCIUM 8.8* 8.9  PROT 6.6  --   BILITOT 1.8*  --   ALKPHOS 76  --   ALT 30  --   AST 75*  --   GLUCOSE 194* 146*    procalcitonin 2.77  Imaging/Diagnostic Tests: Dg Lumbar Spine 2-3 Views  Result Date: 04/16/2017 CLINICAL DATA:  Low back pain EXAM: LUMBAR SPINE - 2-3 VIEW COMPARISON:  CT 711 1,018 FINDINGS: Minimal scoliosis. Coarse right upper quadrant calcification may  correspond to a liver calcification. Lumbar alignment within normal limits. Chronic superior endplate deformity at L2. Degenerative changes at L5-S1. Remaining vertebral body heights are normal. Atherosclerotic calcifications of the aorta. Calcifications anterior to the spine, likely correspond to the pancreatic calcifications noted on the comparison CT. IMPRESSION: 1. Chronic superior endplate  deformity at L2. Moderate degenerative changes at L5-S1 2. No acute osseous abnormality Electronically Signed   By: Donavan Foil M.D.   On: 04/16/2017 17:14   Portable Chest 1 View  Result Date: 04/17/2017 CLINICAL DATA:  Dyspnea EXAM: PORTABLE CHEST 1 VIEW COMPARISON:  04/16/2017 FINDINGS: Cardiac enlargement. Bilateral perihilar airspace disease is increasing since previous study suggesting increasing pneumonia or edema. Small bilateral pleural effusions also may be increasing. No pneumothorax. Tortuous aorta. Thoracolumbar scoliosis. IMPRESSION: Cardiac enlargement peer at progressing bilateral perihilar airspace disease and small bilateral pleural effusions. Electronically Signed   By: Lucienne Capers M.D.   On: 04/17/2017 02:44   Dg Chest Port 1 View  Result Date: 04/16/2017 CLINICAL DATA:  Shortness of breath. EXAM: PORTABLE CHEST 1 VIEW COMPARISON:  Chest x-ray dated March 23, 2017. FINDINGS: Stable cardiomegaly. The lungs remain hyperinflated. Unchanged opacity in the right mid and lower lung with adjacent layering pleural fluid in the fissures. No pneumothorax. No acute osseous abnormality. IMPRESSION: 1. Unchanged right mid and lower lung airspace opacity and adjacent pleural effusion. 2. Stable cardiomegaly. Electronically Signed   By: Titus Dubin M.D.   On: 04/16/2017 11:07     Sherene Sires, DO 04/18/2017, 6:19 AM PGY-1, [place] Intern pager: [phone], text pages welcome

## 2017-04-18 NOTE — Progress Notes (Signed)
CCM/pulm and Cardio consulted by phone

## 2017-04-18 NOTE — Consult Note (Signed)
Name: Jannett Schmall MRN: [id number] DOB: August 18, 1955    ADMISSION DATE:  04/16/2017 CONSULTATION DATE:  04/18/17  REFERRING MD :  Dr. Nori Riis / [facility]PT[facility]   CHIEF COMPLAINT:  Weakness    HISTORY OF PRESENT ILLNESS:  61 y/o F who presented to [facility] on 10/1 from her primary MD office with weakness & sweating.    CBG on EMS arrival was 47.  She was given glucagon.  In the ER, she was found to be hypothermic and placed on a bair hugger.  She had an elevated lactic acid and was treated with IVF's and empiric antibiotics.  CXR raised concern for R mid and lower lung opacities with effusion.  With volume and re-warming her mental status improved.  She was admitted by [facility] for further observation.  A follow up CT of the chest was assessed which demonstrated bilateral ground glass opacities, traction bronchiectasis, nodular airspace disease, emphysema, loculated pleural effusion on the R and mediastinal lymphadenopathy.  PCCM consulted for evaluation of abnormal CT.  At baseline, the patient with home with her family. They report she has the early stages of Alzheimer's. She goes to the pace program during the day. She states she began smoking at age 5, approximately one half pack per day. Her family reports she continues to sneak cigarettes while at the pace program. She states that she typically eats and then lays in bed. She has days that she does not want to get out of bed.She denies known choking on foods, coughing with eating or acid reflux.she has dentures and reports that they fit well. Her daughter is at bedside and reports that she has lost weight since she quit drinking approximately 10 years ago. She was diagnosed with COPD in the last 2 years.  She is from [place], [place].  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   has a past surgical history that includes Central line insertion (07/21/2016) and Shoulder surgery (Left).  Prior to Admission medications   Medication Sig Start Date End Date Taking? Authorizing Provider  acetaminophen (TYLENOL) 500 MG tablet Take 1,000 mg by mouth 2 (two) times daily as needed (for various aches and pains).    Yes [provider]  amiodarone (PACERONE) 200 MG tablet Take 1 tablet (200 mg total) by mouth daily. Start taking 07/30/15 07/26/16  Yes Gherghe, Vella Redhead, MD  aspirin EC 325 MG tablet Take 325 mg by mouth daily.   Yes [provider]  Cholecalciferol (VITAMIN D3) 2000 units TABS Take 2,000 Units by mouth daily.   Yes [provider]  donepezil (ARICEPT) 10 MG tablet Take 10 mg by mouth at bedtime.   Yes [provider]  furosemide (LASIX) 40 MG tablet Take 1 tablet (40 mg total) by mouth daily. Alternate 40 mg every other day wit 20 mg every other day 01/26/17 04/26/17 Yes Bonnielee Haff, MD  lamoTRIgine (LAMICTAL) 100 MG tablet Take 100 mg by mouth 2 (two) times daily.   Yes [provider]  latanoprost (XALATAN) 0.005 % ophthalmic solution Place 1 drop into both eyes at bedtime.   Yes [provider]  levETIRAcetam (KEPPRA) 750 MG tablet Take 750  mg by mouth 2 (two) times daily.   Yes [provider]  memantine (NAMENDA) 10 MG tablet Take 10 mg by mouth 2 (two) times daily.   Yes [provider]  metoprolol tartrate (LOPRESSOR) 25 MG tablet Take 1 tablet (25 mg total) by mouth 2 (two) times daily. 08/09/16  Yes Eber Jones, MD  Pancrelipase, Lip-Prot-Amyl, (CREON) 24000-76000 units CPEP Take 1 capsule by mouth 3 (three) times daily with meals. FOR DIGESTION   Yes [provider]  QUEtiapine  (SEROQUEL) 25 MG tablet Take 12.5 mg by mouth every evening.   Yes [provider]    No Known Allergies  FAMILY HISTORY:  family history includes Cancer in her child and mother; Cerebrovascular Accident in her child; Gout in her child; Hypertension in her father.  SOCIAL HISTORY:  reports that she has quit smoking. She has never used smokeless tobacco. She reports that she drinks alcohol. She reports that she does not use drugs.  REVIEW OF SYSTEMS:  POSITIVES IN BOLD Constitutional: Negative for fever, chills, weight loss, malaise/fatigue and diaphoresis. Generalized weakness. HENT: Negative for hearing loss, ear pain, nosebleeds, congestion, sore throat, neck pain, tinnitus and ear discharge.   Eyes: Negative for blurred vision, double vision, photophobia, pain, discharge and redness.  Respiratory: Negative for cough, hemoptysis, sputum production, shortness of breath, wheezing and stridor.   Cardiovascular: Negative for chest pain, palpitations, orthopnea, claudication, leg swelling and PND.  Gastrointestinal: Negative for heartburn, nausea, vomiting, abdominal pain, diarrhea, constipation, blood in stool and melena.  Genitourinary: Negative for dysuria, urgency, frequency, hematuria and flank pain.  Musculoskeletal: Negative for myalgias, back pain, joint pain and falls.  Skin: Negative for itching and rash.  Neurological: Negative for dizziness, tingling, tremors, sensory change, speech change, focal weakness, seizures, loss of consciousness, weakness and headaches.  Endo/Heme/Allergies: Negative for environmental allergies and polydipsia. Does not bruise/bleed easily.  SUBJECTIVE:   VITAL SIGNS: Temp:  [97.9 F (36.6 C)-98.6 F (37 C)] 98.6 F (37 C) (10/03 0446) Pulse Rate:  [107-133] 128 (10/03 1035) Resp:  [14-16] 16 (10/03 0446) BP: (82-126)/(56-105) 82/60 (10/03 1035) SpO2:  [80 %-100 %] 94 % (10/03 0446) Weight:  [92 lb 11.2 oz (42 kg)] 92 lb 11.2 oz (42 kg)  (10/03 0453)  PHYSICAL EXAMINATION: General:  Cachectic adult female in no acute distress HEENT: MM pink/moist HCW:CBJS/[id number] Neuro: lert and oriented to self and place. She seems to be oriented to remote events. Moving all extremities. CV: s1s2 rrr, no m/r/g PULM: even/non-labored, lungs bilaterally clear anterior, diminished right lower lobe posterior, bibasilar crackles VP:XTGG, non-tender, bsx4 active  Extremities: warm/dry, no edema  Skin: no rashes or lesions   Recent Labs Lab 04/16/17 1125 04/17/17 0318 04/18/17 0801  NA 140 141 141  K 5.3* 3.8 3.8  CL 101 103 101  CO2 _0 BUN 51* 49* 33*  CREATININE 2.14* 2.07* 1.52*  GLUCOSE 194* 146* 95     Recent Labs Lab 04/16/17 1125 04/17/17 0318  HGB 10.8* 11.8*  HCT 35.0* 40.0  WBC 5.3 4.1  PLT 149* 147*    Dg Lumbar Spine 2-3 Views  Result Date: 04/16/2017 CLINICAL DATA:  Low back pain EXAM: LUMBAR SPINE - 2-3 VIEW COMPARISON:  CT 711 1,018 FINDINGS: Minimal scoliosis. Coarse right upper quadrant calcification may correspond to a liver calcification. Lumbar alignment within normal limits. Chronic superior endplate deformity at L2. Degenerative changes at L5-S1. Remaining vertebral body heights are normal. Atherosclerotic calcifications of the aorta. Calcifications  anterior to the spine, likely correspond to the pancreatic calcifications noted on the comparison CT. IMPRESSION: 1. Chronic superior endplate deformity at L2. Moderate degenerative changes at L5-S1 2. No acute osseous abnormality Electronically Signed   By: Donavan Foil M.D.   On: 04/16/2017 17:14   Ct Chest Wo Contrast  Result Date: 04/17/2017 CLINICAL DATA:  Hypoglycemia, weakness atrial fibrillation EXAM: CT CHEST WITHOUT CONTRAST TECHNIQUE: Multidetector CT imaging of the chest was performed following the standard protocol without IV contrast. COMPARISON:  Radiograph 04/17/2017,, 02/22/2017, 07/21/2016 CT abdomen pelvis 01/24/2017 FINDINGS:  Cardiovascular: Limited evaluation without intravenous contrast. Nonaneurysmal aorta. Mild atherosclerotic calcifications. Enlarged pulmonary trunk, measuring 3.8 cm with enlarged appearing right and left main pulmonary arteries. Cardiomegaly with biatrial enlargement. No significant pericardial effusion Mediastinum/Nodes: Mediastinal lymph nodes, slightly enlarged, measuring up to 9 mm in the precarinal space. Midline trachea. No thyroid mass. Esophagus unremarkable Lungs/Pleura: Small to moderate right pleural effusion with loculated fluid along the right pulmonary fissure. Multifocal ground-glass densities and mild consolidations within the bilateral lower lobes and bilateral upper lobes with relative sparing of the right middle lobe. Mild underlying emphysema. Negative for a pneumothorax. Upper Abdomen: Marked atrophy of the pancreas. No acute abnormality. Musculoskeletal: No chest wall mass or suspicious bone lesions identified. IMPRESSION: 1. Cardiomegaly with biatrial enlargement. Enlarged pulmonary arteries, suggesting pulmonary hypertension 2. Moderate bilateral ground-glass densities and scattered areas of consolidation which could reflect multifocal pneumonia or unusual appearance of pulmonary edema. Underlying mild emphysema. 3. Moderate right pleural effusion with loculation along the pulmonary fissure. 4. Mild mediastinal adenopathy, possibly reactive Aortic Atherosclerosis (ICD10-I70.0) and Emphysema (ICD10-J43.9). Electronically Signed   By: Donavan Foil M.D.   On: 04/17/2017 23:18   Portable Chest 1 View  Result Date: 04/17/2017 CLINICAL DATA:  Dyspnea EXAM: PORTABLE CHEST 1 VIEW COMPARISON:  04/16/2017 FINDINGS: Cardiac enlargement. Bilateral perihilar airspace disease is increasing since previous study suggesting increasing pneumonia or edema. Small bilateral pleural effusions also may be increasing. No pneumothorax. Tortuous aorta. Thoracolumbar scoliosis. IMPRESSION: Cardiac enlargement  peer at progressing bilateral perihilar airspace disease and small bilateral pleural effusions. Electronically Signed   By: Lucienne Capers M.D.   On: 04/17/2017 02:44    SIGNIFICANT EVENTS  10/01  Admit  STUDIES:  CT Chest w/o 10/2 >> Cardiomegaly with biatrial enlargement. Enlarged pulmonary arteries, suggesting pulmonary hypertension Moderate bilateral ground-glass densities and scattered areas of consolidation which could reflect multifocal pneumonia or unusual appearance of pulmonary edema. Underlying mild emphysema. Moderate right pleural effusion with loculation along the pulmonary fissure. Mild mediastinal adenopathy, possibly reactive    ASSESSMENT / PLAN:  Discussion: 61 y/o F with dementia, followed at [facility], former polysubstance abuse, heavy alcohol (quit in 2008) who presented with weakness in the setting of hypoglycemia. She was also found to have hypotension and hypothermia. Initial chest x-ray was concerning for a right-sided airspace disease. Subsequent CT with complex findings as above. Given her overall history, I'm concerned that she could be chronically aspirating with pneumonia and loculated effusion. In addition, the CT does raise concern for an underlying malignancy given rounded opacities and smoking history.   Emphysema / COPD - not defined with PFT's, hx of longstanding tobacco abuse.  No evidence of bronchospasm on exam/acute exacerbation.  Bilateral Ground Glass Opacities - ddx includes pulmonary edema, pneumonitis, acute infectious process  Bronchiectasis - mild with traction noted on imaging, ? Chronic aspiration  Nodular Opacities Bilaterally - ? Underlying malignancy given smoking history and cachexia   Plan: Assess MBS Complete  7-10 day course of empiric antibiotics for PNA, D3/x  She will need a repeat CT of the chest to ensure clearance of opacities Aspiration Precautions  Intermittent chest x-ray O2 support for sats 88-94% Consider IR CT guided  thoracentesis for loculated pleural fluid > note has a thick rind around fluid Do not feel she would be a surgical candidate / VATS etc Assess ESR, CRP, ANA, ANCA, RF, scl70, ACE May need FOB for sputum sampling HIV negative in July 2018   Noe Gens, NP-C [facility] Pgr: [phone] or if no answer [phone] 04/18/2017, 3:08 PM  Attending Note:  62 year old female with extensive PMH who presented with weakness and failure to thrive.  CT of the chest was done and revealed that patient had some GGO and chronic changes that I reviewed myself.  On exam, lungs with bibasilar crackles.  Discussed with PCCM-NP.  GGO: concern for cancer given history  - Auto-immune work up  - Treat infection  - CT in 6 wks prior to f/u with pulmonary as outpatient  ?Lung cancer: patient is in a very poor physical condition, can not put to sleep now for any diagnostic procedures.    - Nutrition to see  - Physical rehab  - F/u in 6 wks maybe bronch then  - Spoke with daughter, they would like to think if patient would want chemo/radiation given her physical and mental condition  Atypical infection:  - Add a quinolone for total of 8 days  - F/u imaging.  Hypoxemia:  - Titrate O2 for sat of 88-92%  F/u in 6 wks appointment made  PCCM will sign off, please call back if needed.  Patient seen and examined, agree with above note.  I dictated the care and orders written for this patient under my direction.  Rush Farmer, [place]

## 2017-04-18 NOTE — Progress Notes (Signed)
Initial Nutrition Assessment  DOCUMENTATION CODES:   Severe malnutrition in context of chronic illness, Underweight  INTERVENTION:   -D/c Boost Breeze po TID, each supplement provides 250 kcal and 9 grams of protein -Ensure Enlive po TID, each supplement provides 350 kcal and 20 grams of protein  NUTRITION DIAGNOSIS:   Malnutrition (Severe) related to chronic illness (COPD) as evidenced by moderate depletion of body fat, severe depletion of body fat, moderate depletions of muscle mass, severe depletion of muscle mass.  GOAL:   Patient will meet greater than or equal to 90% of their needs  MONITOR:   PO intake, Supplement acceptance, Labs, Weight trends, Skin, I & O's  REASON FOR ASSESSMENT:   Consult Assessment of nutrition requirement/status  ASSESSMENT:   Brittany [name] is a 61 y.o. female presenting with hypoglycemia, hypothermia and weakness . PMH is significant for frequent falls, chronic diastolic heart failure, persistent atrial fibrillation/flutter, CKD3, HTN, seizure, dementia, COPD, and CVA.  Pt admitted with hypothermia and weakness.   Spoke with pt, who reports poor oral intake during hospitalization due to dislike of the food ("it doesn't taste good and the broccoli wasn't cooked enough"). Pt reports she usually consumes 3 meals per day (example of a meal is a meat, starch, and vegetable, prepared by family who lives nearby). Pt reports she also consumes MedPass 1-2 times daily at home (one MedPass 2.0 shake provides 480 kcals and 20 grams protein). She consumed 100% of Boost Breeze supplement and 505 of Ensure supplement this AM.   Pt shares her UBW is around 117#. She endorses progressive wt loss over the past 3 years, which she attributes to excessive alcohol consumption (she reports now abstaining from alcohol). Pt has experienced a 8.9% wt loss over the past 6 months, which is not significant for time frame, however, concerning in the presence of multiple  chronic illnesses and poor oral intake.  Nutrition-Focused physical exam completed. Findings are moderate to severe fat depletion, moderate to severe muscle depletion, and no edema.   Discussed importance of good meal and supplement intake to promote healing. Reviewed supplement options and nutritional content of supplements. Will add Ensure Enlive instead of Boost Breeze related to increased nutritional density. Encouraged pt to continue MedPass or high nutrient dense supplement such as Ensure Enlive at home.   Labs reviewed: CBGS: 124-127.   Diet Order:  Diet regular Room service appropriate? Yes; Fluid consistency: Thin  Skin:  Reviewed, no issues  Last BM:  04/18/17  Height:   Ht Readings from Last 1 Encounters:  04/16/17 5\' 3"  (1.6 m)    Weight:   Wt Readings from Last 1 Encounters:  04/18/17 92 lb 11.2 oz (42 kg)    Ideal Body Weight:  52.3 kg  BMI:  Body mass index is 16.42 kg/m.  Estimated Nutritional Needs:   Kcal:  1500-1700  Protein:  80-95 grams  Fluid:  1.5-1.7 L  EDUCATION NEEDS:   Education needs addressed  Dawid Dupriest A. Jimmye Norman, RD, LDN, CDE Pager: [phone] After hours Pager: [phone]

## 2017-04-18 NOTE — Progress Notes (Signed)
MD notified ptsBP 82/60 HR  122 orders given tol hold amiodarone and metoprolol will continue to monitor  Luci Bank, RN

## 2017-04-18 NOTE — Progress Notes (Addendum)
Pt BP 84/64, HR 105 to 120s, other VS WDL. Pt asymptomatic, stated she feels good. MD notified [facility] [place] RN instructed to monitor pt, and report changes in pt condition.

## 2017-04-18 NOTE — Progress Notes (Signed)
Pt has labs scheduled, Keppra and Lamictal level, pt refused. Pharmacy consulted, RN instructed to administer med if pt refuse lab work.  Pt refused labs, pt educated on importance of test.  Lab notified to move labs for 5 am.

## 2017-04-18 NOTE — Progress Notes (Addendum)
FPTS Interim Progress Note  Was informed by nursing that patient had remained tachycardic, HR 130s, with BP of 89/67. Spoke to Cardiologist on call, Dr. Eula Fried, to discuss case given patient's history of hypotension with tachycardia during hospital stay that cardiology had been following. Given patient's history of crackles in lungs and O2 use will not give fluids at this time. Cardiology recommendations of digoxin loading dose of 0.25 mg IV x 2 doses (once now and once in 6 hours).  -appreciate cardiology recommendations -will plan to give digoxin 0.25 mg IV now and again in 6 hours, per cardiology recommendations -continue to monitor  Caroline More, DO 04/18/2017, 10:25 PM PGY-1, [place] pager [phone]

## 2017-04-18 NOTE — Consult Note (Signed)
Cardiology Consultation:   Patient ID: Shauniece [name]; [id number]; Dec 18, 1955   Admit date: 04/16/2017 Date of Consult: 04/18/2017  Primary Care Provider: System, Pcp Not In Primary Cardiologist: Dr. Tamala Julian and [name], NP   Patient Profile:   Brittany [name] is a 61 y.o. female with a hx of frequent falls, chronic diastolic heart failure, persistent atrial fibrillation/flutter, CK D stage III, hypertension, seizures, dementia, COPD, pulmonary hypertension, PAD, chronic alcoholism and CVA who is being seen today for the evaluation of atrial fibrillation at the request of Dr. Nori Riis.  History of Present Illness:   Brittany [name] presented with hypoglycemia, hypothermia and weakness on 04/16/2017. She is being treated for sepsis on IV antibiotics. She is currently in atrial fibrillation/flutter with rapid ventricular response. The patient has a history of chronic atrial fibrillation on rate control with amiodarone and metoprolol. She is currently not anticoagulated due to increased risk of bleeding with history of frequent falls.  The patient was last seen in the office on 01/31/17 by [name], NP. The patient was noted to have had 3 previous admissions to the hospital this year secondary to worsening dyspnea/acute diastolic heart failure She is known to be noncompliant with her diet, getting too much salt. On one admission she had shock and hypotension associated with diarrhea and uncontrolled atrial fibrillation. She was placed on amiodarone due to poorly controlled rates and low blood pressure. She had an admission in July for atrial fibrillation with rapid ventricular response and associated pulmonary edema. Echo showed EF 50-55%. She required BiPAP. She was diuresed. She is on high-dose aspirin related to prior stroke.  Currently the patient denies chest discomfort or shortness of breath or palpitations. Her daughter is present and states that the patient had shortness of breath with  minimal exertion. The patient is very thin and frail. She has crackles in her lung bases, but no edema. She is alert and oriented, however, she has poor memory and seemingly poor awareness of her health and treatment. Her daughter says that this on was taking the patient to her doctor's appointment and she complained of being fatigued. This was reported to the provider who noted crackles in her lungs and the son decided to bring her to the hospital. This is related to me by the daughter. The patient has no recollection of these events.  Significant findings: Serum creatinine was 2.14 on admission, 1.52 today (Around recent baseline) Serum potassium 5.3 on admission, 3.8 today Troponins 0.03, 0.04 Lactic acid 5.52 --> 1.5 Hemoglobin 11.8,   WBCs 4.1 Chest x-ray: Small bilateral pleural effusions and cardiac enlargement CT of the chest:   1. Cardiomegaly with biatrial enlargement. Enlarged pulmonary arteries, suggesting pulmonary  hypertension  2. Moderate bilateral ground-glass densities and scattered areas of consolidation which could reflect  multifocal pneumonia or unusual appearance of pulmonary edema. Underlying mild emphysema.  3. Moderate right pleural effusion with loculation along the pulmonary fissure.  4. Mild mediastinal adenopathy, possibly reactive  Past Medical History:  Diagnosis Date  . Atrial flutter ([place])   . COPD (chronic obstructive pulmonary disease) ([place])   . CVA (cerebral vascular accident) ([place])   . 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 Diastolic heart failure ([place])   . HTN (hypertension)   . PAD (peripheral artery disease) ([place])   . Pancreatitis   . Seizures ([place])     Past Surgical History:  Procedure Laterality Date  . CENTRAL LINE INSERTION  07/21/2016      . SHOULDER SURGERY  Left      Home Medications:  Prior to Admission medications   Medication Sig Start Date End Date Taking? Authorizing Provider  acetaminophen (TYLENOL) 500 MG tablet Take 1,000 mg by  mouth 2 (two) times daily as needed (for various aches and pains).    Yes [provider]  amiodarone (PACERONE) 200 MG tablet Take 1 tablet (200 mg total) by mouth daily. Start taking 07/30/15 07/26/16  Yes Gherghe, Vella Redhead, MD  aspirin EC 325 MG tablet Take 325 mg by mouth daily.   Yes [provider]  Cholecalciferol (VITAMIN D3) 2000 units TABS Take 2,000 Units by mouth daily.   Yes [provider]  donepezil (ARICEPT) 10 MG tablet Take 10 mg by mouth at bedtime.   Yes [provider]  furosemide (LASIX) 40 MG tablet Take 1 tablet (40 mg total) by mouth daily. Alternate 40 mg every other day wit 20 mg every other day 01/26/17 04/26/17 Yes Bonnielee Haff, MD  lamoTRIgine (LAMICTAL) 100 MG tablet Take 100 mg by mouth 2 (two) times daily.   Yes [provider]  latanoprost (XALATAN) 0.005 % ophthalmic solution Place 1 drop into both eyes at bedtime.   Yes [provider]  levETIRAcetam (KEPPRA) 750 MG tablet Take 750 mg by mouth 2 (two) times daily.   Yes [provider]  memantine (NAMENDA) 10 MG tablet Take 10 mg by mouth 2 (two) times daily.   Yes [provider]  metoprolol tartrate (LOPRESSOR) 25 MG tablet Take 1 tablet (25 mg total) by mouth 2 (two) times daily. 08/09/16  Yes Eber Jones, MD  Pancrelipase, Lip-Prot-Amyl, (CREON) 24000-76000 units CPEP Take 1 capsule by mouth 3 (three) times daily with meals. FOR DIGESTION   Yes [provider]  QUEtiapine (SEROQUEL) 25 MG tablet Take 12.5 mg by mouth every evening.   Yes [provider]    Inpatient Medications: Scheduled Meds: . amiodarone  200 mg Oral Daily  . aspirin EC  81 mg Oral Daily  . donepezil  10 mg Oral QHS  . enoxaparin (LOVENOX) injection  30 mg Subcutaneous Q24H  . feeding supplement  1 Container Oral TID BM  . lamoTRIgine  100 mg Oral BID  . latanoprost  1 drop Both Eyes QHS  . levETIRAcetam  750 mg Oral BID  .  lipase/protease/amylase  12,000 Units Oral TID AC  . mouth rinse  15 mL Mouth Rinse BID  . memantine  5 mg Oral BID  . metoprolol tartrate  12.5 mg Oral BID   Continuous Infusions: . piperacillin-tazobactam (ZOSYN)  IV Stopped (04/18/17 1238)  . vancomycin 500 mg (04/18/17 1239)   PRN Meds: acetaminophen **OR** acetaminophen  Allergies:   No Known Allergies  Social History:   Social History   Social History  . Marital status: Widowed    Spouse name: N/A  . Number of children: N/A  . Years of education: N/A   Occupational History  . Not on file.   Social History Main Topics  . Smoking status: Former Research scientist (life sciences)  . Smokeless tobacco: Never Used  . Alcohol use Yes  . Drug use: No  . Sexual activity: Not on file   Other Topics Concern  . Not on file   Social History Narrative  . No narrative on file    Family History:    Family History  Problem Relation Age of Onset  . Hypertension Father   . Cancer Mother   . Cancer Child   .  Cerebrovascular Accident Child   . Gout Child      ROS:  Please see the history of present illness.  ROS  All other ROS reviewed and negative.     Physical Exam/Data:   Vitals:   04/18/17 0450 04/18/17 0453 04/18/17 0600 04/18/17 1035  BP: (!) 84/64  (!) 84/60 (!) 82/60  Pulse: (!) 118  (!) 107 (!) 128  Resp:      Temp:      TempSrc:      SpO2:      Weight:  92 lb 11.2 oz (42 kg)    Height:        Intake/Output Summary (Last 24 hours) at 04/18/17 1355 Last data filed at 04/18/17 0930  Gross per 24 hour  Intake              627 ml  Output              725 ml  Net              -98 ml   Filed Weights   04/16/17 1131 04/18/17 0453  Weight: 92 lb 2.4 oz (41.8 kg) 92 lb 11.2 oz (42 kg)   Body mass index is 16.42 kg/m.  General:  Very thin, frail female, in no acute distress HEENT: normal Lymph: no adenopathy Neck: no JVD Endocrine:  No thryomegaly Vascular: No carotid bruits; FA pulses 2+ bilaterally without bruits    Cardiac:  normal S1, S2; irregularly irregular rhythm, no murmur  Lungs:  clear to auscultation bilaterally with rales in the posterior bases Abd: soft, nontender, no hepatomegaly  Ext: no edema Musculoskeletal:  No deformities, BUE and BLE strength normal and equal Skin: warm and dry  Neuro:  Poor short-term memory Psych:  Normal affect   EKG:  The EKG was personally reviewed and demonstrates:  Atrial flutter with variable A-V block, 127 bpm Telemetry:  Telemetry was personally reviewed and demonstrates:  Atrial flutter in the 120's-130's  Relevant CV Studies:  Echocardiogram 01/22/17 Study Conclusions - Left ventricle: The cavity size was normal. Wall thickness was   increased in a pattern of mild LVH. Systolic function was normal.   The estimated ejection fraction was in the range of 50% to 55%.   There is hypokinesis of the apical myocardium. The study is not   technically sufficient to allow evaluation of LV diastolic   function. - Mitral valve: Calcified annulus. There was mild regurgitation. - Left atrium: The atrium was severely dilated. - Right atrium: The atrium was severely dilated. - Pulmonary arteries: Systolic pressure was mildly to moderately   increased. PA peak pressure: 47 mm Hg (S).  Impressions: - Apical hypokinesis with overall low normal LV systolic function;   mild LVH; mild MR; severe biatrial enlargement; mild TR with mild   to moderate elevation in pulmonary pressure.  Laboratory Data:  Chemistry Recent Labs Lab 04/16/17 1125 04/17/17 0318 04/18/17 0801  NA 140 141 141  K 5.3* 3.8 3.8  CL 101 103 101  CO2 22 22 28   GLUCOSE 194* 146* 95  BUN 51* 49* 33*  CREATININE 2.14* 2.07* 1.52*  CALCIUM 8.8* 8.9 9.0  GFRNONAA 24* 25* 36*  GFRAA 28* 29* 42*  ANIONGAP 17* 16* 12     Recent Labs Lab 04/16/17 1125  PROT 6.6  ALBUMIN 3.6  AST 75*  ALT 30  ALKPHOS 76  BILITOT 1.8*   Hematology Recent Labs Lab 04/16/17 1125 04/17/17 0318   WBC  5.3 4.1  RBC 4.32 4.97  HGB 10.8* 11.8*  HCT 35.0* 40.0  MCV 81.0 80.5  MCH 25.0* 23.7*  MCHC 30.9 29.5*  RDW 19.0* 18.8*  PLT 149* 147*   Cardiac Enzymes Recent Labs Lab 04/16/17 1125 04/17/17 0318  TROPONINI 0.03* 0.04*   No results for input(s): TROPIPOC in the last 168 hours.  BNPNo results for input(s): BNP, PROBNP in the last 168 hours.  DDimer No results for input(s): DDIMER in the last 168 hours.  Radiology/Studies:  Dg Lumbar Spine 2-3 Views  Result Date: 04/16/2017 CLINICAL DATA:  Low back pain EXAM: LUMBAR SPINE - 2-3 VIEW COMPARISON:  CT 711 1,018 FINDINGS: Minimal scoliosis. Coarse right upper quadrant calcification may correspond to a liver calcification. Lumbar alignment within normal limits. Chronic superior endplate deformity at L2. Degenerative changes at L5-S1. Remaining vertebral body heights are normal. Atherosclerotic calcifications of the aorta. Calcifications anterior to the spine, likely correspond to the pancreatic calcifications noted on the comparison CT. IMPRESSION: 1. Chronic superior endplate deformity at L2. Moderate degenerative changes at L5-S1 2. No acute osseous abnormality Electronically Signed   By: Donavan Foil M.D.   On: 04/16/2017 17:14   Ct Chest Wo Contrast  Result Date: 04/17/2017 CLINICAL DATA:  Hypoglycemia, weakness atrial fibrillation EXAM: CT CHEST WITHOUT CONTRAST TECHNIQUE: Multidetector CT imaging of the chest was performed following the standard protocol without IV contrast. COMPARISON:  Radiograph 04/17/2017,, 02/22/2017, 07/21/2016 CT abdomen pelvis 01/24/2017 FINDINGS: Cardiovascular: Limited evaluation without intravenous contrast. Nonaneurysmal aorta. Mild atherosclerotic calcifications. Enlarged pulmonary trunk, measuring 3.8 cm with enlarged appearing right and left main pulmonary arteries. Cardiomegaly with biatrial enlargement. No significant pericardial effusion Mediastinum/Nodes: Mediastinal lymph nodes, slightly  enlarged, measuring up to 9 mm in the precarinal space. Midline trachea. No thyroid mass. Esophagus unremarkable Lungs/Pleura: Small to moderate right pleural effusion with loculated fluid along the right pulmonary fissure. Multifocal ground-glass densities and mild consolidations within the bilateral lower lobes and bilateral upper lobes with relative sparing of the right middle lobe. Mild underlying emphysema. Negative for a pneumothorax. Upper Abdomen: Marked atrophy of the pancreas. No acute abnormality. Musculoskeletal: No chest wall mass or suspicious bone lesions identified. IMPRESSION: 1. Cardiomegaly with biatrial enlargement. Enlarged pulmonary arteries, suggesting pulmonary hypertension 2. Moderate bilateral ground-glass densities and scattered areas of consolidation which could reflect multifocal pneumonia or unusual appearance of pulmonary edema. Underlying mild emphysema. 3. Moderate right pleural effusion with loculation along the pulmonary fissure. 4. Mild mediastinal adenopathy, possibly reactive Aortic Atherosclerosis (ICD10-I70.0) and Emphysema (ICD10-J43.9). Electronically Signed   By: Donavan Foil M.D.   On: 04/17/2017 23:18   Portable Chest 1 View  Result Date: 04/17/2017 CLINICAL DATA:  Dyspnea EXAM: PORTABLE CHEST 1 VIEW COMPARISON:  04/16/2017 FINDINGS: Cardiac enlargement. Bilateral perihilar airspace disease is increasing since previous study suggesting increasing pneumonia or edema. Small bilateral pleural effusions also may be increasing. No pneumothorax. Tortuous aorta. Thoracolumbar scoliosis. IMPRESSION: Cardiac enlargement peer at progressing bilateral perihilar airspace disease and small bilateral pleural effusions. Electronically Signed   By: Lucienne Capers M.D.   On: 04/17/2017 02:44   Dg Chest Port 1 View  Result Date: 04/16/2017 CLINICAL DATA:  Shortness of breath. EXAM: PORTABLE CHEST 1 VIEW COMPARISON:  Chest x-ray dated March 23, 2017. FINDINGS: Stable  cardiomegaly. The lungs remain hyperinflated. Unchanged opacity in the right mid and lower lung with adjacent layering pleural fluid in the fissures. No pneumothorax. No acute osseous abnormality. IMPRESSION: 1. Unchanged right mid and lower lung airspace opacity and  adjacent pleural effusion. 2. Stable cardiomegaly. Electronically Signed   By: Titus Dubin M.D.   On: 04/16/2017 11:07    Assessment and Plan:   1. Atrial flutter with rapid ventricular response in setting of suspected acute sepsis illness. Patient has chronic atrial flutter. At home she is treated with beta blocker and amiodarone, for rate control in setting of low blood pressure, not for rhythm control. Currently rates are in the 120's-130's. This is been a repetitive situation with her blood pressure limiting medication use. Pt has had decrease in BP when attempted to increase metoprolol in recent hosp for aflutter with RVR. Currently on 25 mg bid. BP was marginal on admission, elevated yesterday and currenlty SBP is in the 80's. Pt was given metorpolol 25 mg early this am, now dose has been decreased to 12.5 mg. Amiodarone was held today.      -Would continue amiodarone for rate control in the setting of hypotension. Will increase to bid for a couple of days to help rate control.      -Hold metoprolol for now with systolic blood pressure in the 80s. Try to restart later at low dose if BP improves.   CHA2DS2/VAS Stroke Risk Score is at least 6, but the patient has not anticoagulated due to her high risk for falls and bleeding.  2.  Chronic diastolic heart failure: The patient has had multiple hospitalizations for heart failure exacerbation. Most recent echo on 01/22/2017 showed mild LVH with EF 50-55 percent with apical hypokinesis and severe biatrial enlargement, mild MR and TR, mild to moderate elevation in pulmonary pressure. The patient does not wish to be compliant with sodium restrictions. She has failure to thrive and stops eating  if she is not able to use salt. Overall she has a poor prognosis. Home meds included lasix 40 mg daily alternating with 20 mg daily. Currently the patient has crackles in her lung bases and is using supplemental oxygen, but she has no edema and does not appear significantly volume overloaded. Would hold off on diuresis in the setting of hypotension. I discussed sodium restriction with the patient and she is adamant that she will not eat food that is not heavily salted. The daughter understands the rationale and is trying to encourage her mother to reduce her sodium intake.  3. CKD stage III: Serum creatinine was 2.14 on admission, 1.52 today (Around recent baseline)  4. Possible pneumonia: managed by IM, on IV antibiotics.   5. Dementia: on Aricept, Namenda, Seroquel. Patient with poor short-term memory and I think poor understanding of her health and treatment. Patient attends a seniors day program and the son is considering SNF placement according to recent cardiology notes.  6. Seizure disorder: on Lamictal and Keppra   For questions or updates, please contact [place] Please consult [email or web address] for contact info under Cardiology/STEMI.   Signed, Daune Perch, NP  04/18/2017 1:55 PM   I have examined the patient and reviewed assessment and plan and discussed with patient.  Agree with above as stated.  Patient seen and examined with family medicine resident.  Patient with chronic medical issues.  Difficult to rate control for several reasons.  She has poor nutritional status and efforts are being made to get her to eat and drink more.  In addition, she has some low BP issues which limit the titration of her rate control meds.   1)Encourage PO hydration.  2) Will increase amiodarone to 400 mg daily.  Holding metoprolol due to low  BP.    She has evidence of pulmonhary edema on chest CT, diastolic heart failure.  Cr has decreased with fluids.  Difficult situation.  I suspect she will  never have ideal rate control due to her underlying condition.  She is not symptomatic from palpitations.  COntinue supportive care.  Larae Grooms

## 2017-04-18 NOTE — Telephone Encounter (Signed)
Please arrange for a CT of the chest without contrast in 6 weeks.  She has an appointment with me on 11/14.  The CT will need to be done before the appointment.  Call me if you have questions.    Thanks!  Noe Gens, NP-C [facility] Pgr: [phone] or if no answer [phone] 04/18/2017, 4:41 PM

## 2017-04-18 NOTE — Progress Notes (Signed)
Pharmacy Antibiotic Note  Brittany [name] is a 61 y.o. female admitted on 04/16/2017 with sepsis.  Pharmacy has been consulted for Vancomycin and Zosyn dosing.  Today is abx day #3.  Pt is afebrile, WBC wnl.  Renal function has improved.  Will adjust abx doses.  Plan: Vancomycin 500mg  IV every 24 hours.  Goal trough 15-20 mcg/mL. Zosyn 3.375g IV q8h (4 hour infusion).  Height: 5\' 3"  (160 cm) Weight: 92 lb 11.2 oz (42 kg) IBW/kg (Calculated) : 52.4  Temp (24hrs), Avg:98.3 F (36.8 C), Min:97.9 F (36.6 C), Max:98.6 F (37 C)   Recent Labs Lab 04/16/17 1125 04/16/17 1138 04/16/17 1554 04/17/17 0318 04/18/17 0801  WBC 5.3  --   --  4.1  --   CREATININE 2.14*  --   --  2.07* 1.52*  LATICACIDVEN  --  5.52* 2.11*  --  1.5    Estimated Creatinine Clearance: 26.4 mL/min (A) (by C-G formula based on SCr of 1.52 mg/dL (H)).    No Known Allergies  Antimicrobials this admission: Vanc 10/1>> Zosyn 10/1>>  Dose adjustments this admission:   Microbiology results: 10/1 blood x 2 10/2 blood x 2  Thank you for allowing pharmacy to be a part of this patient's care.  [employer], Pharm.D., BCPS Clinical Pharmacist Pager: [phone] Clinical phone for 04/18/2017 from 8:30-4:00 is [phone]. After 4pm, please call Main [facility] [phone]) for assistance. 04/18/2017 3:05 PM

## 2017-04-18 NOTE — Telephone Encounter (Signed)
Order placed for CT.  Nothing further needed.

## 2017-04-19 ENCOUNTER — Inpatient Hospital Stay (HOSPITAL_COMMUNITY): Payer: Medicare (Managed Care)

## 2017-04-19 LAB — BASIC METABOLIC PANEL
Anion gap: 10 (ref 5–15)
BUN: 29 mg/dL — ABNORMAL HIGH (ref 6–20)
CALCIUM: 9.2 mg/dL (ref 8.9–10.3)
CHLORIDE: 101 mmol/L (ref 101–111)
CO2: 28 mmol/L (ref 22–32)
CREATININE: 1.33 mg/dL — AB (ref 0.44–1.00)
GFR, EST AFRICAN AMERICAN: 50 mL/min — AB (ref >60)
GFR, EST NON AFRICAN AMERICAN: 43 mL/min — AB (ref >60)
Glucose, Bld: 90 mg/dL (ref 65–99)
Potassium: 4 mmol/L (ref 3.5–5.1)
SODIUM: 139 mmol/L (ref 135–145)

## 2017-04-19 LAB — CBC
HCT: 34.9 % — ABNORMAL LOW (ref 36.0–46.0)
HEMOGLOBIN: 10.9 g/dL — AB (ref 12.0–15.0)
MCH: 25.4 pg — ABNORMAL LOW (ref 26.0–34.0)
MCHC: 31.2 g/dL (ref 30.0–36.0)
MCV: 81.4 fL (ref 78.0–100.0)
Platelets: 153 10*3/uL (ref 150–400)
RBC: 4.29 MIL/uL (ref 3.87–5.11)
RDW: 19 % — ABNORMAL HIGH (ref 11.5–15.5)
WBC: 4.8 10*3/uL (ref 4.0–10.5)

## 2017-04-19 LAB — GLUCOSE, CAPILLARY: Glucose-Capillary: 100 mg/dL — ABNORMAL HIGH (ref 65–99)

## 2017-04-19 LAB — TSH: TSH: 3.995 u[IU]/mL (ref 0.350–4.500)

## 2017-04-19 LAB — LAMOTRIGINE LEVEL: LAMOTRIGINE LVL: 7.4 ug/mL (ref 2.0–20.0)

## 2017-04-19 MED ORDER — LEVOFLOXACIN 750 MG PO TABS
750.0000 mg | ORAL_TABLET | ORAL | Status: DC
  Administered 2017-04-19: 750 mg via ORAL
  Filled 2017-04-19: qty 1

## 2017-04-19 NOTE — Progress Notes (Signed)
Progress Note  Patient Name: Brittany [name] Date of Encounter: 04/19/2017  Primary Cardiologist: Tamala Julian  Subjective   She feels better today  Inpatient Medications    Scheduled Meds: . amiodarone  200 mg Oral BID  . aspirin EC  81 mg Oral Daily  . donepezil  10 mg Oral QHS  . enoxaparin (LOVENOX) injection  30 mg Subcutaneous Q24H  . feeding supplement (ENSURE ENLIVE)  237 mL Oral TID BM  . lamoTRIgine  100 mg Oral BID  . latanoprost  1 drop Both Eyes QHS  . levETIRAcetam  750 mg Oral BID  . lipase/protease/amylase  12,000 Units Oral TID AC  . mouth rinse  15 mL Mouth Rinse BID  . memantine  5 mg Oral BID   Continuous Infusions: . piperacillin-tazobactam (ZOSYN)  IV Stopped (04/19/17 0830)  . vancomycin     PRN Meds: acetaminophen **OR** acetaminophen   Vital Signs    Vitals:   04/18/17 2203 04/18/17 2329 04/19/17 0121 04/19/17 0409  BP:  100/72  101/85  Pulse:  (!) 136 (!) 129 (!) 131  Resp:  18  18  Temp:    98.5 F (36.9 C)  TempSrc:    Oral  SpO2: 100% 96%  93%  Weight:    93 lb 14.4 oz (42.6 kg)  Height:        Intake/Output Summary (Last 24 hours) at 04/19/17 1106 Last data filed at 04/18/17 1613  Gross per 24 hour  Intake             3.75 ml  Output                0 ml  Net             3.75 ml   Filed Weights   04/16/17 1131 04/18/17 0453 04/19/17 0409  Weight: 92 lb 2.4 oz (41.8 kg) 92 lb 11.2 oz (42 kg) 93 lb 14.4 oz (42.6 kg)    Telemetry    AFlutter, rate improved - Personally Reviewed  ECG     Physical Exam   GEN: No acute distress.   Neck: No JVD Cardiac: irregularly irregular, no murmurs, rubs, or gallops.  Respiratory: Clear to auscultation bilaterally. GI: Soft, nontender, non-distended  MS: No edema; No deformity. Neuro:  Nonfocal  Psych: Normal affect   Labs    Chemistry Recent Labs Lab 04/16/17 1125 04/17/17 0318 04/18/17 0801 04/19/17 0724  NA 140 141 141 139  K 5.3* 3.8 3.8 4.0  CL 101 103 101 101  CO2  22 22 28 28   GLUCOSE 194* 146* 95 90  BUN 51* 49* 33* 29*  CREATININE 2.14* 2.07* 1.52* 1.33*  CALCIUM 8.8* 8.9 9.0 9.2  PROT 6.6  --   --   --   ALBUMIN 3.6  --   --   --   AST 75*  --   --   --   ALT 30  --   --   --   ALKPHOS 76  --   --   --   BILITOT 1.8*  --   --   --   GFRNONAA 24* 25* 36* 43*  GFRAA 28* 29* 42* 50*  ANIONGAP 17* 16* 12 10     Hematology Recent Labs Lab 04/16/17 1125 04/17/17 0318 04/19/17 0724  WBC 5.3 4.1 4.8  RBC 4.32 4.97 4.29  HGB 10.8* 11.8* 10.9*  HCT 35.0* 40.0 34.9*  MCV 81.0 80.5 81.4  MCH 25.0* 23.7* 25.4*  MCHC 30.9 29.5* 31.2  RDW 19.0* 18.8* 19.0*  PLT 149* 147* 153    Cardiac Enzymes Recent Labs Lab 04/16/17 1125 04/17/17 0318  TROPONINI 0.03* 0.04*   No results for input(s): TROPIPOC in the last 168 hours.   BNPNo results for input(s): BNP, PROBNP in the last 168 hours.   DDimer No results for input(s): DDIMER in the last 168 hours.   Radiology    Ct Chest Wo Contrast  Result Date: 04/17/2017 CLINICAL DATA:  Hypoglycemia, weakness atrial fibrillation EXAM: CT CHEST WITHOUT CONTRAST TECHNIQUE: Multidetector CT imaging of the chest was performed following the standard protocol without IV contrast. COMPARISON:  Radiograph 04/17/2017,, 02/22/2017, 07/21/2016 CT abdomen pelvis 01/24/2017 FINDINGS: Cardiovascular: Limited evaluation without intravenous contrast. Nonaneurysmal aorta. Mild atherosclerotic calcifications. Enlarged pulmonary trunk, measuring 3.8 cm with enlarged appearing right and left main pulmonary arteries. Cardiomegaly with biatrial enlargement. No significant pericardial effusion Mediastinum/Nodes: Mediastinal lymph nodes, slightly enlarged, measuring up to 9 mm in the precarinal space. Midline trachea. No thyroid mass. Esophagus unremarkable Lungs/Pleura: Small to moderate right pleural effusion with loculated fluid along the right pulmonary fissure. Multifocal ground-glass densities and mild consolidations within  the bilateral lower lobes and bilateral upper lobes with relative sparing of the right middle lobe. Mild underlying emphysema. Negative for a pneumothorax. Upper Abdomen: Marked atrophy of the pancreas. No acute abnormality. Musculoskeletal: No chest wall mass or suspicious bone lesions identified. IMPRESSION: 1. Cardiomegaly with biatrial enlargement. Enlarged pulmonary arteries, suggesting pulmonary hypertension 2. Moderate bilateral ground-glass densities and scattered areas of consolidation which could reflect multifocal pneumonia or unusual appearance of pulmonary edema. Underlying mild emphysema. 3. Moderate right pleural effusion with loculation along the pulmonary fissure. 4. Mild mediastinal adenopathy, possibly reactive Aortic Atherosclerosis (ICD10-I70.0) and Emphysema (ICD10-J43.9). Electronically Signed   By: Donavan Foil M.D.   On: 04/17/2017 23:18    Cardiac Studies     Patient Profile     61 y.o. female with atrial flutter, rate controlled  Assessment & Plan    Improved with increased Amio.  Add back low dose metoprolol as BP allows.  D/w resident.  Dilt would likely lower BP too much.  Encourage PO intake.  She is in need of supplementation to keep albumin in the normal range.  For questions or updates, please contact [place] Please consult [email or web address] for contact info under Cardiology/STEMI.      Signed, Larae Grooms, MD  04/19/2017, 11:06 AM

## 2017-04-19 NOTE — Progress Notes (Signed)
Modified Barium Swallow Progress Note  Patient Details  Name: Brittany [name] MRN: [id number] Date of Birth: July 10, 1956  Today's Date: 04/19/2017  Modified Barium Swallow completed.  Full report located under Chart Review in the Imaging Section.  Brief recommendations include the following:  Clinical Impression  Pt's oropharyngeal swallow is [facility] with no aspiration observed despite challenging with multiple large, sequential straw sips of thin liquids and large solid boluses without dentures in place. There could be the potential for her to have episodic aspiration in the setting of dyspnea and/or given her cognitive status, but this could not be elicited during testing today. Recommend that she continue with a regular diet and thin liquids. No additional SLP f/u indicated.   Swallow Evaluation Recommendations       SLP Diet Recommendations: Regular solids;Thin liquid   Liquid Administration via: Cup;Straw   Medication Administration: Whole meds with liquid   Supervision: Patient able to self feed;Intermittent supervision to cue for compensatory strategies   Compensations: Slow rate;Small sips/bites   Postural Changes: Seated upright at 90 degrees   Oral Care Recommendations: Oral care QID (given concern for respiratory status)        Germain Osgood 04/19/2017,2:51 PM   Germain Osgood, M.A. CCC-SLP [phone]

## 2017-04-19 NOTE — Evaluation (Addendum)
Occupational Therapy Evaluation Patient Details Name: Brittany [name] MRN: [id number] DOB: Dec 23, 1955 Today's Date: 04/19/2017    History of Present Illness Brittany [name] is a 61 y.o. female presenting with hypoglycemia, hypothermia and weakness . PMH is significant for frequent falls, chronic diastolic heart failure, persistent atrial fibrillation/flutter, CKD3, HTN, seizure, dementia, COPD, and CVA.   Clinical Impression   This 61 y/o F presents with the above. At baseline Pt is independent-mod independent with ADLs and functional mobility. Pt completed room level functional mobility, seated and standing UB/LB ADLs with overall MinA this session, with limitations including generalized weakness, decreased dynamic balance. [facility], lives with family who are available for 24 hr assist. Pt will benefit from continued OT services in acute setting to progress Pt's safety and independence with ADLs and functional mobility prior to return home.     Follow Up Recommendations  No OT follow up;Supervision/Assistance - 24 hour    Equipment Recommendations  None recommended by OT           Precautions / Restrictions Precautions Precautions: Fall Restrictions Weight Bearing Restrictions: No      Mobility Bed Mobility Overal bed mobility: Needs Assistance Bed Mobility: Supine to Sit;Sit to Supine     Supine to sit: Supervision;HOB elevated Sit to supine: Supervision;HOB elevated   General bed mobility comments: HOB elevated, supervision for safety   Transfers Overall transfer level: Needs assistance   Transfers: Sit to/from Stand Sit to Stand: Min assist         General transfer comment: cues for hand placement, assist to steady upon rising     Balance Overall balance assessment: Needs assistance Sitting-balance support: No upper extremity supported;Single extremity supported Sitting balance-Leahy Scale: Fair     Standing balance support: No upper extremity  supported;Single extremity supported;During functional activity Standing balance-Leahy Scale: Fair Standing balance comment: though as stated much more steady with L lift shoe.                           ADL either performed or assessed with clinical judgement   ADL Overall ADL's : Needs assistance/impaired Eating/Feeding: Set up;Sitting   Grooming: Standing;Minimal assistance Grooming Details (indicate cue type and reason): min steady assist provided in standing  Upper Body Bathing: Min guard;Sitting   Lower Body Bathing: Minimal assistance;Sit to/from stand   Upper Body Dressing : Min guard;Sitting   Lower Body Dressing: Minimal assistance;Sit to/from stand Lower Body Dressing Details (indicate cue type and reason): Pt dons shoes sitting EOB Toilet Transfer: Minimal assistance;Ambulation;Regular Toilet;Grab bars   Toileting- Clothing Manipulation and Hygiene: Minimal assistance;Sit to/from stand Toileting - Clothing Manipulation Details (indicate cue type and reason): steady assist provided in standing while Pt completes perihygiene after BM      Functional mobility during ADLs: Minimal assistance General ADL Comments: Max HR 119/120 during session; Pt wearing lift shoes during session completion      Vision Baseline Vision/History: Wears glasses Wears Glasses: At all times Patient Visual Report: No change from baseline Vision Assessment?: No apparent visual deficits                Pertinent Vitals/Pain Pain Assessment: No/denies pain Faces Pain Scale: No hurt     Hand Dominance Right   Extremity/Trunk Assessment Upper Extremity Assessment Upper Extremity Assessment: Generalized weakness   Lower Extremity Assessment Lower Extremity Assessment: Defer to PT evaluation       Communication Communication Communication: No difficulties  Cognition Arousal/Alertness: Awake/alert Behavior During Therapy: WFL for tasks assessed/performed Overall  Cognitive Status: History of cognitive impairments - at baseline                                                      Home Living Family/patient expects to be discharged to:: Private residence Living Arrangements: Children Available Help at Discharge: Family;Available 24 hours/day Type of Home: House Home Access: Stairs to enter [employer] of Steps: 2 Entrance Stairs-Rails: Can reach both Home Layout: Two level;Bed/bath upstairs Alternate Level Stairs-Number of Steps: flight   Bathroom Shower/Tub: Teacher, early years/pre: Standard     Home Equipment: Environmental consultant - 2 wheels;Cane - single point;Bedside commode;Shower seat;Grab bars - tub/shower                    Comments: Uses cane for longer distance, often nothing in the home.  Pt attends Adult day program 5x/week with [facility]         OT Problem List: Decreased strength;Impaired balance (sitting and/or standing);Decreased activity tolerance      OT Treatment/Interventions: Self-care/ADL training;DME and/or AE instruction;Therapeutic activities;Balance training;Therapeutic exercise;Patient/family education    OT Goals(Current goals can be found in the care plan section) Acute Rehab OT Goals Patient Stated Goal: get home.  able to walk better OT Goal Formulation: With patient Time For Goal Achievement: 05/03/17 Potential to Achieve Goals: Good  OT Frequency: Min 2X/week                             AM-PAC PT "6 Clicks" Daily Activity     Outcome Measure Help from another person eating meals?: None Help from another person taking care of personal grooming?: A Little Help from another person toileting, which includes using toliet, bedpan, or urinal?: A Little Help from another person bathing (including washing, rinsing, drying)?: A Little Help from another person to put on and taking off regular upper body clothing?: A Little Help from another person to put on and  taking off regular lower body clothing?: A Lot 6 Click Score: 18   End of Session Equipment Utilized During Treatment: Gait belt Nurse Communication: Mobility status  Activity Tolerance: Patient tolerated treatment well Patient left: in bed;with call bell/phone within reach;with bed alarm set  OT Visit Diagnosis: Unsteadiness on feet (R26.81);Muscle weakness (generalized) (M62.81)                Time: 4825-0037 OT Time Calculation (min): 18 min Charges:  OT General Charges $OT Visit: 1 Visit OT Evaluation $OT Eval Low Complexity: 1 Low G-Codes:     Lou Cal, OT Pager [phone] 04/19/2017   Raymondo Band 04/19/2017, 4:11 PM

## 2017-04-19 NOTE — Progress Notes (Signed)
At around 4am, this RN was notified by the CMT (Tele-monitoring system), that the pt had an unsustained HR of 202. Vital signs were completed, and bedside assed HR was in the 130's. Resident on call and Cardiologist on call were notified. Cardiologist advised to go on ahead with the second dose of the digoxin as long as that tachycardic HR of 202 was unsustained. Resident came up to the floor to assess the pt. Pt was assessed to be asymptomatic. Will continue to monitor.

## 2017-04-19 NOTE — Progress Notes (Signed)
Pt PM Vitals showed a HR of 130's. MD resident on call was notified. She called back with orders from Cardiology to give digoxin. Medication given. Will continue to monitor.

## 2017-04-19 NOTE — Progress Notes (Signed)
Family Medicine Teaching Service Daily Progress Note Intern Pager: [phone]  Patient name: Brittany [name] Medical record number: [id number] Date of birth: 27-Mar-1956 Age: 61 y.o. Gender: female  Primary Care Provider: System, Pcp Not In Consultants: cardio/ccm Code Status: full  Pt Overview and Major Events to Date:  Brittany [name] is a 61 y.o. female presenting with hypoglycemia, hypothermia and weakness . PMH is significant for frequent falls, chronic diastolic heart failure, persistent atrial fibrillation/flutter, CKD3, HTN, seizure, dementia, COPD, and CVA.  Major events include respiratory decompensation evening of 10/1 which appears to have been fluid overload in pulmonary system addressed with lasix diuresis.  Assessment and Plan: Brittany [name] is a 61 y.o. female presenting with hypoglycemia, hypothermia and weakness . PMH is significant for frequent falls, chronic diastolic heart failure, persistent atrial fibrillation/flutter, CKD3, HTN, seizure, dementia, COPD, and CVA.  Sepsis presentation: CXR showed change right mid and lower lung airspace opacity with adjacent pleural effusion and stable cardiomegaly with minimal concern for pneumonia. Patient was started on broad-spectrum antibiotics. UA neg.  No leukocytosis noted on CBC making infectious process or septic presentation less likely. Afebrile overnight --Admit patient to have MTS, admitting physician Dr. Dorcas Mcmurray - d/c vanc/zosin -begin levaquin for 8 days  Respiratory status/emphysema: Rapid called 10/1 overnight due to respiratory distress.   IV fluids had been d/c'd prior out of concern for fluid overload and crackles in lungs.   On evaluation in room, patient had significantly increased crackles, IV lasix was administered and patient recovered.  CT showed likely pulmonary hypertension, ground glass densities (pneumonia or edema) R pleural effusion along fissure, mediatinal adenopahty, aortic atherosclerosis and  emphysema. -hold IV fluids -O2 monitoring -esr,crp,ana,anca,rf,scl70,ace ordered -consider IR thoracentesis, likely not a VATS candidate -pulm will see in 6wks for followup  Diastolic CHF: Son says she has had consistent problems with salt/fluid intake in the past.  BNP 1821.  Last echo 7/18 showed 50-55%EF. Patient was recently (8/09) seen by cardiologist Dr. Kirk Ruths and had a BNP of 1800 seems to be her baseline.  Lasix was recently increased to 40 mg daily  and metoprolol 37.5 mg twice a day home dose. -holding diuretics for now -metoprolol d/c'd due to blood pressure - daily weights -Monitor I's and O's  Acute on chronic  kidney disease stage III: still making urine, follows with urology. Patient is pre-dialysis. Creatinine 2.07 above baseline of 1.5-1.98 earlier this year. Elevated creatinine likely secondary to hypovolemia versus worsening kidney function -strict I/O -Follow-up on a.m. BMP -furosemide being given on single doses to balance nephro/resp concerns  Afib/A flutter- patient followed by cardiology. Patient is rate controlled with amiodarone and metoprolol. Patient is currently not on anticoagulation due to increased risk of bleeding with history of frequent falls. Patient has multiple afib treatments in their past medical record.  HR was consistently irregular on exam by multiple providers. EKG on admission showed atrial fibrillation/atrial flutter. -amiodarone 200 mg BID temporarily (home dose 200daily) -hold metoprolol due to hypotension -2x doses of digoxin given due to tachycardia overnigth 10/3  AMS/Dementia- at baseline per family, discussed potential of editing dementia medications with family who is in agreement with developing proposal to discuss with [facility] - d/c home seroquel - decrease home memantine to 29m from 133m- home donepezil -ECG/cardiac monitor  Back/flank pain- patient describes pain as "in her kidneys" but has no CVA tenderness. She  located pain to lumbar region between spine and PSIS on R side.  Distal sensation and motor strength intact.  Denies urinary symptoms but is demented.  Lumbar spine XR neg for acute process, UA neg -Acetaminophen 650 mg q6prn  Hypoglycemia: resolve -Will continue to monitor -routine CBGs  Pancreatitis History of pancreatitis on Creon will hold while inpatient. Will resume  when appropriate  Seizure disorder Appears to be well controlled on home medication. No seizure activity reported in the setting of hypoglycemia. Will continue home regimen. 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 -Agree to try plan of letting her add some salt to food.   Expressed that weight loss is a serious life risk for her and that even salty food is better than no food at this point. -instructed nursing to allow a few salt packets per meal if she is refusing the meal tray  Pain control- -tylenol   FEN/GI: Heart healthy diet, pantoprazole,  Prophylaxis: lovenox/aspirin 3m  Disposition: Home pending clinical improvement  Subjective:  She was feeling better and everything was fine but accuracy is questionable due to mental state.   Agrees to try and eat her meals to avoid weight loss if we allow her some salt, repeats "if it don't taste good, I'm not eating it"  Objective: Temp:  [98.5 F (36.9 C)-98.7 F (37.1 C)] 98.5 F (36.9 C) (10/04 0409) Pulse Rate:  [104-136] 131 (10/04 0409) Resp:  [18] 18 (10/04 0409) BP: (82-101)/(60-85) 101/85 (10/04 0409) SpO2:  [93 %-100 %] 93 % (10/04 0409) Weight:  [93 lb 14.4 oz (42.6 kg)] 93 lb 14.4 oz (42.6 kg) (10/04  0409) Physical Exam: General: frail but alert woman, personable and interactive ENTM: R sided buccal lesions, white with no indication of bleeding (not painful to patient)            Neck: FROM, no complaint of neck pain Cardiovascular: irregular rate/rhythm, no edema in LE bilaterally Respiratory: crackles throughout lung fields bilaterally, no visible IWB Gastrointestinal: no complaint fo belly pain on palpation, aortic pulse palpable, bowel sounds present MSK: no deficits noted while laying down, can lift/move arms/legs at will Neuro: some speech slurring, strength and sensation grossly intact bilaterally upper and lower extremity  Psych: AOx2 (not time), pleasant/interactive  Laboratory:  Recent Labs Lab 04/16/17 1125 04/17/17 0318  WBC 5.3 4.1  HGB 10.8* 11.8*  HCT 35.0* 40.0  PLT 149* 147*    Recent Labs Lab 04/16/17 1125 04/17/17 0318 04/18/17 0801  NA 140 141 141  K 5.3* 3.8 3.8  CL 101 103 101  CO2 _0 BUN 51* 49* 33*  CREATININE 2.14* 2.07* 1.52*  CALCIUM 8.8* 8.9 9.0  PROT 6.6  --   --   BILITOT 1.8*  --   --   ALKPHOS 76  --   --   ALT 30  --   --   AST 75*  --   --   GLUCOSE 194* 146* 95    procalcitonin 2.77  Imaging/Diagnostic Tests: Dg Lumbar Spine 2-3 Views  Result Date: 04/16/2017 CLINICAL DATA:  Low back pain EXAM: LUMBAR SPINE - 2-3 VIEW COMPARISON:  CT 711 1,018 FINDINGS: Minimal scoliosis. Coarse right upper quadrant calcification may correspond to a liver calcification. Lumbar alignment within normal limits.  Chronic superior endplate deformity at L2. Degenerative changes at L5-S1. Remaining vertebral body heights are normal. Atherosclerotic calcifications of the aorta. Calcifications anterior to the spine, likely correspond to the pancreatic calcifications noted on the comparison CT. IMPRESSION: 1. Chronic superior endplate deformity at L2. Moderate degenerative changes at L5-S1 2. No acute osseous abnormality Electronically Signed   By:  Donavan Foil M.D.   On: 04/16/2017 17:14   Portable Chest 1 View  Result Date: 04/17/2017 CLINICAL DATA:  Dyspnea EXAM: PORTABLE CHEST 1 VIEW COMPARISON:  04/16/2017 FINDINGS: Cardiac enlargement. Bilateral perihilar airspace disease is increasing since previous study suggesting increasing pneumonia or edema. Small bilateral pleural effusions also may be increasing. No pneumothorax. Tortuous aorta. Thoracolumbar scoliosis. IMPRESSION: Cardiac enlargement peer at progressing bilateral perihilar airspace disease and small bilateral pleural effusions. Electronically Signed   By: Lucienne Capers M.D.   On: 04/17/2017 02:44   Dg Chest Port 1 View  Result Date: 04/16/2017 CLINICAL DATA:  Shortness of breath. EXAM: PORTABLE CHEST 1 VIEW COMPARISON:  Chest x-ray dated March 23, 2017. FINDINGS: Stable cardiomegaly. The lungs remain hyperinflated. Unchanged opacity in the right mid and lower lung with adjacent layering pleural fluid in the fissures. No pneumothorax. No acute osseous abnormality. IMPRESSION: 1. Unchanged right mid and lower lung airspace opacity and adjacent pleural effusion. 2. Stable cardiomegaly. Electronically Signed   By: Titus Dubin M.D.   On: 04/16/2017 11:07     Sherene Sires, DO 04/19/2017, 6:36 AM PGY-1, [place] Intern pager: [phone], text pages welcome

## 2017-04-19 NOTE — Evaluation (Signed)
Physical Therapy Evaluation Patient Details Name: Brittany [name] MRN: [id number] DOB: 1956/04/28 Today's Date: 04/19/2017   History of Present Illness  Brittany [name] is a 61 y.o. female presenting with hypoglycemia, hypothermia and weakness . PMH is significant for frequent falls, chronic diastolic heart failure, persistent atrial fibrillation/flutter, CKD3, HTN, seizure, dementia, COPD, and CVA.  Clinical Impression  Pt admitted with/for complications stated above.  Presently pt needing minimal assist for basic mobility and gait stability.  Pt currently limited functionally due to the problems listed below.  (see problems list.)  Pt will benefit from PT to maximize function and safety to be able to get home safely with available assist of family.     Follow Up Recommendations Home health PT;Supervision for mobility/OOB    Equipment Recommendations       Recommendations for Other Services       Precautions / Restrictions Precautions Precautions: Fall      Mobility  Bed Mobility               General bed mobility comments: OOB in recliner  Transfers Overall transfer level: Needs assistance   Transfers: Sit to/from Stand Sit to Stand: Min assist         General transfer comment: cues for hand placement, cues for control and assist to control descent  Ambulation/Gait Ambulation/Gait assistance: Min assist Ambulation Distance (Feet): 60 Feet (then 150 after shoes donned) Assistive device: Straight cane Gait Pattern/deviations: Step-through pattern;Decreased step length - right;Decreased step length - left Gait velocity: slower Gait velocity interpretation: Below normal speed for age/gender General Gait Details: gait without shoes described as staggery with R knee consistently flexed due to leg length discrepancy.  With shoes, much more steady with lighter minimal stability as she fatigued.  Stairs            Wheelchair Mobility    Modified Rankin  (Stroke Patients Only)       Balance Overall balance assessment: Needs assistance Sitting-balance support: No upper extremity supported;Single extremity supported Sitting balance-Leahy Scale: Fair     Standing balance support: No upper extremity supported;Single extremity supported Standing balance-Leahy Scale: Fair Standing balance comment: though as stated much more steady with L lift shoe.                             Pertinent Vitals/Pain Pain Assessment: Faces Faces Pain Scale: No hurt    Home Living Family/patient expects to be discharged to:: Private residence Living Arrangements: Children Available Help at Discharge: Family;Available 24 hours/day Type of Home: House Home Access: Stairs to enter Entrance Stairs-Rails: Can reach both Entrance Stairs-Number of Steps: 2 Home Layout: Two level;Bed/bath upstairs Home Equipment: Walker - 2 wheels;Cane - single point      Prior Function           Comments: Uses cane for longer distance, often nothing in the home.  Pt attends Adult day program 5x/week with [facility]      Hand Dominance   Dominant Hand: Right    Extremity/Trunk Assessment   Upper Extremity Assessment Upper Extremity Assessment: Generalized weakness    Lower Extremity Assessment Lower Extremity Assessment: Overall WFL for tasks assessed (general weakness proximally and core, but functional)       Communication   Communication: No difficulties  Cognition Arousal/Alertness: Awake/alert Behavior During Therapy: WFL for tasks assessed/performed Overall Cognitive Status: History of cognitive impairments - at baseline  General Comments      Exercises     Assessment/Plan    PT Assessment Patient needs continued PT services  PT Problem List Decreased strength;Decreased activity tolerance;Decreased balance;Decreased mobility;Decreased knowledge of use of DME;Decreased safety  awareness       PT Treatment Interventions DME instruction;Gait training;Stair training;Functional mobility training;Therapeutic activities;Balance training;Patient/family education    PT Goals (Current goals can be found in the Care Plan section)  Acute Rehab PT Goals Patient Stated Goal: get home.  able to walk better PT Goal Formulation: With patient Time For Goal Achievement: 04/26/17 Potential to Achieve Goals: Good    Frequency Min 3X/week   Barriers to discharge        Co-evaluation               AM-PAC PT "6 Clicks" Daily Activity  Outcome Measure Difficulty turning over in bed (including adjusting bedclothes, sheets and blankets)?: A Little Difficulty moving from lying on back to sitting on the side of the bed? : A Little Difficulty sitting down on and standing up from a chair with arms (e.g., wheelchair, bedside commode, etc,.)?: A Little Help needed moving to and from a bed to chair (including a wheelchair)?: A Little Help needed walking in hospital room?: A Little Help needed climbing 3-5 steps with a railing? : A Little 6 Click Score: 18    End of Session   Activity Tolerance: Patient tolerated treatment well Patient left: in chair;with call bell/phone within reach;with chair alarm set;with family/visitor present Nurse Communication: Mobility status PT Visit Diagnosis: Unsteadiness on feet (R26.81);Other abnormalities of gait and mobility (R26.89)    Time: 6767-2094 PT Time Calculation (min) (ACUTE ONLY): 30 min   Charges:   PT Evaluation $PT Eval Moderate Complexity: 1 Mod PT Treatments $Gait Training: 8-22 mins   PT G Codes:        04-23-17  Donnella Sham, PT [phone]  (pager)  Brittany [name] 2017-04-23, 3:47 PM

## 2017-04-19 NOTE — Progress Notes (Signed)
This RN was called by CMT staff that pt telemetry monitoring leads had been wrongly placed on her end which was generating the Tachycardic artifacts rhythms that had occurred on the ECG strips. This is an Pharmacist, hospital. Will continue to monitor.

## 2017-04-20 LAB — LEVETIRACETAM LEVEL: Levetiracetam Lvl: 86 ug/mL — ABNORMAL HIGH (ref 10.0–40.0)

## 2017-04-20 LAB — CBC
HCT: 33.4 % — ABNORMAL LOW (ref 36.0–46.0)
HEMOGLOBIN: 9.9 g/dL — AB (ref 12.0–15.0)
MCH: 24 pg — AB (ref 26.0–34.0)
MCHC: 29.6 g/dL — AB (ref 30.0–36.0)
MCV: 81.1 fL (ref 78.0–100.0)
Platelets: 165 10*3/uL (ref 150–400)
RBC: 4.12 MIL/uL (ref 3.87–5.11)
RDW: 18.3 % — AB (ref 11.5–15.5)
WBC: 4.7 10*3/uL (ref 4.0–10.5)

## 2017-04-20 LAB — BASIC METABOLIC PANEL
Anion gap: 10 (ref 5–15)
BUN: 24 mg/dL — AB (ref 6–20)
CALCIUM: 9.3 mg/dL (ref 8.9–10.3)
CHLORIDE: 100 mmol/L — AB (ref 101–111)
CO2: 29 mmol/L (ref 22–32)
CREATININE: 1.29 mg/dL — AB (ref 0.44–1.00)
GFR calc non Af Amer: 44 mL/min — ABNORMAL LOW (ref >60)
GFR, EST AFRICAN AMERICAN: 51 mL/min — AB (ref >60)
Glucose, Bld: 97 mg/dL (ref 65–99)
Potassium: 4.3 mmol/L (ref 3.5–5.1)
SODIUM: 139 mmol/L (ref 135–145)

## 2017-04-20 LAB — GLUCOSE, CAPILLARY: GLUCOSE-CAPILLARY: 95 mg/dL (ref 65–99)

## 2017-04-20 MED ORDER — AMIODARONE HCL 200 MG PO TABS: 200.0000 mg | ORAL_TABLET | Freq: Two times a day (BID) | ORAL | 0 refills | Status: AC

## 2017-04-20 MED ORDER — LEVOFLOXACIN 750 MG PO TABS: 750.0000 mg | ORAL_TABLET | ORAL | 0 refills | Status: DC

## 2017-04-20 MED ORDER — ENSURE ENLIVE PO LIQD: 237.0000 mL | Freq: Three times a day (TID) | ORAL | 12 refills | Status: AC

## 2017-04-20 MED ORDER — ASPIRIN 81 MG PO TBEC: 81.0000 mg | DELAYED_RELEASE_TABLET | Freq: Every day | ORAL | 0 refills | Status: AC

## 2017-04-20 MED ORDER — MEMANTINE HCL 5 MG PO TABS: 5.0000 mg | ORAL_TABLET | Freq: Two times a day (BID) | ORAL | 0 refills | Status: AC

## 2017-04-20 MED ORDER — PANCRELIPASE (LIP-PROT-AMYL) 12000-38000 UNITS PO CPEP: 12000.0000 [IU] | ORAL_CAPSULE | Freq: Three times a day (TID) | ORAL | 0 refills | Status: AC

## 2017-04-20 NOTE — Progress Notes (Signed)
Family Medicine Teaching Service Daily Progress Note Intern Pager: [phone]  Patient name: Brittany [name] Medical record number: [id number] Date of birth: 03-13-1956 Age: 61 y.o. Gender: female  Primary Care Provider: System, Pcp Not In Consultants: cardio/ccm Code Status: full  Pt Overview and Major Events to Date:  Brittany [name] is a 61 y.o. female presenting with hypoglycemia, hypothermia and weakness . PMH is significant for frequent falls, chronic diastolic heart failure, persistent atrial fibrillation/flutter, CKD3, HTN, seizure, dementia, COPD, and CVA.  Major events include respiratory decompensation evening of 10/1 which appears to have been fluid overload in pulmonary system addressed with lasix diuresis.  Tachycardia ~200 addressed with cardiology consult d/c'd metoprolol, increase amiodorone, 2x doses of digoxin  Assessment and Plan: Brittany [name] is a 61 y.o. female presenting with hypoglycemia, hypothermia and weakness . PMH is significant for frequent falls, chronic diastolic heart failure, persistent atrial fibrillation/flutter, CKD3, HTN, seizure, dementia, COPD, and CVA.  Sepsis presentation: CXR showed change right mid and lower lung airspace opacity with adjacent pleural effusion and stable cardiomegaly with minimal concern for pneumonia. Patient was started on broad-spectrum antibiotics. UA neg.  No leukocytosis noted on CBC making infectious process or septic presentation less likely. Afebrile overnight --Admit patient to have MTS, admitting physician Dr. Dorcas Mcmurray - d/c vanc/zosin -begin levaquin for 8 days  Respiratory status/emphysema: Rapid called 10/1 overnight due to respiratory distress.   IV fluids had been d/c'd prior out of concern for fluid overload and crackles in lungs.   On evaluation in room, patient had significantly increased crackles, IV lasix was administered and patient recovered.  CT showed likely pulmonary hypertension, ground glass densities  (pneumonia or edema) R pleural effusion along fissure, mediatinal adenopahty, aortic atherosclerosis and emphysema. -hold IV fluids -O2 monitoring -esr,crp,ana,anca,rf,scl70,ace pending -consider IR thoracentesis, likely not a VATS candidate -pulm will see in 6wks for followup  Diastolic CHF: Son says she has had consistent problems with salt/fluid intake in the past.  BNP 1821.  Last echo 7/18 showed 50-55%EF. Patient was recently (8/09) seen by cardiologist Dr. Kirk Ruths and had a BNP of 1800 seems to be her baseline.  Lasix was recently increased to 40 mg daily  and metoprolol 37.5 mg twice a day home dose. -holding diuretics for now -metoprolol d/c'd due to blood pressure - daily weights -Monitor I's and O's  Acute on chronic  kidney disease stage III: still making urine, follows with urology. Patient is pre-dialysis. Creatinine 2.07 above baseline of 1.5-1.98 earlier this year. Elevated creatinine likely secondary to hypovolemia versus worsening kidney function -strict I/O -Follow-up on a.m. BMP -furosemide being given on single doses to balance nephro/resp concerns  Afib/A flutter- patient followed by cardiology. Patient is rate controlled with amiodarone and metoprolol. Patient is currently not on anticoagulation due to increased risk of bleeding with history of frequent falls. Patient has multiple afib treatments in their past medical record.  HR was consistently irregular on exam by multiple providers. EKG on admission showed atrial fibrillation/atrial flutter. -amiodarone 200 mg BID temporarily (home dose 200daily) -hold metoprolol due to hypotension -2x doses of digoxin given due to tachycardia overnigth 10/3  AMS/Dementia- at baseline per family, discussed potential of editing dementia medications with family who is in agreement with developing proposal to discuss with [facility] - d/c home seroquel - decrease home memantine to 40m from 129m- home donepezil -ECG/cardiac  monitor  Back/flank pain- patient describes pain as "in her kidneys" but has no CVA tenderness. She located pain to lumbar  region between spine and PSIS on R side.  Distal sensation and motor strength intact.  Denies urinary symptoms but is demented.  Lumbar spine XR neg for acute process, UA neg -Acetaminophen 650 mg q6prn  Hypoglycemia: resolve -Will continue to monitor -routine CBGs  Pancreatitis History of pancreatitis on Creon will hold while inpatient. Will resume  when appropriate  Seizure disorder Appears to be well controlled on home medication. No seizure activity reported in the setting of hypoglycemia. Will continue home regimen. 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 -Agree to try plan of letting her add some salt to food.   Expressed that weight loss is a serious life risk for her and that even salty food is better than no food at this point. -instructed nursing to allow a few salt packets per meal if she is refusing the meal tray  Pain control- -tylenol   FEN/GI: Heart healthy diet, pantoprazole,  Prophylaxis: lovenox/aspirin 87m  Disposition: Home pending clinical improvement (home PT ordered)  Subjective:  Patient would like to go home and feels better.  Only new complaint is loose stool x1 with no sign of blood in stool.  Objective: Temp:  [97.4 F (36.3 C)-97.8 F (36.6 C)] 97.4 F (36.3 C) (10/05 0500) Pulse Rate:  [75-111] 96 (10/05 0500) Resp:  [16-18] 18 (10/05 0500) BP: (88-103)/(60-73) 103/73 (10/05 0500) SpO2:  [96 %-100 %] 98 % (10/05 0500) Weight:  [102 lb (46.3 kg)] 102 lb (46.3 kg) (10/05  0500) Physical Exam: General: frail but alert woman, personable and interactive ENTM: R sided buccal lesions, white with no indication of bleeding (not painful to patient)            Neck: FROM, no complaint of neck pain Cardiovascular: irregular rate/rhythm, no edema in LE bilaterally Respiratory: crackles throughout lung fields bilaterally, no visible IWB Gastrointestinal: no complaint fo belly pain on palpation, aortic pulse palpable, bowel sounds present MSK: no deficits noted while laying down, can lift/move arms/legs at will Neuro: some speech slurring, strength and sensation grossly intact bilaterally upper and lower extremity  Psych: AOx2 (not time), pleasant/interactive  Laboratory:  Recent Labs Lab 04/17/17 0318 04/19/17 0724 04/20/17 0553  WBC 4.1 4.8 4.7  HGB 11.8* 10.9* 9.9*  HCT 40.0 34.9* 33.4*  PLT 147* 153 165    Recent Labs Lab 04/16/17 1125 04/17/17 0318 04/18/17 0801 04/19/17 0724  NA 140 141 141 139  K 5.3* 3.8 3.8 4.0  CL 101 103 101 101  CO2 22 22 28 28   BUN 51* 49* 33* 29*  CREATININE 2.14* 2.07* 1.52* 1.33*  CALCIUM 8.8* 8.9 9.0 9.2  PROT 6.6  --   --   --   BILITOT 1.8*  --   --   --   ALKPHOS 76  --   --   --   ALT 30  --   --   --   AST 75*  --   --   --   GLUCOSE 194* 146* 95 90    procalcitonin 2.77  Imaging/Diagnostic Tests: Dg Lumbar Spine 2-3 Views  Result Date: 04/16/2017 CLINICAL DATA:  Low back pain EXAM: LUMBAR SPINE - 2-3  VIEW COMPARISON:  CT 711 1,018 FINDINGS: Minimal scoliosis. Coarse right upper quadrant calcification may correspond to a liver calcification. Lumbar alignment within normal limits. Chronic superior endplate deformity at L2. Degenerative changes at L5-S1. Remaining vertebral body heights are normal. Atherosclerotic calcifications of the aorta. Calcifications anterior to the spine, likely correspond to the pancreatic calcifications noted on the comparison CT. IMPRESSION: 1. Chronic superior endplate deformity at  L2. Moderate degenerative changes at L5-S1 2. No acute osseous abnormality Electronically Signed   By: Donavan Foil M.D.   On: 04/16/2017 17:14   Portable Chest 1 View  Result Date: 04/17/2017 CLINICAL DATA:  Dyspnea EXAM: PORTABLE CHEST 1 VIEW COMPARISON:  04/16/2017 FINDINGS: Cardiac enlargement. Bilateral perihilar airspace disease is increasing since previous study suggesting increasing pneumonia or edema. Small bilateral pleural effusions also may be increasing. No pneumothorax. Tortuous aorta. Thoracolumbar scoliosis. IMPRESSION: Cardiac enlargement peer at progressing bilateral perihilar airspace disease and small bilateral pleural effusions. Electronically Signed   By: Lucienne Capers M.D.   On: 04/17/2017 02:44   Dg Chest Port 1 View  Result Date: 04/16/2017 CLINICAL DATA:  Shortness of breath. EXAM: PORTABLE CHEST 1 VIEW COMPARISON:  Chest x-ray dated March 23, 2017. FINDINGS: Stable cardiomegaly. The lungs remain hyperinflated. Unchanged opacity in the right mid and lower lung with adjacent layering pleural fluid in the fissures. No pneumothorax. No acute osseous abnormality. IMPRESSION: 1. Unchanged right mid and lower lung airspace opacity and adjacent pleural effusion. 2. Stable cardiomegaly. Electronically Signed   By: Titus Dubin M.D.   On: 04/16/2017 11:07     Sherene Sires, DO 04/20/2017, 6:45 AM PGY-1, [place] Intern pager: [phone], text pages welcome

## 2017-04-20 NOTE — Progress Notes (Signed)
Occupational Therapy Treatment Patient Details Name: Brittany [name] MRN: [id number] DOB: 04-23-1956 Today's Date: 04/20/2017    History of present illness Brittany [name] is a 61 y.o. female presenting with hypoglycemia, hypothermia and weakness . PMH is significant for frequent falls, chronic diastolic heart failure, persistent atrial fibrillation/flutter, CKD3, HTN, seizure, dementia, COPD, and CVA.   OT comments  Pt progressing towards OT goals this session. Focus was dressing in anticipation for discharge. Daughter present and able to assist at home (or another family member) as needed. Discharge recommendations appropriate, and encourage Pt to continue with [facility] program. Thank you for the opportunity to serve this patient.  Follow Up Recommendations  No OT follow up;Supervision/Assistance - 24 hour    Equipment Recommendations  None recommended by OT    Recommendations for Other Services      Precautions / Restrictions Precautions Precautions: Fall Restrictions Weight Bearing Restrictions: No       Mobility Bed Mobility               General bed mobility comments: Pt sitting OOB when OT entered the room  Transfers Overall transfer level: Needs assistance   Transfers: Sit to/from Stand Sit to Stand: Min assist         General transfer comment: cues for hand placement, assist to steady upon rising     Balance Overall balance assessment: Needs assistance Sitting-balance support: No upper extremity supported;Single extremity supported;Feet supported Sitting balance-Leahy Scale: Fair Sitting balance - Comments: sitting EOB   Standing balance support: No upper extremity supported;Single extremity supported;During functional activity Standing balance-Leahy Scale: Fair                             ADL either performed or assessed with clinical judgement   ADL Overall ADL's : Needs assistance/impaired                 Upper Body Dressing :  Sitting;Set up Upper Body Dressing Details (indicate cue type and reason): to don shirt and jacket Lower Body Dressing: Minimal assistance;Sit to/from stand;With caregiver independent assisting Lower Body Dressing Details (indicate cue type and reason): Pt able to don underwear, pants, socks and shoes             Functional mobility during ADLs: Minimal assistance       Vision   Vision Assessment?: No apparent visual deficits   Perception     Praxis      Cognition Arousal/Alertness: Awake/alert Behavior During Therapy: WFL for tasks assessed/performed Overall Cognitive Status: History of cognitive impairments - at baseline                                          Exercises     Shoulder Instructions       General Comments Pt's daughter present and is planning on assisting at home    Pertinent Vitals/ Pain       Pain Assessment: No/denies pain  Home Living                                          Prior Functioning/Environment              Frequency  Min 2X/week  Progress Toward Goals  OT Goals(current goals can now be found in the care plan section)  Progress towards OT goals: Progressing toward goals  Acute Rehab OT Goals Patient Stated Goal: get home.  able to walk better OT Goal Formulation: With patient Time For Goal Achievement: 05/03/17 Potential to Achieve Goals: Good  Plan Discharge plan remains appropriate;Frequency remains appropriate    Co-evaluation                 AM-PAC PT "6 Clicks" Daily Activity     Outcome Measure   Help from another person eating meals?: None Help from another person taking care of personal grooming?: A Little Help from another person toileting, which includes using toliet, bedpan, or urinal?: A Little Help from another person bathing (including washing, rinsing, drying)?: A Little Help from another person to put on and taking off regular upper body  clothing?: A Little Help from another person to put on and taking off regular lower body clothing?: A Little 6 Click Score: 19    End of Session Equipment Utilized During Treatment: Gait belt  OT Visit Diagnosis: Unsteadiness on feet (R26.81);Muscle weakness (generalized) (M62.81)   Activity Tolerance Patient tolerated treatment well   Patient Left in bed;with nursing/sitter in room;with family/visitor present (sitting EOB)   Nurse Communication Mobility status        Time: 0712-1975 OT Time Calculation (min): 21 min  Charges: OT General Charges $OT Visit: 1 Visit OT Treatments $Self Care/Home Management : 8-22 mins  Hulda Humphrey OTR/L [place] 04/20/2017, 4:01 PM

## 2017-04-20 NOTE — Care Management Important Message (Signed)
Important Message  Patient Details  Name: Brittany [name] MRN: [id number] Date of Birth: 10-22-1955   Medicare Important Message Given:  Yes    [name] 04/20/2017, 9:56 AM

## 2017-04-20 NOTE — Discharge Instructions (Signed)
There were a number of medication changes made in hospital that you will need to discuss with your [facility] care team to see if you want to keep these changes or go back to your prior meds:  Metoprolol was stopped by cardiology for concern of low blood pressure. Amiodorone was increased to 200 BID Seroquel was stopped memantine was decreased to 5mg  Creon was decreased to 1200 instead of 2400  She was started on levoquin for a possible lung infection, she has been prescribed 3 more pills and will need to take one on 10/6, 10/8 and 10/10  Home health PT has also been ordered to give you a little more help at home in terms of getting around safely.  Please discuss all these with your [facility] provider.

## 2017-04-21 LAB — CULTURE, BLOOD (ROUTINE X 2)
CULTURE: NO GROWTH
SPECIAL REQUESTS: ADEQUATE

## 2017-04-21 LAB — ANTI-SCLERODERMA ANTIBODY: SCLERODERMA (SCL-70) (ENA) ANTIBODY, IGG: 0.2 AI (ref 0.0–0.9)

## 2017-04-21 LAB — ANGIOTENSIN CONVERTING ENZYME: ANGIOTENSIN-CONVERTING ENZYME: 47 U/L (ref 14–82)

## 2017-04-22 LAB — CULTURE, BLOOD (ROUTINE X 2)
Culture: NO GROWTH
Special Requests: ADEQUATE

## 2017-04-22 LAB — MPO/PR-3 (ANCA) ANTIBODIES
ANCA Proteinase 3: <3.5 U/mL (ref 0.0–3.5)
Myeloperoxidase Abs: <9 U/mL (ref 0.0–9.0)

## 2017-04-23 ENCOUNTER — Encounter: Payer: Self-pay | Admitting: Family Medicine

## 2017-04-23 LAB — ANCA TITERS: C-ANCA: <1:20 {titer}

## 2017-04-23 NOTE — Progress Notes (Signed)
Resulted level of levetiractam is quite high. Patient was already d/c so I called [facility] and spoke with Brittany Athens NP and gave her reults. She swill discuss with their physicians and address, likely hold for a day pr two and then decrease to qd per her report.

## 2017-04-24 LAB — RHEUMATOID FACTOR: Rhuematoid fact SerPl-aCnc: <10 IU/mL (ref 0.0–13.9)

## 2017-04-24 LAB — ANTINUCLEAR ANTIBODIES, IFA: ANA Ab, IFA: NEGATIVE

## 2017-04-27 ENCOUNTER — Encounter: Payer: Self-pay | Admitting: *Deleted

## 2017-05-03 ENCOUNTER — Encounter (HOSPITAL_COMMUNITY): Payer: Self-pay | Admitting: Emergency Medicine

## 2017-05-03 ENCOUNTER — Emergency Department (HOSPITAL_COMMUNITY): Payer: Medicare (Managed Care)

## 2017-05-03 ENCOUNTER — Inpatient Hospital Stay (HOSPITAL_COMMUNITY)
Admission: EM | Admit: 2017-05-03 | Discharge: 2017-05-08 | DRG: 291 | Disposition: A | Discharge status: Skilled Nursing Facility | Payer: Medicare (Managed Care) | Attending: Family Medicine | Admitting: Family Medicine

## 2017-05-03 DIAGNOSIS — R627 Adult failure to thrive: Secondary | ICD-10-CM | POA: Diagnosis present

## 2017-05-03 DIAGNOSIS — Z9111 Patient's noncompliance with dietary regimen: Secondary | ICD-10-CM | POA: Diagnosis not present

## 2017-05-03 DIAGNOSIS — E1151 Type 2 diabetes mellitus with diabetic peripheral angiopathy without gangrene: Secondary | ICD-10-CM | POA: Diagnosis present

## 2017-05-03 DIAGNOSIS — I959 Hypotension, unspecified: Secondary | ICD-10-CM | POA: Diagnosis not present

## 2017-05-03 DIAGNOSIS — Z8673 Personal history of transient ischemic attack (TIA), and cerebral infarction without residual deficits: Secondary | ICD-10-CM | POA: Diagnosis not present

## 2017-05-03 DIAGNOSIS — I739 Peripheral vascular disease, unspecified: Secondary | ICD-10-CM | POA: Diagnosis present

## 2017-05-03 DIAGNOSIS — J440 Chronic obstructive pulmonary disease with acute lower respiratory infection: Secondary | ICD-10-CM | POA: Diagnosis present

## 2017-05-03 DIAGNOSIS — R0602 Shortness of breath: Secondary | ICD-10-CM

## 2017-05-03 DIAGNOSIS — Z823 Family history of stroke: Secondary | ICD-10-CM

## 2017-05-03 DIAGNOSIS — Z681 Body mass index (BMI) 19 or less, adult: Secondary | ICD-10-CM

## 2017-05-03 DIAGNOSIS — I5033 Acute on chronic diastolic (congestive) heart failure: Secondary | ICD-10-CM

## 2017-05-03 DIAGNOSIS — R296 Repeated falls: Secondary | ICD-10-CM | POA: Diagnosis present

## 2017-05-03 DIAGNOSIS — I483 Typical atrial flutter: Secondary | ICD-10-CM | POA: Diagnosis present

## 2017-05-03 DIAGNOSIS — K8689 Other specified diseases of pancreas: Secondary | ICD-10-CM | POA: Insufficient documentation

## 2017-05-03 DIAGNOSIS — R0600 Dyspnea, unspecified: Secondary | ICD-10-CM

## 2017-05-03 DIAGNOSIS — G40909 Epilepsy, unspecified, not intractable, without status epilepticus: Secondary | ICD-10-CM | POA: Diagnosis present

## 2017-05-03 DIAGNOSIS — J189 Pneumonia, unspecified organism: Secondary | ICD-10-CM | POA: Diagnosis present

## 2017-05-03 DIAGNOSIS — E43 Unspecified severe protein-calorie malnutrition: Secondary | ICD-10-CM | POA: Diagnosis present

## 2017-05-03 DIAGNOSIS — Z9181 History of falling: Secondary | ICD-10-CM

## 2017-05-03 DIAGNOSIS — Z9114 Patient's other noncompliance with medication regimen: Secondary | ICD-10-CM

## 2017-05-03 DIAGNOSIS — R748 Abnormal levels of other serum enzymes: Secondary | ICD-10-CM | POA: Diagnosis not present

## 2017-05-03 DIAGNOSIS — F0390 Unspecified dementia without behavioral disturbance: Secondary | ICD-10-CM | POA: Diagnosis present

## 2017-05-03 DIAGNOSIS — H409 Unspecified glaucoma: Secondary | ICD-10-CM | POA: Diagnosis present

## 2017-05-03 DIAGNOSIS — I482 Chronic atrial fibrillation, unspecified: Secondary | ICD-10-CM

## 2017-05-03 DIAGNOSIS — K861 Other chronic pancreatitis: Secondary | ICD-10-CM | POA: Diagnosis present

## 2017-05-03 DIAGNOSIS — I509 Heart failure, unspecified: Secondary | ICD-10-CM | POA: Diagnosis not present

## 2017-05-03 DIAGNOSIS — E441 Mild protein-calorie malnutrition: Secondary | ICD-10-CM | POA: Diagnosis present

## 2017-05-03 DIAGNOSIS — N183 Chronic kidney disease, stage 3 (moderate): Secondary | ICD-10-CM | POA: Diagnosis present

## 2017-05-03 DIAGNOSIS — I481 Persistent atrial fibrillation: Secondary | ICD-10-CM | POA: Diagnosis present

## 2017-05-03 DIAGNOSIS — I272 Pulmonary hypertension, unspecified: Secondary | ICD-10-CM | POA: Diagnosis present

## 2017-05-03 DIAGNOSIS — E1122 Type 2 diabetes mellitus with diabetic chronic kidney disease: Secondary | ICD-10-CM | POA: Diagnosis present

## 2017-05-03 DIAGNOSIS — Z7982 Long term (current) use of aspirin: Secondary | ICD-10-CM

## 2017-05-03 DIAGNOSIS — I130 Hypertensive heart and chronic kidney disease with heart failure and stage 1 through stage 4 chronic kidney disease, or unspecified chronic kidney disease: Principal | ICD-10-CM | POA: Diagnosis present

## 2017-05-03 DIAGNOSIS — Z87891 Personal history of nicotine dependence: Secondary | ICD-10-CM

## 2017-05-03 DIAGNOSIS — Z8249 Family history of ischemic heart disease and other diseases of the circulatory system: Secondary | ICD-10-CM

## 2017-05-03 DIAGNOSIS — I10 Essential (primary) hypertension: Secondary | ICD-10-CM | POA: Diagnosis not present

## 2017-05-03 LAB — CBC WITH DIFFERENTIAL/PLATELET
BASOS ABS: 0 10*3/uL (ref 0.0–0.1)
Basophils Relative: 0 %
Eosinophils Absolute: 0 10*3/uL (ref 0.0–0.7)
Eosinophils Relative: 0 %
HEMATOCRIT: 36.3 % (ref 36.0–46.0)
Hemoglobin: 11.9 g/dL — ABNORMAL LOW (ref 12.0–15.0)
LYMPHS PCT: 15 %
Lymphs Abs: 1.1 10*3/uL (ref 0.7–4.0)
MCH: 25.5 pg — ABNORMAL LOW (ref 26.0–34.0)
MCHC: 32.8 g/dL (ref 30.0–36.0)
MCV: 77.9 fL — AB (ref 78.0–100.0)
MONO ABS: 0.9 10*3/uL (ref 0.1–1.0)
Monocytes Relative: 12 %
NEUTROS ABS: 5.2 10*3/uL (ref 1.7–7.7)
Neutrophils Relative %: 73 %
PLATELETS: 178 10*3/uL (ref 150–400)
RBC: 4.66 MIL/uL (ref 3.87–5.11)
RDW: 18.9 % — ABNORMAL HIGH (ref 11.5–15.5)
WBC: 7.2 10*3/uL (ref 4.0–10.5)

## 2017-05-03 LAB — COMPREHENSIVE METABOLIC PANEL
ALT: 31 U/L (ref 14–54)
ANION GAP: 14 (ref 5–15)
AST: 55 U/L — ABNORMAL HIGH (ref 15–41)
Albumin: 3.3 g/dL — ABNORMAL LOW (ref 3.5–5.0)
Alkaline Phosphatase: 140 U/L — ABNORMAL HIGH (ref 38–126)
BUN: 12 mg/dL (ref 6–20)
CHLORIDE: 98 mmol/L — AB (ref 101–111)
CO2: 24 mmol/L (ref 22–32)
Calcium: 9.1 mg/dL (ref 8.9–10.3)
Creatinine, Ser: 1.18 mg/dL — ABNORMAL HIGH (ref 0.44–1.00)
GFR, EST AFRICAN AMERICAN: 57 mL/min — AB (ref >60)
GFR, EST NON AFRICAN AMERICAN: 49 mL/min — AB (ref >60)
Glucose, Bld: 85 mg/dL (ref 65–99)
POTASSIUM: 3.9 mmol/L (ref 3.5–5.1)
SODIUM: 136 mmol/L (ref 135–145)
Total Bilirubin: 1.2 mg/dL (ref 0.3–1.2)
Total Protein: 7.2 g/dL (ref 6.5–8.1)

## 2017-05-03 LAB — URINALYSIS, ROUTINE W REFLEX MICROSCOPIC
BILIRUBIN URINE: NEGATIVE
Glucose, UA: NEGATIVE mg/dL
Hgb urine dipstick: NEGATIVE
Ketones, ur: NEGATIVE mg/dL
Leukocytes, UA: NEGATIVE
NITRITE: NEGATIVE
Protein, ur: NEGATIVE mg/dL
SPECIFIC GRAVITY, URINE: 1.004 — AB (ref 1.005–1.030)
pH: 7 (ref 5.0–8.0)

## 2017-05-03 LAB — BRAIN NATRIURETIC PEPTIDE: B NATRIURETIC PEPTIDE 5: 1127 pg/mL — AB (ref 0.0–100.0)

## 2017-05-03 LAB — TROPONIN I: TROPONIN I: 0.04 ng/mL — AB (ref <0.03)

## 2017-05-03 MED ORDER — INSULIN ASPART 100 UNIT/ML ~~LOC~~ SOLN
0.0000 [IU] | Freq: Three times a day (TID) | SUBCUTANEOUS | Status: DC
  Administered 2017-05-08: 2 [IU] via SUBCUTANEOUS

## 2017-05-03 MED ORDER — LATANOPROST 0.005 % OP SOLN
1.0000 [drp] | Freq: Every day | OPHTHALMIC | Status: DC
  Administered 2017-05-03 – 2017-05-07 (×5): 1 [drp] via OPHTHALMIC
  Filled 2017-05-03 (×2): qty 2.5

## 2017-05-03 MED ORDER — ENSURE ENLIVE PO LIQD
237.0000 mL | Freq: Three times a day (TID) | ORAL | Status: DC
  Administered 2017-05-03 – 2017-05-08 (×13): 237 mL via ORAL

## 2017-05-03 MED ORDER — ONDANSETRON HCL 4 MG/2ML IJ SOLN
4.0000 mg | Freq: Four times a day (QID) | INTRAMUSCULAR | Status: DC | PRN
Start: 2017-05-03 — End: 2017-05-08

## 2017-05-03 MED ORDER — ACETAMINOPHEN 650 MG RE SUPP: 650.0000 mg | Freq: Four times a day (QID) | RECTAL | Status: DC | PRN

## 2017-05-03 MED ORDER — LAMOTRIGINE 100 MG PO TABS
100.0000 mg | ORAL_TABLET | Freq: Two times a day (BID) | ORAL | Status: DC
  Administered 2017-05-03 – 2017-05-08 (×10): 100 mg via ORAL
  Filled 2017-05-03 (×10): qty 1

## 2017-05-03 MED ORDER — ACETAMINOPHEN 325 MG PO TABS: 650.0000 mg | ORAL_TABLET | Freq: Four times a day (QID) | ORAL | Status: DC | PRN

## 2017-05-03 MED ORDER — FUROSEMIDE 10 MG/ML IJ SOLN
20.0000 mg | Freq: Once | INTRAMUSCULAR | Status: AC
  Administered 2017-05-03: 20 mg via INTRAVENOUS
  Filled 2017-05-03: qty 2

## 2017-05-03 MED ORDER — MEMANTINE HCL 10 MG PO TABS
5.0000 mg | ORAL_TABLET | Freq: Two times a day (BID) | ORAL | Status: DC
  Administered 2017-05-03 – 2017-05-08 (×10): 5 mg via ORAL
  Filled 2017-05-03 (×10): qty 1

## 2017-05-03 MED ORDER — DONEPEZIL HCL 10 MG PO TABS
10.0000 mg | ORAL_TABLET | Freq: Every day | ORAL | Status: DC
  Administered 2017-05-03 – 2017-05-07 (×5): 10 mg via ORAL
  Filled 2017-05-03 (×5): qty 1

## 2017-05-03 MED ORDER — PANCRELIPASE (LIP-PROT-AMYL) 12000-38000 UNITS PO CPEP
12000.0000 [IU] | ORAL_CAPSULE | Freq: Three times a day (TID) | ORAL | Status: DC
  Administered 2017-05-04 – 2017-05-08 (×14): 12000 [IU] via ORAL
  Filled 2017-05-03 (×15): qty 1

## 2017-05-03 MED ORDER — ONDANSETRON HCL 4 MG PO TABS: 4.0000 mg | ORAL_TABLET | Freq: Four times a day (QID) | ORAL | Status: DC | PRN

## 2017-05-03 MED ORDER — ASPIRIN EC 81 MG PO TBEC
81.0000 mg | DELAYED_RELEASE_TABLET | Freq: Every day | ORAL | Status: DC
  Administered 2017-05-04 – 2017-05-08 (×5): 81 mg via ORAL
  Filled 2017-05-03 (×5): qty 1

## 2017-05-03 MED ORDER — AMIODARONE HCL 200 MG PO TABS
200.0000 mg | ORAL_TABLET | Freq: Two times a day (BID) | ORAL | Status: DC
  Administered 2017-05-03 – 2017-05-08 (×10): 200 mg via ORAL
  Filled 2017-05-03 (×10): qty 1

## 2017-05-03 MED ORDER — ENOXAPARIN SODIUM 30 MG/0.3ML ~~LOC~~ SOLN
30.0000 mg | SUBCUTANEOUS | Status: DC
  Administered 2017-05-03 – 2017-05-07 (×5): 30 mg via SUBCUTANEOUS
  Filled 2017-05-03 (×5): qty 0.3

## 2017-05-03 MED ORDER — ALBUTEROL SULFATE (2.5 MG/3ML) 0.083% IN NEBU
5.0000 mg | INHALATION_SOLUTION | Freq: Once | RESPIRATORY_TRACT | Status: AC
  Administered 2017-05-03: 5 mg via RESPIRATORY_TRACT
  Filled 2017-05-03: qty 6

## 2017-05-03 NOTE — Consult Note (Signed)
Cardiology Consultation:   Patient ID: Brittany [name]; [id number]; 1956-05-25   Admit date: 05/03/2017 Date of Consult: 05/03/2017  Primary Care Provider: System, Pcp Not In Primary Cardiologist: Dr. Tamala Julian Primary Electrophysiologist:     Patient Profile:   Brittany [name] is a 61 y.o. female with a hx of afib/flutter RVR (not on [facility] for falls), diastolic heart failure, frequent falls, dementia, CVA, CKD stage III, COPD, seizure disorder, pancreatitis, pulmonary HTN, PAD, and alcoholism who is being seen today for the evaluation of Atrial fibrillation/flutter with RVR at the request of Dr. Vanita Panda.  History of Present Illness:   Brittany [name] is know to this service. She has had 5 hospitalizations this year for acute on chronic diastolic heart failure; hypotension and uncontrolled Afib in the setting of dehydration; and Afib RVR. She is not compliant on a low sodium diet. She is not on anticoagulation due to multiple recent falls. At baseline, she is on amiodarone and lopressor for her Afib. She has been noncompliant with her lasix.   Level 5 Caveat in the setting of dementia. No family at bedside.  She presented to [facility] with dyspnea and vomiting. She was recently seen, treated, and discharged to a SNF. She was initially well, but seemed to decline with the above symptoms. She was brought to ED for further management and was found to be in Afib/flutter. CXR with cardiomegaly, bilateral small effusions, and worsening dense bilateral perihilar areas of consolidation. CT chest pending. CT chest earlier this month with suspected PNA.   On my interview, she states she became short of breath and was brought to the ED. She has been compliant on her medication that is given to her at the SNF. She does not know if she has been taking a diuretic. She denies chest pain and palpitations.  Telemetry with Afib in the 80-100s, one bout of HR in the 120s. She is asymptomatic.   Past Medical History:    Diagnosis Date  . Atrial flutter ([place])   . COPD (chronic obstructive pulmonary disease) ([place])   . CVA (cerebral vascular accident) ([place])   . 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 Diastolic heart failure ([place])   . HTN (hypertension)   . PAD (peripheral artery disease) ([place])   . Pancreatitis   . Seizures ([place])     Past Surgical History:  Procedure Laterality Date  . CENTRAL LINE INSERTION  07/21/2016      . SHOULDER SURGERY Left      Home Medications:  Prior to Admission medications   Medication Sig Start Date End Date Taking? Authorizing Provider  acetaminophen (TYLENOL) 500 MG tablet Take 1,000 mg by mouth 2 (two) times daily as needed (for various aches and pains).    Yes [provider]  amiodarone (PACERONE) 200 MG tablet Take 1 tablet (200 mg total) by mouth 2 (two) times daily. 04/20/17 05/20/17 Yes Sherene Sires, DO  aspirin 81 MG EC tablet Take 1 tablet (81 mg total) by mouth daily. 04/21/17 05/21/17 Yes Sherene Sires, DO  Cholecalciferol (VITAMIN D3) 2000 units TABS Take 2,000 Units by mouth daily.   Yes [provider]  donepezil (ARICEPT) 10 MG tablet Take 10 mg by mouth at bedtime.   Yes [provider]  feeding supplement, ENSURE ENLIVE, (ENSURE ENLIVE) LIQD Take 237 mLs by mouth 3 (three) times daily between meals. 04/20/17  Yes Sherene Sires, DO  lamoTRIgine (LAMICTAL) 100 MG tablet Take 100 mg by mouth 2 (two) times daily.  Yes [provider]  latanoprost (XALATAN) 0.005 % ophthalmic solution Place 1 drop into both eyes at bedtime.   Yes [provider]  lipase/protease/amylase (CREON) 12000 units CPEP capsule Take 1 capsule (12,000 Units total) by mouth 3 (three) times daily before meals. 04/20/17 05/20/17 Yes Bland, Scott, DO  memantine (NAMENDA) 5 MG tablet Take 1 tablet (5 mg total) by mouth 2 (two) times daily. 04/20/17 05/20/17 Yes Bland, Scott, DO  levofloxacin (LEVAQUIN) 750 MG tablet Take 1 tablet (750 mg total) by mouth every  other day. 04/21/17   Sherene Sires, DO    Inpatient Medications: Scheduled Meds:  Continuous Infusions:  PRN Meds:   Allergies:   No Known Allergies  Social History:   Social History   Social History  . Marital status: Widowed    Spouse name: N/A  . Number of children: N/A  . Years of education: N/A   Occupational History  . Not on file.   Social History Main Topics  . Smoking status: Former Research scientist (life sciences)  . Smokeless tobacco: Never Used  . Alcohol use No     Comment: pt states last drink years ago  . Drug use: No  . Sexual activity: Not on file   Other Topics Concern  . Not on file   Social History Narrative  . No narrative on file    Family History:    Family History  Problem Relation Age of Onset  . Hypertension Father   . Cancer Mother   . Cancer Child   . Cerebrovascular Accident Child   . Gout Child      ROS:  Please see the history of present illness.  ROS  All other ROS reviewed and negative.     Physical Exam/Data:   Vitals:   05/03/17 0915 05/03/17 0930 05/03/17 0937 05/03/17 0952  BP:  (!) 126/106    Pulse:      Resp:  18  (!) 26  Temp:      TempSrc:      SpO2: 93%  (!) 89% 98%  Weight:      Height:       No intake or output data in the 24 hours ending 05/03/17 1512 Filed Weights   05/03/17 0825  Weight: 90 lb 14.4 oz (41.2 kg)   Body mass index is 15.85 kg/m.  General:  Frail, elderly female in no acute distress HEENT: normal Neck: no JVD Vascular: No carotid bruits Cardiac:  Irregular rhythm, regular rate, no murmurs Lungs:  Course breath sounds throughout, crackles throughout, respirations unlabored Abd: soft, nontender, no hepatomegaly  Ext:  1+ edema Musculoskeletal:  No deformities, BUE and BLE strength normal and equal Skin: warm and dry  Neuro:  CNs 2-12 intact, no focal abnormalities noted Psych:  Normal affect   EKG:  The EKG was personally reviewed and demonstrates: Atrial flutter, rate 121 Telemetry:  Telemetry  was personally reviewed and demonstrates:  Afib 90s  Relevant CV Studies:  Echo 01/22/17: Study Conclusions - Left ventricle: The cavity size was normal. Wall thickness was   increased in a pattern of mild LVH. Systolic function was normal.   The estimated ejection fraction was in the range of 50% to 55%.   There is hypokinesis of the apical myocardium. The study is not   technically sufficient to allow evaluation of LV diastolic   function. - Mitral valve: Calcified annulus. There was mild regurgitation. - Left atrium: The atrium was severely dilated. - Right atrium: The atrium was  severely dilated. - Pulmonary arteries: Systolic pressure was mildly to moderately   increased. PA peak pressure: 47 mm Hg (S).  Impressions: - Apical hypokinesis with overall low normal LV systolic function;   mild LVH; mild MR; severe biatrial enlargement; mild TR with mild   to moderate elevation in pulmonary pressure.  Laboratory Data:  Chemistry Recent Labs Lab 05/03/17 0945  NA 136  K 3.9  CL 98*  CO2 24  GLUCOSE 85  BUN 12  CREATININE 1.18*  CALCIUM 9.1  GFRNONAA 49*  GFRAA 57*  ANIONGAP 14     Recent Labs Lab 05/03/17 0945  PROT 7.2  ALBUMIN 3.3*  AST 55*  ALT 31  ALKPHOS 140*  BILITOT 1.2   Hematology Recent Labs Lab 05/03/17 0945  WBC 7.2  RBC 4.66  HGB 11.9*  HCT 36.3  MCV 77.9*  MCH 25.5*  MCHC 32.8  RDW 18.9*  PLT 178   Cardiac Enzymes Recent Labs Lab 05/03/17 0945  TROPONINI 0.04*   No results for input(s): TROPIPOC in the last 168 hours.  BNP Recent Labs Lab 05/03/17 0945  BNP 1,127.0*    DDimer No results for input(s): DDIMER in the last 168 hours.  Radiology/Studies:  Dg Chest Port 1 View  Result Date: 05/03/2017 CLINICAL DATA:  Shortness of Breath EXAM: PORTABLE CHEST 1 VIEW COMPARISON:  04/17/2017 FINDINGS: There is hyperinflation of the lungs compatible with COPD. There is cardiomegaly. Dense consolidation noted in the lungs  bilaterally, slightly worsened since prior study. Small effusions again noted. IMPRESSION: Worsening dense bilateral perihilar areas of consolidation. Stable small effusions. COPD, cardiomegaly. Electronically Signed   By: Rolm Baptise M.D.   On: 05/03/2017 10:01    Assessment and Plan:   1. Atrial fibrillation/flutter - she is generally rate controlled on home amiodarone - lopressor is being held for pressure while diuresing - last pressure was 126/106 - recommend starting 12.5 mg lopressor daily - this was not restarted at her last discharge on 04/20/17 - HR will likely improve as her pulmonary status improves and as she is adequately diuresed - no indication for emergent cardioversion at this time - previously deemed not an anticoagulation candidate given her recent frequent falls   2. Acute on chronic diastolic heart failure, shortness of breath - echo with normal LV function, not able to determine diastolic function - BNP at admission was 1127 - agree with 40 mg IV lasix daily - sCr 1.18 on admission, last discharge creatinine was 1.29 (04/20/17) - follow daily labs and replace K per daily BMP - no need for repeat echo at this time - she was instructed to stop lasix at her last discharge (04/20/17) - her SOB may be multifactorial given her history of diastolic heart failure not on a diuretic and elevated BNP, COPD, and likely PNA   For questions or updates, please contact [facility] Please consult [email or web address] for contact info under Cardiology/STEMI.   Signed, Tami Lin Marolyn Urschel, PA  05/03/2017 3:12 PM

## 2017-05-03 NOTE — ED Notes (Signed)
Purewick placed on pt. 

## 2017-05-03 NOTE — H&P (Signed)
[facility] Admission History and Physical Service Pager: [phone]  Patient name: Brittany [name] Medical record number: [id number] Date of birth: 1955-11-24 Age: 61 y.o. Gender: female  Primary Care Provider: System, Pcp Not In Consultants: cardio Code Status: full  Chief Complaint: shortness of breath  Assessment and Plan: Brittany Robinsonis a 61 y.o.femalepresenting with shortness of breath in the setting of recent volume overload concerning for acute CHF exacerbation. PMH is significant for frequent falls, chronic diastolic heart failure, persistent atrial fibrillation/flutter, CKD3, HTN, seizure, dementia, COPD, and CVA.  Dyspnea, acute, stable Patient was satting in mid 80s on RA at presentation to ED, with 2L [place] she improved to high 90s.  CXR showed "Worsening dense bilateral perihilar areas of consolidation. Stable small effusions."  Given crackles on exam and trace/1+ pitting edema in LE, shortness of breath is likely secondary to fluid overload which would be consistent with CHF exacerbation. BNP was 1127.  Also important to consider infectious component as patient was complaining of productive cough but is afebrile currently and WBC is wnl. Imaging did not show any infiltrates concerning for PNA.There was also a plan for repeat CT in 6 wks after prior admission to evaluate for potential lung mass so it's possible there might a malignant component to these pulmonary issues as well. PE would also be part of the differential in the setting of possible lung carcinoma. No unilateral leg swelling, or pleuritic chest pain, hemoptysis that would be more concerning. Wells score 0. Lastly shortness of breath could be secondary to cardiac process. Trop was 0.04 and EKG showed afib normal rate on admission. No chest pain reported. Denies any nausea, or diaphoresis. -Admit to FMTS, admitting attending Dr. Andria Frames -Place patient on telemetry -hold IV fluids -Oxygen  therapy as needed -daily weights -strict I/O -Give furosemide 20 mg IV once -Follow up on am CBC and BMP -Cardiology consult, appreciate recs -Consider repeat CT chest prior to discharge -will likely add Abx if she becomes febrile  -PT/OT -Acetaminophen 650 mg q6 prn -Zofran 4 mg prn  Diastolic CHF consistent problems with salt/fluid intake in the past. BNP 1127 (was 1800 on last admission). Last echo 7/18 showed 50-55%EF. Patient was on lasix prior to last admission but held after last admission due to low BP. -Give furosemide 20 mg IV once -will hold metoprolol out of concern for bp while diuresing -Daily weights -Monitor I's and O's  Acute on chronic kidney disease stage III, stable   still making urine, follows with urology. Patient is pre-dialysis. Creatinine 1.18 w/ baseline of 1.5-1.98 earlier this year. Improved still last admission. -strict I/O -Follow-up on a.m. BMP  Afib, tachy to 100s on admission Patient followed by cardiology. Patient is rate controlled with amiodarone and option to add metoprolol if needed and BP is stable. Patient is currently not on anticoagulation beyond aspirin 81 due to increased risk of bleeding with history of frequent falls.Patient has multiple afib treatments in their past medical record. -Consulted Cardiology, appreciate recs  -Amiodarone 200 mg BID home dose -Hold metoprolol due to risk of hypotension as we diurese  AMS/Dementia-at baseline from last admission,.. - home memantine to 5mg   - home donepezil 10  Pancreatitis, stable History of pancreatitis on Creon. Decrease dose during last admission due to loose stools. Patient with mild tenderness on palpation in the epigastric region. Reports one loose stool prior to admission. Will monitor given previous admission and antibiotics regimen. Would like to rule out C.diff if symptoms persists. -Continue  home Creon  Seizure disorder Appears to be well controlled on home  medication. No seizure activity reported in the setting of hypoglycemia. Will continue home regimen. --Lamictal 100 mg twice a day --Keppra 700 mg twice a day  Glaucoma -Continue latanoprost drops   Weight  Loss- 2lbs lost since last admission and 10lbs in the 2 months prior to that, had long discussion with family about their restrictions on salt in her food and her subsequent refusal to eat anything that doesn't have salt.  Could be malnutrition or possible malignancy. -will consult pulm to consider further workup for suscpision of malignancy from prior admission -instructed nursing to allow a few salt packets per meal if she is refusing the meal tray   FEN/GI: Heart healthy diet, pantoprazole,  Prophylaxis: lovenox/aspirin 81mg   Disposition: admit to tele,   History of Present Illness:  Brittany Robinsonis a 61 y.o.femalepresenting with SOB and vomiting (x2) for two days. Two days ago the patient reports developing a gradual onset of SOB with an associated productive cough with clear sputum. Since that time the patient states her SOB exacerbated, where she could only walk up a flight of stairs before becoming short of breath. She presented to the ED for further evaluation. Currently the patient is breathing well on 2L through [place]. She denies continued cough. She notes one episode of diarrhea yesterday without blood. Patient states she has chills, nausea, and generalized weakness. Patient knows no alleviating factors and reports ambulation exacerbates her breathing. She denies any pain.   Denies headache, chest pain (though states to check heart), abdominal pain, dysuria, cannot recall frequency of urination, denies hematuria, blurred vision or any other associated symptom or complaint. Of note, the patient lives with son at home and he helps with meds.  Of note--patient denies abdominal pain, but has pain to palpation of right upper quadrant.   Review Of Systems: Per HPI with the  following additions:   ROS  Patient Active Problem List   Diagnosis Date Noted  . Protein-calorie malnutrition, severe 04/18/2017  . Sepsis ([place])   . Persistent atrial fibrillation ([place])   . Low back pain   . Lung mass   . Dyspnea   . Hypoglycemia 04/16/2017  . CHF (congestive heart failure) ([place]) 01/22/2017  . Acute renal failure (ARF) ([place]) 01/22/2017  . Atrial fibrillation with RVR ([place]) 01/22/2017  . Acute renal failure with acute tubular necrosis superimposed on stage 3 chronic kidney disease ([place])   . Dementia 08/05/2016  . CKD (chronic kidney disease) stage 3, GFR 30-59 ml/min ([facility]) 08/05/2016  . Seizures ([place]) 08/05/2016  . Acute on chronic diastolic heart failure ([place]) 08/04/2016  . Malnutrition of moderate degree 07/26/2016  . Atrial flutter ([place])   . AKI (acute kidney injury) ([place])   . Cardiogenic shock ([place]) 07/21/2016  . Syncope and collapse   . Encounter for central line care   . Hypotension     Past Medical History: Past Medical History:  Diagnosis Date  . Atrial flutter ([place])   . COPD (chronic obstructive pulmonary disease) ([place])   . CVA (cerebral vascular accident) ([place])   . 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 Diastolic heart failure ([place])   . HTN (hypertension)   . PAD (peripheral artery disease) ([place])   . Pancreatitis   . Seizures ([place])     Past Surgical History: Past Surgical History:  Procedure Laterality Date  . CENTRAL LINE INSERTION  07/21/2016      . SHOULDER SURGERY  Left     Social History: Social History  Substance Use Topics  . Smoking status: Former Research scientist (life sciences)  . Smokeless tobacco: Never Used  . Alcohol use No     Comment: pt states last drink years ago   Additional social history: lives with son, followed by pace  Please also refer to relevant sections of EMR.  Family History: Family History  Problem Relation Age of Onset  . Hypertension Father   . Cancer Mother   . Cancer Child   . Cerebrovascular Accident Child   . Gout  Child    (If not completed, MUST add something in)  Allergies and Medications: No Known Allergies No current facility-administered medications on file prior to encounter.    Current Outpatient Prescriptions on File Prior to Encounter  Medication Sig Dispense Refill  . acetaminophen (TYLENOL) 500 MG tablet Take 1,000 mg by mouth 2 (two) times daily as needed (for various aches and pains).     Marland Kitchen amiodarone (PACERONE) 200 MG tablet Take 1 tablet (200 mg total) by mouth 2 (two) times daily. 60 tablet 0  . aspirin 81 MG EC tablet Take 1 tablet (81 mg total) by mouth daily. 30 tablet 0  . Cholecalciferol (VITAMIN D3) 2000 units TABS Take 2,000 Units by mouth daily.    Marland Kitchen donepezil (ARICEPT) 10 MG tablet Take 10 mg by mouth at bedtime.    . feeding supplement, ENSURE ENLIVE, (ENSURE ENLIVE) LIQD Take 237 mLs by mouth 3 (three) times daily between meals. 237 mL 12  . lamoTRIgine (LAMICTAL) 100 MG tablet Take 100 mg by mouth 2 (two) times daily.    Marland Kitchen latanoprost (XALATAN) 0.005 % ophthalmic solution Place 1 drop into both eyes at bedtime.    . lipase/protease/amylase (CREON) 12000 units CPEP capsule Take 1 capsule (12,000 Units total) by mouth 3 (three) times daily before meals. 90 capsule 0  . memantine (NAMENDA) 5 MG tablet Take 1 tablet (5 mg total) by mouth 2 (two) times daily. 60 tablet 0  . levofloxacin (LEVAQUIN) 750 MG tablet Take 1 tablet (750 mg total) by mouth every other day. 3 tablet 0    Objective: BP (!) 126/106   Pulse 89   Temp 97.8 F (36.6 C) (Oral)   Resp (!) 26   Ht 5' 3.5" (1.613 m)   Wt 90 lb 14.4 oz (41.2 kg)   SpO2 98%   BMI 15.85 kg/m  Exam: General: cachectic, alert but more fatigued in appearance than at prior discharge, appears older than age Eyes: clear sclera ENTM: no lesions/injuries noted Neck: ROM intact Cardiovascular: tachycardic low 100s, irregular rhythm, no murmus noted on ausculation Respiratory: Bilateral lower lung fields crackles , ausculation  difficult given poor respiratory effor tof patient.  Unsure whether this was due to inability/weakness or dementia related non-compliance Gastrointestinal: bowel sounds in all quadrants, RUQ/epigastic pain on palpation MSK: 5/5 strength in all limbs although at levels expected for a cachectic female, bilateral +1 pitting edema Derm: no lesions noted Neuro: no deficits noted grossly Psych: demented, able to have conversation by detail recall is severaly limited  Labs and Imaging: CBC BMET   Recent Labs Lab 05/03/17 0945  WBC 7.2  HGB 11.9*  HCT 36.3  PLT 178    Recent Labs Lab 05/03/17 0945  NA 136  K 3.9  CL 98*  CO2 24  BUN 12  CREATININE 1.18*  GLUCOSE 85  CALCIUM 9.1     Dg Chest Port 1 View  Result Date:  05/03/2017 CLINICAL DATA:  Shortness of Breath EXAM: PORTABLE CHEST 1 VIEW COMPARISON:  04/17/2017 FINDINGS: There is hyperinflation of the lungs compatible with COPD. There is cardiomegaly. Dense consolidation noted in the lungs bilaterally, slightly worsened since prior study. Small effusions again noted. IMPRESSION: Worsening dense bilateral perihilar areas of consolidation. Stable small effusions. COPD, cardiomegaly. Electronically Signed   By: Rolm Baptise M.D.   On: 05/03/2017 10:01   I have seen and evaluated the patient with Dr. Criss Rosales. I am in agreement with the note above in its revised form. My additions are in blue.  Brittany Skiff, MD Family Medicine, PGY-2   Brittany Sires, DO 05/03/2017, 12:31 PM PGY-1, [place] [facility] pager: [phone], text pages welcome

## 2017-05-03 NOTE — Progress Notes (Signed)
Patient refuses to get capillary blood sugar taken. Says she isn't diabetic and doesn't want to be stuck.

## 2017-05-03 NOTE — ED Notes (Signed)
Admitting providers at bedside

## 2017-05-03 NOTE — Progress Notes (Signed)
Patient was coughing and coughed up blood tinged sputum upon arrival to the unit. That has now stopped.

## 2017-05-03 NOTE — ED Notes (Signed)
Pt given 2 warm blankets and ice chips

## 2017-05-03 NOTE — Progress Notes (Signed)
Paged MD to confirm cardiac monitoring order.

## 2017-05-03 NOTE — ED Provider Notes (Signed)
[facility] EMERGENCY DEPARTMENT Provider Note   CSN: [id number] Arrival date & time: 05/03/17  0800     History   Chief Complaint Chief Complaint  Patient presents with  . Shortness of Breath    HPI Brittany [name] is a 61 y.o. female.  HPI  Patient presents with her son who assists with the history of present illness. Patient has dementia, but seems to answer direct sustained questions with brief appropriate responses. Level V caveat secondary to dementia. However, the patient's son seems to be a reliable historian. He notes that since recent hospitalization and discharge to a rehabilitation facility the patient was transiently well, but then in the hours prior to ED arrival had visible dyspnea, and vomited several times. She was able to take today's home medication. Patient herself denies current nausea, pain. Son notes that the patient had multiple medications changed during her recent hospitalization, but is unsure of what the changes were.    Past Medical History:  Diagnosis Date  . Atrial flutter ([place])   . COPD (chronic obstructive pulmonary disease) ([place])   . CVA (cerebral vascular accident) ([place])   . 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 Diastolic heart failure ([place])   . HTN (hypertension)   . PAD (peripheral artery disease) ([place])   . Pancreatitis   . Seizures [facility])     Patient Active Problem List   Diagnosis Date Noted  . Protein-calorie malnutrition, severe 04/18/2017  . Sepsis ([place])   . Persistent atrial fibrillation ([place])   . Low back pain   . Lung mass   . Dyspnea   . Hypoglycemia 04/16/2017  . CHF (congestive heart failure) ([place]) 01/22/2017  . Acute renal failure (ARF) ([place]) 01/22/2017  . Atrial fibrillation with RVR ([place]) 01/22/2017  . Acute renal failure with acute tubular necrosis superimposed on stage 3 chronic kidney disease ([place])   . Dementia 08/05/2016  . CKD (chronic kidney disease) stage 3, GFR 30-59 ml/min (HCC)  08/05/2016  . Seizures ([place]) 08/05/2016  . Acute on chronic diastolic heart failure ([place]) 08/04/2016  . Malnutrition of moderate degree 07/26/2016  . Atrial flutter ([place])   . AKI (acute kidney injury) ([place])   . Cardiogenic shock ([place]) 07/21/2016  . Syncope and collapse   . Encounter for central line care   . Hypotension     Past Surgical History:  Procedure Laterality Date  . CENTRAL LINE INSERTION  07/21/2016      . SHOULDER SURGERY Left     OB History    No data available       Home Medications    Prior to Admission medications   Medication Sig Start Date End Date Taking? Authorizing Provider  acetaminophen (TYLENOL) 500 MG tablet Take 1,000 mg by mouth 2 (two) times daily as needed (for various aches and pains).     [provider]  amiodarone (PACERONE) 200 MG tablet Take 1 tablet (200 mg total) by mouth 2 (two) times daily. 04/20/17 05/20/17  Sherene Sires, DO  aspirin 81 MG EC tablet Take 1 tablet (81 mg total) by mouth daily. 04/21/17 05/21/17  Sherene Sires, DO  Cholecalciferol (VITAMIN D3) 2000 units TABS Take 2,000 Units by mouth daily.    [provider]  donepezil (ARICEPT) 10 MG tablet Take 10 mg by mouth at bedtime.    [provider]  feeding supplement, ENSURE ENLIVE, (ENSURE ENLIVE) LIQD Take 237 mLs by mouth 3 (three) times daily between meals. 04/20/17  Sherene Sires, DO  lamoTRIgine (LAMICTAL) 100 MG tablet Take 100 mg by mouth 2 (two) times daily.    [provider]  latanoprost (XALATAN) 0.005 % ophthalmic solution Place 1 drop into both eyes at bedtime.    [provider]  levETIRAcetam (KEPPRA) 750 MG tablet Take 750 mg by mouth 2 (two) times daily.    [provider]  levofloxacin (LEVAQUIN) 750 MG tablet Take 1 tablet (750 mg total) by mouth every other day. 04/21/17   Sherene Sires, DO  lipase/protease/amylase (CREON) 12000 units CPEP capsule Take 1 capsule (12,000 Units total) by mouth 3 (three) times daily  before meals. 04/20/17 05/20/17  Sherene Sires, DO  memantine (NAMENDA) 5 MG tablet Take 1 tablet (5 mg total) by mouth 2 (two) times daily. 04/20/17 05/20/17  Sherene Sires, DO    Family History Family History  Problem Relation Age of Onset  . Hypertension Father   . Cancer Mother   . Cancer Child   . Cerebrovascular Accident Child   . Gout Child     Social History Social History  Substance Use Topics  . Smoking status: Former Research scientist (life sciences)  . Smokeless tobacco: Never Used  . Alcohol use No     Comment: pt states last drink years ago     Allergies   Patient has no known allergies.   Review of Systems Review of Systems  Unable to perform ROS: Dementia     Physical Exam Updated Vital Signs BP (!) 126/106   Pulse 89   Temp 97.8 F (36.6 C) (Oral)   Resp (!) 26   Ht 5' 3.5" (1.613 m)   Wt 41.2 kg (90 lb 14.4 oz)   SpO2 98%   BMI 15.85 kg/m   Physical Exam  Constitutional: She has a sickly appearance. No distress.  HENT:  Head: Normocephalic and atraumatic.  Eyes: Conjunctivae and EOM are normal.  Cardiovascular: Normal rate and regular rhythm.   Pulmonary/Chest: Effort normal and breath sounds normal. No stridor. No respiratory distress.  Abdominal: She exhibits no distension.  Musculoskeletal: She exhibits no edema.  Neurological: She is alert. She displays atrophy. She displays no tremor. No cranial nerve deficit. She exhibits normal muscle tone. She displays no seizure activity.  Skin: Skin is warm and dry.  Psychiatric: She is withdrawn. Cognition and memory are impaired.  Nursing note and vitals reviewed.    ED Treatments / Results  Labs (all labs ordered are listed, but only abnormal results are displayed) Labs Reviewed  COMPREHENSIVE METABOLIC PANEL  BRAIN NATRIURETIC PEPTIDE  TROPONIN I  CBC WITH DIFFERENTIAL/PLATELET  URINALYSIS, ROUTINE W REFLEX MICROSCOPIC    EKG  EKG Interpretation  Date/Time:  Thursday May 03 2017 08:11:45 EDT Ventricular  Rate:  121 PR Interval:    QRS Duration: 96 QT Interval:  304 QTC Calculation: 431 R Axis:   -52 Text Interpretation:  Atrial flutter with variable A-V block Incomplete right bundle branch block Left anterior fascicular block ST-t wave abnormality Abnormal ekg Confirmed by Carmin Muskrat [phone]) on 05/03/2017 9:08:42 AM       Radiology Dg Chest Port 1 View  Result Date: 05/03/2017 CLINICAL DATA:  Shortness of Breath EXAM: PORTABLE CHEST 1 VIEW COMPARISON:  04/17/2017 FINDINGS: There is hyperinflation of the lungs compatible with COPD. There is cardiomegaly. Dense consolidation noted in the lungs bilaterally, slightly worsened since prior study. Small effusions again noted. IMPRESSION: Worsening dense bilateral perihilar areas of consolidation. Stable small effusions. COPD, cardiomegaly. Electronically Signed  By: Rolm Baptise M.D.   On: 05/03/2017 10:01    Procedures Procedures (including critical care time)  Medications Ordered in ED Medications  albuterol (PROVENTIL) (2.5 MG/3ML) 0.083% nebulizer solution 5 mg (5 mg Nebulization Given 05/03/17 0936)     Initial Impression / Assessment and Plan / ED Course  I have reviewed the triage vital signs and the nursing notes.  Pertinent labs & imaging results that were available during my care of the patient were reviewed by me and considered in my medical decision making (see chart for details).  EMR reviewed, most notable for recent admission, including cardiology and pulmonology eval and changes to meds, including cessation of B-Blocker 2/2 hypotension.  CT from that time as below IMPRESSION: 1. Cardiomegaly with biatrial enlargement. Enlarged pulmonary arteries, suggesting pulmonary hypertension 2. Moderate bilateral ground-glass densities and scattered areas of consolidation which could reflect multifocal pneumonia or unusual appearance of pulmonary edema. Underlying mild emphysema. 3. Moderate right pleural effusion with  loculation along the pulmonary fissure. 4. Mild mediastinal adenopathy, possibly reactive Aortic Atherosclerosis (ICD10-I70.0) and Emphysema (ICD10-J43.9). Electronically Signed By: Donavan Foil M.D. On:     On cardiac monitor the patient has atrial fibrillation, rate 90s. This oximetry is 98% with supplemental oxygen. Abnormal.   11:54 AM Patient in similar condition, awake and alert, though continues to require supplemental oxygen. Heart rate variable 90/120, A. Fib. With concern for possible indication effects, as well as evidence for worsening pulmonary condition, and new oxygen requirement patient will require admission for further evaluation and management. Absent fever, leukocytosis, cough, empiric antibiotics not started, pending further evaluation. Final Clinical Impressions(s) / ED Diagnoses  dyspnea   Carmin Muskrat, MD 05/03/17 1155

## 2017-05-03 NOTE — ED Notes (Signed)
Trop 0.04 reported to Dr. Vanita Panda

## 2017-05-03 NOTE — ED Triage Notes (Signed)
Pt reports SOB with weakness beginning early this morning.  Pt reports recent hospitalization for COPD flare, dc'ed from [facility] on Tuesday. Resp e/u at this time, NAD noted.

## 2017-05-04 ENCOUNTER — Inpatient Hospital Stay (HOSPITAL_COMMUNITY): Payer: Medicare (Managed Care)

## 2017-05-04 DIAGNOSIS — I10 Essential (primary) hypertension: Secondary | ICD-10-CM

## 2017-05-04 DIAGNOSIS — R748 Abnormal levels of other serum enzymes: Secondary | ICD-10-CM

## 2017-05-04 DIAGNOSIS — I272 Pulmonary hypertension, unspecified: Secondary | ICD-10-CM

## 2017-05-04 LAB — GLUCOSE, CAPILLARY
GLUCOSE-CAPILLARY: 75 mg/dL (ref 65–99)
GLUCOSE-CAPILLARY: 103 mg/dL — AB (ref 65–99)
Glucose-Capillary: 105 mg/dL — ABNORMAL HIGH (ref 65–99)

## 2017-05-04 LAB — CBC
HEMATOCRIT: 32.3 % — AB (ref 36.0–46.0)
Hemoglobin: 10.5 g/dL — ABNORMAL LOW (ref 12.0–15.0)
MCH: 25 pg — ABNORMAL LOW (ref 26.0–34.0)
MCHC: 32.5 g/dL (ref 30.0–36.0)
MCV: 76.9 fL — ABNORMAL LOW (ref 78.0–100.0)
PLATELETS: 183 10*3/uL (ref 150–400)
RBC: 4.2 MIL/uL (ref 3.87–5.11)
RDW: 18.4 % — AB (ref 11.5–15.5)
WBC: 7.9 10*3/uL (ref 4.0–10.5)

## 2017-05-04 LAB — BASIC METABOLIC PANEL
Anion gap: 10 (ref 5–15)
BUN: 12 mg/dL (ref 6–20)
CALCIUM: 8.5 mg/dL — AB (ref 8.9–10.3)
CO2: 27 mmol/L (ref 22–32)
CREATININE: 1.01 mg/dL — AB (ref 0.44–1.00)
Chloride: 95 mmol/L — ABNORMAL LOW (ref 101–111)
GFR calc Af Amer: >60 mL/min (ref >60)
GFR, EST NON AFRICAN AMERICAN: 60 mL/min — AB (ref >60)
GLUCOSE: 87 mg/dL (ref 65–99)
POTASSIUM: 4.6 mmol/L (ref 3.5–5.1)
SODIUM: 132 mmol/L — AB (ref 135–145)

## 2017-05-04 LAB — PROCALCITONIN: PROCALCITONIN: 0.16 ng/mL

## 2017-05-04 MED ORDER — METOPROLOL TARTRATE 12.5 MG HALF TABLET
12.5000 mg | ORAL_TABLET | Freq: Two times a day (BID) | ORAL | Status: DC
  Administered 2017-05-04: 12.5 mg via ORAL
  Filled 2017-05-04: qty 1

## 2017-05-04 MED ORDER — FUROSEMIDE 10 MG/ML IJ SOLN
40.0000 mg | Freq: Every day | INTRAMUSCULAR | Status: DC
  Administered 2017-05-04: 40 mg via INTRAVENOUS
  Filled 2017-05-04: qty 4

## 2017-05-04 MED ORDER — PIPERACILLIN-TAZOBACTAM 3.375 G IVPB 30 MIN
3.3750 g | Freq: Once | INTRAVENOUS | Status: AC
  Administered 2017-05-04: 3.375 g via INTRAVENOUS
  Filled 2017-05-04: qty 50

## 2017-05-04 MED ORDER — IOPAMIDOL (ISOVUE-300) INJECTION 61%
INTRAVENOUS | Status: AC
  Administered 2017-05-04: 75 mL via INTRAVENOUS
  Filled 2017-05-04: qty 75

## 2017-05-04 MED ORDER — FUROSEMIDE 10 MG/ML IJ SOLN: 20.0000 mg | Freq: Every day | INTRAMUSCULAR | Status: DC

## 2017-05-04 MED ORDER — METOPROLOL TARTRATE 25 MG PO TABS
25.0000 mg | ORAL_TABLET | Freq: Two times a day (BID) | ORAL | Status: DC
  Administered 2017-05-04: 25 mg via ORAL
  Filled 2017-05-04 (×2): qty 1

## 2017-05-04 MED ORDER — FUROSEMIDE 10 MG/ML IJ SOLN
40.0000 mg | Freq: Two times a day (BID) | INTRAMUSCULAR | Status: DC
  Administered 2017-05-04: 40 mg via INTRAVENOUS
  Filled 2017-05-04 (×2): qty 4

## 2017-05-04 MED ORDER — DOXYCYCLINE HYCLATE 100 MG PO TABS
100.0000 mg | ORAL_TABLET | Freq: Two times a day (BID) | ORAL | Status: DC
  Administered 2017-05-04 (×2): 100 mg via ORAL
  Filled 2017-05-04 (×3): qty 1

## 2017-05-04 MED ORDER — LEVOFLOXACIN IN D5W 750 MG/150ML IV SOLN
750.0000 mg | INTRAVENOUS | Status: DC
  Administered 2017-05-04: 750 mg via INTRAVENOUS
  Filled 2017-05-04: qty 150

## 2017-05-04 MED ORDER — PIPERACILLIN-TAZOBACTAM 3.375 G IVPB
3.3750 g | Freq: Three times a day (TID) | INTRAVENOUS | Status: DC
  Administered 2017-05-04 – 2017-05-05 (×2): 3.375 g via INTRAVENOUS
  Filled 2017-05-04 (×3): qty 50

## 2017-05-04 NOTE — Clinical Social Work Note (Signed)
Clinical Social Work Assessment  Patient Details  Name: Brittany [name] MRN: [id number] Date of Birth: Nov 18, 1955  Date of referral:  05/04/17               Reason for consult:  Facility Placement, Discharge Planning                Permission sought to share information with:    Permission granted to share information::  No  Name::        Agency::     Relationship::     Contact Information:     Housing/Transportation Living arrangements for the past 2 months:  Single Family Home Source of Information:  Patient, Medical Team, Other (Comment Required) ([facility] CSW) Patient Interpreter Needed:  None Criminal Activity/Legal Involvement Pertinent to Current Situation/Hospitalization:  No - Comment as needed Significant Relationships:  Adult Children Lives with:  Relatives Do you feel safe going back to the place where you live?  Yes Need for family participation in patient care:  Yes (Comment)  Care giving concerns:  PT recommending SNF once medically stable for discharge.   Social Worker assessment / plan:  CSW spoke with [facility] CSW. They are agreeable to SNF placement and stated patient and family would likely choose [facility]. Received report from RN that patient is refusing SNF and plans to return home with her son. She told the RN that her daughter is NOT her legal guardian. CSW met with patient to confirm her SNF refusal. Patient confirmed and stated that she will return home with her son at discharge and he is aware of this. No further concerns. CSW signing off as social work intervention is no longer needed.  Employment status:  Unemployed Forensic scientist:  Other (Comment Required) ([facility]) PT Recommendations:  [place] / Referral to community resources:  [place]  Patient/Family's Response to care:  Patient refusing SNF placement. Patient's son supportive and involved in patient's care.  Patient/Family's Understanding of and  Emotional Response to Diagnosis, Current Treatment, and Prognosis:  Patient has a good understanding of the reason for admission but is refusing SNF placement. Patient appears pleased with hospital care.  Emotional Assessment Appearance:  Appears stated age Attitude/Demeanor/Rapport:  Guarded Affect (typically observed):  Unable to Assess Orientation:  Oriented to Self, Oriented to Place, Oriented to  Time, Oriented to Situation Alcohol / Substance use:  Never Used Psych involvement (Current and /or in the community):  No (Comment)  Discharge Needs  Concerns to be addressed:  Care Coordination Readmission within the last 30 days:  Yes Current discharge risk:  Dependent with Mobility Barriers to Discharge:  Continued Medical Work up   Candie Chroman, LCSW 05/04/2017, 5:08 PM

## 2017-05-04 NOTE — Clinical Social Work Note (Signed)
CSW left voicemail for [facility] social worker.  Dayton Scrape, [place]

## 2017-05-04 NOTE — Progress Notes (Signed)
Patient's daughter stated this afternoon that plan is for her mother to be discharged to a facility and she only wants "[place] or [place]."  Patient stated to me now that she is not going to a facility and plans on living with her son and grandchildren.  Clarise Cruz, SW, informed and will follow up with patient.

## 2017-05-04 NOTE — Progress Notes (Signed)
Pharmacy Antibiotic Note  Brittany [name] is a 61 y.o. female with concern of PNA. Pharmacy has been consulted for zosyn dosing. -WBC= 1.9, afebrile -CrCL ~ 40  Plan: -Zosyn 3.375gm IV q8h -No adjustments anticipated Will sign off. Please contact pharmacy with any other needs.  Thank you    Height: 5\' 3"  (160 cm) Weight: 92 lb 12.8 oz (42.1 kg) (scale c) IBW/kg (Calculated) : 52.4  Temp (24hrs), Avg:98.3 F (36.8 C), Min:97.5 F (36.4 C), Max:98.9 F (37.2 C)   Recent Labs Lab 05/03/17 0945 05/04/17 0609  WBC 7.2 7.9  CREATININE 1.18* 1.01*    Estimated Creatinine Clearance: 39.9 mL/min (A) (by C-G formula based on SCr of 1.01 mg/dL (H)).    No Known Allergies  Antimicrobials this admission: 10/19 zosyn>>  Dose adjustments this admission:   Microbiology results: none  Thank you for allowing pharmacy to be a part of this patient's care.  Hildred Laser, Pharm D 05/04/2017 11:39 AM

## 2017-05-04 NOTE — Discharge Summary (Signed)
[facility] Discharge Summary  Patient name: Brittany [name] Medical record number: [id number] Date of birth: Apr 04, 1956 Age: 61 y.o. Gender: female Date of Admission: 05/03/2017  Date of Discharge: 05/08/2017 Admitting Physician: Brittany Resides, MD  Primary Care Provider: System, Pcp Not In Consultants: Cardiology   Indication for Hospitalization: Dypnea   Discharge Diagnoses/Problem List:  Dyspnea Diastolic CHF  Acute on chronic kidney disease stage III Chronic Afib AMS/Dementia Chronic pancreatitis T2DM Seizure disorder Glaucoma Protein-calorie malnutrition   Disposition: SNF  Discharge Condition: Stable  Discharge Exam:  General: awake and alert, NAD, laying in bed  Cardiovascular: RRR, no MRG Respiratory: slight crackles in bases bilaterally, no wheezes, no increased work of breathing, no retractions  Abdomen: soft, non tender, non distended, bowel sounds normal  Extremities: no edema, non tender   Brief Hospital Course:  Brittany [name] is a 61 y.o. female presenting with SOB and vomiting for 2 days prior to admission. Patient had gradual onset of SOB with associated productive cough and clear sputum production. Patient was placed on supplemental O2. CXR showed worsening dense bilateral perihilar areas of consolidation. BNP on admission elevated to 1127. Last echo 7/18 showed 50-55%EF. Patient has a history of diastolic CHF and was given lasix on admission. CT chest on 10/20 showing evidence of pneumonia. Levaquin was started for a course of 5 days. Dyspnea improved prior to discharge and patient was back on room air and saturating in high 90s-100. O2 when ambulating showed O2 sat of 89% on room air but 99% on 1L O2.   During admission patient was tachycardic. PMHx is significant for A fib. Patient was rate controlled with amiodarone and metoprolol, which was held later due to low blood pressure readings. Due to low BP readings small 250 cc  boluses were used to bring BP up.   Issues for Follow Up:  1. Repeat CT for possible lung mass  2. Levaquin course for total of 5 days 3. Weight loss  4. Held metoprolol given low BP, patient was rate controlled while inpatient  5. Outpatient follow up with pulmonary  6. Will need supplemental O2   Significant Procedures: none  Significant Labs and Imaging:   Recent Labs Lab 05/04/17 0609 05/05/17 0427 05/06/17 0702  WBC 7.9 6.3 4.0  HGB 10.5* 11.5* 10.5*  HCT 32.3* 35.4* 32.8*  PLT 183 182 224    Recent Labs Lab 05/03/17 0945 05/04/17 0609 05/05/17 0427 05/06/17 0702 05/08/17 0520  NA 136 132* 132* 133* 133*  K 3.9 4.6 3.9 4.3 4.6  CL 98* 95* 87* 92* 96*  CO2 24 27 33* 32 26  GLUCOSE 85 87 78 79 75  BUN 12 12 19  22* 19  CREATININE 1.18* 1.01* 1.31* 1.37* 1.22*  CALCIUM 9.1 8.5* 8.9 8.8* 9.0  ALKPHOS 140*  --   --   --   --   AST 55*  --   --   --   --   ALT 31  --   --   --   --   ALBUMIN 3.3*  --   --   --   --     Dg Lumbar Spine 2-3 Views  Result Date: 04/16/2017 CLINICAL DATA:  Low back pain EXAM: LUMBAR SPINE - 2-3 VIEW COMPARISON:  CT 711 1,018 FINDINGS: Minimal scoliosis. Coarse right upper quadrant calcification may correspond to a liver calcification. Lumbar alignment within normal limits. Chronic superior endplate deformity at L2. Degenerative changes at L5-S1. Remaining vertebral body  heights are normal. Atherosclerotic calcifications of the aorta. Calcifications anterior to the spine, likely correspond to the pancreatic calcifications noted on the comparison CT. IMPRESSION: 1. Chronic superior endplate deformity at L2. Moderate degenerative changes at L5-S1 2. No acute osseous abnormality Electronically Signed   By: Donavan Foil M.D.   On: 04/16/2017 17:14   Ct Chest Wo Contrast  Result Date: 04/17/2017 CLINICAL DATA:  Hypoglycemia, weakness atrial fibrillation EXAM: CT CHEST WITHOUT CONTRAST TECHNIQUE: Multidetector CT imaging of the chest was  performed following the standard protocol without IV contrast. COMPARISON:  Radiograph 04/17/2017,, 02/22/2017, 07/21/2016 CT abdomen pelvis 01/24/2017 FINDINGS: Cardiovascular: Limited evaluation without intravenous contrast. Nonaneurysmal aorta. Mild atherosclerotic calcifications. Enlarged pulmonary trunk, measuring 3.8 cm with enlarged appearing right and left main pulmonary arteries. Cardiomegaly with biatrial enlargement. No significant pericardial effusion Mediastinum/Nodes: Mediastinal lymph nodes, slightly enlarged, measuring up to 9 mm in the precarinal space. Midline trachea. No thyroid mass. Esophagus unremarkable Lungs/Pleura: Small to moderate right pleural effusion with loculated fluid along the right pulmonary fissure. Multifocal ground-glass densities and mild consolidations within the bilateral lower lobes and bilateral upper lobes with relative sparing of the right middle lobe. Mild underlying emphysema. Negative for a pneumothorax. Upper Abdomen: Marked atrophy of the pancreas. No acute abnormality. Musculoskeletal: No chest wall mass or suspicious bone lesions identified. IMPRESSION: 1. Cardiomegaly with biatrial enlargement. Enlarged pulmonary arteries, suggesting pulmonary hypertension 2. Moderate bilateral ground-glass densities and scattered areas of consolidation which could reflect multifocal pneumonia or unusual appearance of pulmonary edema. Underlying mild emphysema. 3. Moderate right pleural effusion with loculation along the pulmonary fissure. 4. Mild mediastinal adenopathy, possibly reactive Aortic Atherosclerosis (ICD10-I70.0) and Emphysema (ICD10-J43.9). Electronically Signed   By: Donavan Foil M.D.   On: 04/17/2017 23:18   Ct Chest W Contrast  Result Date: 05/04/2017 CLINICAL DATA:  Shortness of breath EXAM: CT CHEST WITH CONTRAST TECHNIQUE: Multidetector CT imaging of the chest was performed during intravenous contrast administration. CONTRAST:  30mL ISOVUE-300 IOPAMIDOL  (ISOVUE-300) INJECTION 61% COMPARISON:  Radiograph 05/03/2017, CT 04/17/2017, 07/21/2016 FINDINGS: Cardiovascular: Aortic atherosclerosis. No aneurysmal dilatation. Enlarged pulmonary arterial trunk, measuring up to 3.7 cm with enlarged right and left main pulmonary artery branches. Cardiomegaly with biatrial enlargement and reflux of contrast into the hepatic veins consistent with elevated right heart pressure. No significant pericardial effusion. Mediastinum/Nodes: Midline trachea. No thyroid mass. Re- demonstrated mediastinal adenopathy, for example a 9 mm lymph node anterior to the trachea. No change. Esophagus within normal limits. Lungs/Pleura: Small to moderate right pleural effusion re- demonstrated with loculated fluid along the right pulmonary fissure, slightly decreased compared to the prior CT. Similar pattern of multifocal bilateral ground-glass densities within the upper lobes and left lower lobe. Increased ground-glass density and consolidation in the right middle lobe. More confluent consolidations now visible in the left lower lobe and left greater than right upper lobes. Upper Abdomen: No acute abnormality is visible in the upper abdomen. Atrophic pancreas with scattered calcifications. Musculoskeletal: No acute or suspicious IMPRESSION: 1. Stable to slight decreased right pleural effusion. Re- demonstrated multifocal ground-glass densities within the bilateral lungs with new or increased ground-glass density and mild consolidation in the right middle lobe and right upper lobe. More confluent consolidations now visible within both upper lobes and the left lower lobes. Findings could reflect combination of multifocal pneumonia and associated mild pulmonary edema. 2. Stable cardiomegaly with biatrial enlargement. Reflux of contrast into the hepatic veins compatible with elevated right heart pressure. Enlarged pulmonary artery consistent with pulmonary hypertension. 3.  Stable mild mediastinal  adenopathy. Electronically Signed   By: Donavan Foil M.D.   On: 05/04/2017 22:17   Dg Chest Port 1 View  Result Date: 05/03/2017 CLINICAL DATA:  Shortness of Breath EXAM: PORTABLE CHEST 1 VIEW COMPARISON:  04/17/2017 FINDINGS: There is hyperinflation of the lungs compatible with COPD. There is cardiomegaly. Dense consolidation noted in the lungs bilaterally, slightly worsened since prior study. Small effusions again noted. IMPRESSION: Worsening dense bilateral perihilar areas of consolidation. Stable small effusions. COPD, cardiomegaly. Electronically Signed   By: Rolm Baptise M.D.   On: 05/03/2017 10:01   Portable Chest 1 View  Result Date: 04/17/2017 CLINICAL DATA:  Dyspnea EXAM: PORTABLE CHEST 1 VIEW COMPARISON:  04/16/2017 FINDINGS: Cardiac enlargement. Bilateral perihilar airspace disease is increasing since previous study suggesting increasing pneumonia or edema. Small bilateral pleural effusions also may be increasing. No pneumothorax. Tortuous aorta. Thoracolumbar scoliosis. IMPRESSION: Cardiac enlargement peer at progressing bilateral perihilar airspace disease and small bilateral pleural effusions. Electronically Signed   By: Lucienne Capers M.D.   On: 04/17/2017 02:44   Dg Chest Port 1 View  Result Date: 04/16/2017 CLINICAL DATA:  Shortness of breath. EXAM: PORTABLE CHEST 1 VIEW COMPARISON:  Chest x-ray dated March 23, 2017. FINDINGS: Stable cardiomegaly. The lungs remain hyperinflated. Unchanged opacity in the right mid and lower lung with adjacent layering pleural fluid in the fissures. No pneumothorax. No acute osseous abnormality. IMPRESSION: 1. Unchanged right mid and lower lung airspace opacity and adjacent pleural effusion. 2. Stable cardiomegaly. Electronically Signed   By: Titus Dubin M.D.   On: 04/16/2017 11:07   Dg Swallowing Func-speech Pathology  Result Date: 04/19/2017 Objective Swallowing Evaluation: Type of Study: MBS-Modified Barium Swallow Study Patient Details  Name: Ilah Boule MRN: [id number] Date of Birth: [birth year] Today's Date: 04/19/2017 Time: SLP Start Time (ACUTE ONLY): 1256-SLP Stop Time (ACUTE ONLY): 1310 SLP Time Calculation (min) (ACUTE ONLY): 14 min Past Medical History: Past Medical History: Diagnosis Date . Atrial flutter ([place])  . COPD (chronic obstructive pulmonary disease) ([place])  . CVA (cerebral vascular accident) ([place])  . 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 Diastolic heart failure ([place])  . HTN (hypertension)  . PAD (peripheral artery disease) ([place])  . Pancreatitis  . Seizures ([place])  Past Surgical History: Past Surgical History: Procedure Laterality Date . CENTRAL LINE INSERTION  07/21/2016    . SHOULDER SURGERY Left  HPI: Pt is a 61 y/o F with dementia, followed at [facility], former polysubstance abuse, heavy alcohol (quit in 2008) who presented with weakness in the setting of hypoglycemia. She was also found to have hypotension and hypothermia. Initial chest x-ray was concerning for a right-sided airspace disease. Subsequent CT with complex findings including: bilateral ground glass opacities, traction bronchiectasis, nodular airspace disease, emphysema, loculated pleural effusion on the R and mediastinal lymphadenopathy. MBS was ordered to assess for potential for chronic aspiration. Subjective: pt alert, pleasant, denies trouble swallowing Assessment / Plan / Recommendation CHL IP CLINICAL IMPRESSIONS 04/19/2017 Clinical Impression Pt's oropharyngeal swallow is WFL with no aspiration observed despite challenging with multiple large, sequential straw sips of thin liquids and large solid boluses without dentures in place. There could be the potential for her to have episodic aspiration in the setting of dyspnea and/or given her cognitive status, but this could not be elicited during testing today. Recommend that she continue with a regular diet and thin liquids. No additional SLP f/u indicated. SLP Visit Diagnosis Dysphagia, unspecified (R13.10) Attention and  concentration deficit following -- Frontal  lobe and executive function deficit following -- Impact on safety and function Mild aspiration risk   CHL IP TREATMENT RECOMMENDATION 04/19/2017 Treatment Recommendations No treatment recommended at this time   No flowsheet data found. CHL IP DIET RECOMMENDATION 04/19/2017 SLP Diet Recommendations Regular solids;Thin liquid Liquid Administration via Cup;Straw Medication Administration Whole meds with liquid Compensations Slow rate;Small sips/bites Postural Changes Seated upright at 90 degrees   CHL IP OTHER RECOMMENDATIONS 04/19/2017 Recommended Consults -- Oral Care Recommendations Oral care QID Other Recommendations --   CHL IP FOLLOW UP RECOMMENDATIONS 04/19/2017 Follow up Recommendations 24 hour supervision/assistance   No flowsheet data found.     CHL IP ORAL PHASE 04/19/2017 Oral Phase WFL Oral - Pudding Teaspoon -- Oral - Pudding Cup -- Oral - Honey Teaspoon -- Oral - Honey Cup -- Oral - Nectar Teaspoon -- Oral - Nectar Cup -- Oral - Nectar Straw -- Oral - Thin Teaspoon -- Oral - Thin Cup -- Oral - Thin Straw -- Oral - Puree -- Oral - Mech Soft -- Oral - Regular -- Oral - Multi-Consistency -- Oral - Pill -- Oral Phase - Comment --  CHL IP PHARYNGEAL PHASE 04/19/2017 Pharyngeal Phase WFL Pharyngeal- Pudding Teaspoon -- Pharyngeal -- Pharyngeal- Pudding Cup -- Pharyngeal -- Pharyngeal- Honey Teaspoon -- Pharyngeal -- Pharyngeal- Honey Cup -- Pharyngeal -- Pharyngeal- Nectar Teaspoon -- Pharyngeal -- Pharyngeal- Nectar Cup -- Pharyngeal -- Pharyngeal- Nectar Straw -- Pharyngeal -- Pharyngeal- Thin Teaspoon -- Pharyngeal -- Pharyngeal- Thin Cup -- Pharyngeal -- Pharyngeal- Thin Straw -- Pharyngeal -- Pharyngeal- Puree -- Pharyngeal -- Pharyngeal- Mechanical Soft -- Pharyngeal -- Pharyngeal- Regular -- Pharyngeal -- Pharyngeal- Multi-consistency -- Pharyngeal -- Pharyngeal- Pill -- Pharyngeal -- Pharyngeal Comment --  CHL IP CERVICAL ESOPHAGEAL PHASE 04/19/2017 Cervical  Esophageal Phase WFL Pudding Teaspoon -- Pudding Cup -- Honey Teaspoon -- Honey Cup -- Nectar Teaspoon -- Nectar Cup -- Nectar Straw -- Thin Teaspoon -- Thin Cup -- Thin Straw -- Puree -- Mechanical Soft -- Regular -- Multi-consistency -- Pill -- Cervical Esophageal Comment -- No flowsheet data found. Germain Osgood 04/19/2017, 2:52 PM  Germain Osgood, M.A. CCC-SLP [phone]              Results/Tests Pending at Time of Discharge:  [facility]     Ordered   05/10/17 0500  Creatinine, serum  (enoxaparin (LOVENOX)    CrCl >/= 30 ml/min)  Weekly,   R    Comments:  while on enoxaparin therapy    05/03/17 2006   05/05/17 6389  Basic metabolic panel  Daily,   R     05/04/17 2346       Discharge Medications:  Allergies as of 05/08/2017   No Known Allergies     Medication List    TAKE these medications   acetaminophen 500 MG tablet Commonly known as:  TYLENOL Take 1,000 mg by mouth 2 (two) times daily as needed (for various aches and pains).   amiodarone 200 MG tablet Commonly known as:  PACERONE Take 1 tablet (200 mg total) by mouth 2 (two) times daily.   aspirin 81 MG EC tablet Take 1 tablet (81 mg total) by mouth daily.   donepezil 10 MG tablet Commonly known as:  ARICEPT Take 10 mg by mouth at bedtime.   feeding supplement (ENSURE ENLIVE) Liqd Take 237 mLs by mouth 3 (three) times daily between meals.   lamoTRIgine 100 MG tablet Commonly known as:  LAMICTAL Take 100 mg by mouth 2 (  two) times daily.   latanoprost 0.005 % ophthalmic solution Commonly known as:  XALATAN Place 1 drop into both eyes at bedtime.   levofloxacin 500 MG tablet Commonly known as:  LEVAQUIN Take 1 tablet (500 mg total) by mouth daily. What changed:  medication strength  how much to take  when to take this   lipase/protease/amylase 12000 units Cpep capsule Commonly known as:  CREON Take 1 capsule (12,000 Units total) by mouth 3 (three) times daily before meals.    memantine 5 MG tablet Commonly known as:  NAMENDA Take 1 tablet (5 mg total) by mouth 2 (two) times daily.   Vitamin D3 2000 units Tabs Take 2,000 Units by mouth daily.            Durable Medical Equipment        Start     Ordered   05/08/17 0000  For home use only DME oxygen    Question Answer Comment  Mode or (Route) Nasal cannula   Oxygen delivery system Gas      05/08/17 1514      Discharge Instructions: Please refer to Patient Instructions section of EMR for full details.  Patient was counseled important signs and symptoms that should prompt return to medical care, changes in medications, dietary instructions, activity restrictions, and follow up appointments.   Follow-Up Appointments: Contact information for after-discharge care    Destination    [facility] .   Specialty:  [place] information: [address] [place] [phone]              Caroline More, [place] 05/08/2017, 3:15 PM PGY-1, [facility]

## 2017-05-04 NOTE — Evaluation (Signed)
Physical Therapy Evaluation Patient Details Name: Brittany [name] MRN: [id number] DOB: [birth year] Today's Date: 05/04/2017   History of Present Illness  Brittany [name] is a 61 y.o. female presenting with shortness of breath in the setting of recent volume overload concerning for acute CHF exacerbation. PMH is significant for frequent falls, chronic diastolic heart failure, persistent atrial fibrillation/flutter, CKD3, HTN, seizure, dementia, COPD, and CVA.  Clinical Impression  Pt admitted with above diagnosis. Pt currently with functional limitations due to the deficits listed below (see PT Problem List). Pt was able to ambulate a short distance.  Limited by poor endurance and poor postural stability.   Will need SNF at d/c.  Will follow acutely.  VSS.  Pt on 3LO2 and when on RA O2 would not register therefore left O2 on.  Pt will benefit from skilled PT to increase their independence and safety with mobility to allow discharge to the venue listed below.      Follow Up Recommendations SNF;Supervision/Assistance - 24 hour    Equipment Recommendations  Other (comment) (TBA)    Recommendations for Other Services       Precautions / Restrictions Precautions Precautions: Fall Restrictions Weight Bearing Restrictions: No      Mobility  Bed Mobility Overal bed mobility: Needs Assistance Bed Mobility: Supine to Sit     Supine to sit: Min assist     General bed mobility comments: neeeded assist for elevation of trunk.  Had to take pt off bedpan and she had large BM.   Transfers Overall transfer level: Needs assistance Equipment used: Rolling walker (2 wheeled) Transfers: Sit to/from Stand Sit to Stand: Min assist         General transfer comment: cues for hand placement, assist to steady upon rising. Pt trransferred to 3N1 and urinated and then ambulated around bed.   Ambulation/Gait Ambulation/Gait assistance: Min assist;+2 safety/equipment;Mod assist Ambulation Distance  (Feet): 30 Feet Assistive device: Rolling walker (2 wheeled) Gait Pattern/deviations: Step-to pattern;Decreased step length - right;Decreased step length - left;Decreased stride length;Decreased weight shift to right;Decreased weight shift to left;Shuffle;Leaning posteriorly;Staggering left;Staggering right;Drifts right/left;Trunk flexed;Wide base of support Gait velocity: slower Gait velocity interpretation: Below normal speed for age/gender General Gait Details: Pt takes small steps bilaterally.  Pt daughter reports gait much worse than prior to hospitalization. Pt needed cues to sequence steps and RW as well as assist to stay close to RW.  Poor postural stability with pt staggering quite a bit.  Flexed posture as well.  Poor endurance and needed assist to control descent into chair.   Stairs            Wheelchair Mobility    Modified Rankin (Stroke Patients Only)       Balance Overall balance assessment: Needs assistance Sitting-balance support: No upper extremity supported;Feet supported Sitting balance-Leahy Scale: Fair     Standing balance support: Bilateral upper extremity supported;During functional activity Standing balance-Leahy Scale: Poor Standing balance comment: Needed bil UE support for balance and external support by PT due to poor postural stability.                              Pertinent Vitals/Pain Pain Assessment: No/denies pain    Home Living Family/patient expects to be discharged to:: Private residence Living Arrangements: Children Available Help at Discharge: Family;Available 24 hours/day;Personal care attendant ([facility] services day and family evenings) Type of Home: House Home Access: Stairs to enter Entrance Stairs-Rails: Can reach both Entrance  Stairs-Number of Steps: 2 Home Layout: Two level;Bed/bath upstairs Home Equipment: Walker - 2 wheels;Cane - single point;Bedside commode;Shower seat;Grab bars - tub/shower Additional Comments:  Pt is an unreliable historian, and daughter arrived towards end of session    Prior Function Level of Independence: Independent with assistive device(s)         Comments: Uses cane for longer distance, often nothing in the home.  Pt recently in [place] for therapy but got sick and came back to hospital before completing her rehab.  PRior to that, pt attends Adult day program 5x/week with [facility]      Hand Dominance   Dominant Hand: Right    Extremity/Trunk Assessment   Upper Extremity Assessment Upper Extremity Assessment: Defer to OT evaluation    Lower Extremity Assessment Lower Extremity Assessment: Generalized weakness    Cervical / Trunk Assessment Cervical / Trunk Assessment: Kyphotic  Communication   Communication: No difficulties  Cognition Arousal/Alertness: Awake/alert Behavior During Therapy: WFL for tasks assessed/performed Overall Cognitive Status: History of cognitive impairments - at baseline                                 General Comments: dementia      General Comments General comments (skin integrity, edema, etc.): Pt daughter reports they are looking at pt going to [place] or facility in [place].     Exercises     Assessment/Plan    PT Assessment Patient needs continued PT services  PT Problem List Decreased strength;Decreased activity tolerance;Decreased balance;Decreased mobility;Decreased knowledge of use of DME;Decreased safety awareness       PT Treatment Interventions DME instruction;Gait training;Stair training;Functional mobility training;Therapeutic activities;Balance training;Patient/family education    PT Goals (Current goals can be found in the Care Plan section)  Acute Rehab PT Goals Patient Stated Goal: get home.  able to walk better PT Goal Formulation: With patient Time For Goal Achievement: 05/18/17 Potential to Achieve Goals: Good    Frequency Min 2X/week   Barriers to discharge Decreased caregiver  support      Co-evaluation               AM-PAC PT "6 Clicks" Daily Activity  Outcome Measure Difficulty turning over in bed (including adjusting bedclothes, sheets and blankets)?: A Little Difficulty moving from lying on back to sitting on the side of the bed? : A Little Difficulty sitting down on and standing up from a chair with arms (e.g., wheelchair, bedside commode, etc,.)?: A Lot Help needed moving to and from a bed to chair (including a wheelchair)?: A Lot Help needed walking in hospital room?: A Lot Help needed climbing 3-5 steps with a railing? : Total 6 Click Score: 13    End of Session Equipment Utilized During Treatment: Gait belt;Oxygen Activity Tolerance: Patient limited by fatigue Patient left: in chair;with call bell/phone within reach;with chair alarm set;with family/visitor present Nurse Communication: Mobility status PT Visit Diagnosis: Unsteadiness on feet (R26.81)    Time: 5397-6734 PT Time Calculation (min) (ACUTE ONLY): 25 min   Charges:   PT Evaluation $PT Eval Moderate Complexity: 1 Mod PT Treatments $Gait Training: 8-22 mins   PT G Codes:        Brittany [name],PT Acute Rehabilitation [phone] (pager)   Denice Paradise 05/04/2017, 2:47 PM

## 2017-05-04 NOTE — Progress Notes (Signed)
Family Medicine Teaching Service Daily Progress Note Intern Pager: [phone]  Patient name: Brittany [name] Medical record number: [id number] Date of birth: 1956-07-01 Age: 61 y.o. Gender: female  Primary Care Provider: System, Pcp Not In Consultants: cardiology Code Status: full  Pt Overview and Major Events to Date:  Brittany Robinsonis a 61 y.o.femalepresenting with shortness of breath in the setting of recent volume overload concerning for acute CHF exacerbation. PMH is significant for frequent falls, chronic diastolic heart failure, persistent atrial fibrillation/flutter, CKD3, HTN, seizure, dementia, COPD, and CVA.  Assessment and Plan: Brittany Robinsonis a 61 y.o.femalepresenting with shortness of breath in the setting of recent volume overload concerning for acute CHF exacerbation. PMH is significant for frequent falls, chronic diastolic heart failure, persistent atrial fibrillation/flutter, CKD3, HTN, seizure, dementia, COPD, and CVA.  Dyspnea, acute, stable, now with "pink" tinged sputum per nursing  CXR showed "Worsening dense bilateral perihilar areas of consolidation. Stable small effusions."  Given crackles on exam and trace/1+ pitting edema in LE, shortness of breath is likely secondary to fluid overload which would be consistent with CHF exacerbation. BNP was 1127.  Also important to consider infectious component as patient was complaining of productive cough but is afebrile currently and WBC is wnl. Imaging did not show any infiltrates concerning for PNA.There was also a plan for repeat CT in 6 wks after prior admission to evaluate for potential lung mass so it's possible there might a malignant component to these pulmonary issues as well. PE would also be part of the differential in the setting of possible lung carcinoma. No unilateral leg swelling, or pleuritic chest pain, hemoptysis that would be more concerning. Wells score 0. Lastly shortness of breath could be secondary to  cardiac process. Trop was 0.04 and EKG showed afib normal rate on admission. No chest pain reported. Denies any nausea, or diaphoresis. -Admit to FMTS, admitting attending Dr. Andria Frames -Place patient on telemetry -hold IV fluids -Oxygen therapy as needed -daily weights -strict I/O -Follow up on am CBC and BMP -Cardiology consult, appreciate recs -Consider CTA chest for PE  -will likely add Abx if she becomes febrile  -PT/OT -Acetaminophen 650 mg q6 prn -Zofran 4 mg prn  Diastolic CHF consistent problems with salt/fluid intake in the past. BNP 1127 (was 1800 on last admission). Last echo 7/18 showed 50-55%EF. Patient was on lasix prior to last admission but held after last admission due to low BP. -IV lasix per cardio (started 40 daily) -Daily weights -Monitor I's and O's  Acute on chronic kidney disease stage III, stable   still making urine, follows with urology. Patient is pre-dialysis. Creatinine 1.01 w/ baseline of 1.5-1.98 earlier this year. Improved still last admission. -strict I/O -Follow-up on a.m. BMP  Afib, tachy to 100s-110s Patient followed by cardiology. Patient is rate controlled with amiodarone and option to add metoprolol if needed and BP is stable. Patient is currently not on anticoagulation beyond aspirin 81 due to increased risk of bleeding with history of frequent falls.Patient has multiple afib treatments in their past medical record. -Consulted Cardiology, appreciate recs  -Amiodarone 200 mg BID home dose -restart metoprolol per cardio (started 12.5 bid)  AMS/Dementia-at baseline from last admission,.. - home memantine to 5mg   - home donepezil 10  Pancreatitis, stable History of pancreatitis on Creon. Decrease dose during last admission due to loose stools. Patient with mild tenderness on palpation in the epigastric region. Reports one loose stool prior to admission. Will monitor given previous admission and antibiotics regimen. Would like  to rule  out C.diff if symptoms persists. -Continue home Creon  Seizure disorder Appears to be well controlled on home medication. No seizure activity reported in the setting of hypoglycemia. Will continue home regimen. --Lamictal 100 mg twice a day --Keppra 700 mg twice a day  Glaucoma -Continue latanoprost drops   Weight Loss-2lbs lost since last admission and 10lbs in the 2 months prior to that, had long discussion with family about their restrictions on salt in her food and her subsequent refusal to eat anything that doesn't have salt.  Could be malnutrition or possible malignancy. -will consult pulm to consider further workup for suscpision of malignancy from prior admission -instructed nursing to allow a few salt packets per meal if she is refusing the meal tray   FEN/GI: Heart healthy diet, pantoprazole,  Prophylaxis: lovenox/aspirin 81mg   Disposition: tele, then likely home with son f/u by [facility] on discharge  Subjective:  No pain reported, felt she was doing fine but didn't like the hospital food.  We discussed again the need for her to eat (she ate half of breakfast)  Nursing reported pink tinged sputum  Objective: Temp:  [97.5 F (36.4 C)-98.9 F (37.2 C)] 98.9 F (37.2 C) (10/19 0407) Pulse Rate:  [39-115] 104 (10/19 0407) Resp:  [18-31] 18 (10/19 0407) BP: (124-146)/(94-111) 124/97 (10/19 0407) SpO2:  [89 %-100 %] 100 % (10/19 0407) FiO2 (%):  [28 %] 28 % (10/18 2007) Weight:  [90 lb 14.4 oz (41.2 kg)-92 lb 12.8 oz (42.1 kg)] 92 lb 12.8 oz (42.1 kg) (10/19 0407) Physical Exam: General: cachectic, alert, appears older than age Eyes: clear sclera ENTM: no lesions/injuries noted Neck: ROM intact Cardiovascular: tachycardic low 100s, irregular rhythm, no murmus noted on ausculation Respiratory: Bilateral lower lung fields crackles , ausculation difficult given poor respiratory effor tof patient.  Unsure whether this was due to inability/weakness or dementia related  non-compliance Gastrointestinal: bowel sounds in all quadrants, RUQ/epigastic pain on palpation MSK: 5/5 strength in all limbs although at levels expected for a cachectic female, bilateral +1 pitting edema Derm: no lesions noted Neuro: no deficits noted grossly Psych: demented, able to have conversation by detail recall is severaly limited  Laboratory:  Recent Labs Lab 05/03/17 0945  WBC 7.2  HGB 11.9*  HCT 36.3  PLT 178    Recent Labs Lab 05/03/17 0945  NA 136  K 3.9  CL 98*  CO2 24  BUN 12  CREATININE 1.18*  CALCIUM 9.1  PROT 7.2  BILITOT 1.2  ALKPHOS 140*  ALT 31  AST 55*  GLUCOSE 85    Imaging/Diagnostic Tests: Dg Chest Port 1 View  Result Date: 05/03/2017 CLINICAL DATA:  Shortness of Breath EXAM: PORTABLE CHEST 1 VIEW COMPARISON:  04/17/2017 FINDINGS: There is hyperinflation of the lungs compatible with COPD. There is cardiomegaly. Dense consolidation noted in the lungs bilaterally, slightly worsened since prior study. Small effusions again noted. IMPRESSION: Worsening dense bilateral perihilar areas of consolidation. Stable small effusions. COPD, cardiomegaly. Electronically Signed   By: Rolm Baptise M.D.   On: 05/03/2017 10:01     Sherene Sires, DO 05/04/2017, 6:22 AM PGY-1, [facility] Intern pager: [phone], text pages welcome

## 2017-05-04 NOTE — Progress Notes (Signed)
Patient's daughter requested an update from physician.  Dr. Sherene Sires contacted via telephone and spoke with daughter while she is at bedside.  Patient's daughter stated she received updated information she was wanting.

## 2017-05-04 NOTE — Progress Notes (Addendum)
Progress Note  Patient Name: Brittany [name] Date of Encounter: 05/04/2017  Primary Cardiologist: Dr. Tamala Julian  Subjective   Denies any chest pain or SOB  Inpatient Medications    Scheduled Meds: . amiodarone  200 mg Oral BID  . aspirin EC  81 mg Oral Daily  . donepezil  10 mg Oral QHS  . enoxaparin (LOVENOX) injection  30 mg Subcutaneous Q24H  . feeding supplement (ENSURE ENLIVE)  237 mL Oral TID BM  . furosemide  40 mg Intravenous Daily  . insulin aspart  0-9 Units Subcutaneous TID WC  . lamoTRIgine  100 mg Oral BID  . latanoprost  1 drop Both Eyes QHS  . lipase/protease/amylase  12,000 Units Oral TID AC  . memantine  5 mg Oral BID  . metoprolol tartrate  12.5 mg Oral BID   Continuous Infusions:  PRN Meds: acetaminophen **OR** acetaminophen, ondansetron **OR** ondansetron (ZOFRAN) IV   Vital Signs    Vitals:   05/03/17 2100 05/04/17 0142 05/04/17 0407 05/04/17 1003  BP: (!) 131/94 (!) 130/99 (!) 124/97 112/87  Pulse: (!) 39 (!) 115 (!) 104 (!) 112  Resp: 18 18 18    Temp: 98.4 F (36.9 C) (!) 97.5 F (36.4 C) 98.9 F (37.2 C)   TempSrc: Oral Oral Oral   SpO2: 96% 92% 100%   Weight: 92 lb 12.8 oz (42.1 kg)  92 lb 12.8 oz (42.1 kg)   Height: 5\' 3"  (1.6 m)       Intake/Output Summary (Last 24 hours) at 05/04/17 1035 Last data filed at 05/04/17 0916  Gross per 24 hour  Intake              600 ml  Output              275 ml  Net              325 ml   Filed Weights   05/03/17 0825 05/03/17 2100 05/04/17 0407  Weight: 90 lb 14.4 oz (41.2 kg) 92 lb 12.8 oz (42.1 kg) 92 lb 12.8 oz (42.1 kg)    Telemetry    Atrial fibrillation with HR in 90's - Personally Reviewed  ECG    Atrial flutter with variable block and ST/T wave abnormality c/w anterior ischemia - Personally Reviewed  Physical Exam   GEN: No acute distress.   Neck: No JVD Cardiac: irregularly irregular no murmurs, rubs, or gallops.  Respiratory: scattered wheezes and crackles at bases GI:  Soft, nontender, non-distended  MS: No edema; No deformity. Neuro:  Nonfocal  Psych: Normal affect   Labs    Chemistry Recent Labs Lab 05/03/17 0945 05/04/17 0609  NA 136 132*  K 3.9 4.6  CL 98* 95*  CO2 24 27  GLUCOSE 85 87  BUN 12 12  CREATININE 1.18* 1.01*  CALCIUM 9.1 8.5*  PROT 7.2  --   ALBUMIN 3.3*  --   AST 55*  --   ALT 31  --   ALKPHOS 140*  --   BILITOT 1.2  --   GFRNONAA 49* 60*  GFRAA 57* >60  ANIONGAP 14 10     Hematology Recent Labs Lab 05/03/17 0945 05/04/17 0609  WBC 7.2 7.9  RBC 4.66 4.20  HGB 11.9* 10.5*  HCT 36.3 32.3*  MCV 77.9* 76.9*  MCH 25.5* 25.0*  MCHC 32.8 32.5  RDW 18.9* 18.4*  PLT 178 183    Cardiac Enzymes Recent Labs Lab 05/03/17 0945  TROPONINI 0.04*   No  results for input(s): TROPIPOC in the last 168 hours.   BNP Recent Labs Lab 05/03/17 0945  BNP 1,127.0*     DDimer No results for input(s): DDIMER in the last 168 hours.   Radiology    Dg Chest Port 1 View  Result Date: 05/03/2017 CLINICAL DATA:  Shortness of Breath EXAM: PORTABLE CHEST 1 VIEW COMPARISON:  04/17/2017 FINDINGS: There is hyperinflation of the lungs compatible with COPD. There is cardiomegaly. Dense consolidation noted in the lungs bilaterally, slightly worsened since prior study. Small effusions again noted. IMPRESSION: Worsening dense bilateral perihilar areas of consolidation. Stable small effusions. COPD, cardiomegaly. Electronically Signed   By: Rolm Baptise M.D.   On: 05/03/2017 10:01    Cardiac Studies   2D echo 01/2017 Study Conclusions  - Left ventricle: The cavity size was normal. Wall thickness was   increased in a pattern of mild LVH. Systolic function was normal.   The estimated ejection fraction was in the range of 50% to 55%.   There is hypokinesis of the apical myocardium. The study is not   technically sufficient to allow evaluation of LV diastolic   function. - Mitral valve: Calcified annulus. There was mild  regurgitation. - Left atrium: The atrium was severely dilated. - Right atrium: The atrium was severely dilated. - Pulmonary arteries: Systolic pressure was mildly to moderately   increased. PA peak pressure: 47 mm Hg (S).  Impressions:  - Apical hypokinesis with overall low normal LV systolic function;   mild LVH; mild MR; severe biatrial enlargement; mild TR with mild   to moderate elevation in pulmonary pressure.  Patient Profile     61 y.o. female with a hx of afib/flutter RVR (not on AC for falls), diastolic heart failure, frequent falls, dementia, CVA, CKD stage III, COPD, seizure disorder, pancreatitis, pulmonary HTN, PAD, and alcoholism who is being seen for evaluation of Atrial fibrillation/flutter with RVR.  Assessment & Plan    1. Permanent Atrial fibrillation/flutter - she is generally rate controlled on home amiodarone - HR here in the upper 90's to low 100's. - will increase Lopressor to 25mg  BID for better rate control  - continue low dose Amio for rate control. - HR will likely improve as her pulmonary status improves and as she is adequately diuresed - previously deemed not an anticoagulation candidate given her recent frequent falls  2. Acute on chronic diastolic heart failure -  her SOB is likely multifactorial given her history of diastolic heart failure not on a diuretic and elevated BNP, COPD, and likely PNA - echo with normal LV function in July 2018 - BNP at admission was 1127 - sCr 1.18 on admission, last discharge creatinine was 1.29 (04/20/17).  Today 1.01. - she was instructed to stop lasix at her last discharge (04/20/17) and likely this is reason for acute decompensation- I&O's appear incomplete overnight and appears positive 325cc.   - weight is up 2 lbs.  - TSH is normal - will increase lasix to 40mg  IV BID - follow renal function and lytes while diuresing - The patient has not wished to be compliant with sodium restrictions in the past and has had  multipld hospitalizations for acute CHF exacerbations. She has failure to thrive and stops eating if she is not able to use salt.  3.  HTN - BP is well controlled today but diastolic BP up yesterday. - increasing BB.  4.  Moderate pulmonary HTN with PASP 30mmHg on recent echo - likely Group 2 related  to pulmonary venous HTN from diastolic CHF and should improve with diuresis  5.  Elevated troponin at 0.04 with flat trend (0.03>0.04>0.04) - likely related to CHF - she has never had an ischemic workup and has ischemic appearing ST changes in the anterior leads.   - consider Lexiscan myoview prior to d/c once euvolemia to rule out ischemia  For questions or updates, please contact [place] Please consult [email or web address] for contact info under Cardiology/STEMI.      Signed, Fransico Him, MD  05/04/2017, 10:35 AM

## 2017-05-04 NOTE — Progress Notes (Signed)
  REGARDING SNF PLACEMENT  Son is legal guardian (number/name in demographics) and she lives with him currently, I confirmed with him that he wants her to go to a facility as he can't provide adequate care/supervision for her at home.   He is aware that she denied this when you all went to speak to her and he states that the safest thing for her is to go to a facility.   He was attentive and appropriate in all my interactions with him so far and I think he is making there decision he thinks is best.   You may contact him directly to confirm.

## 2017-05-05 DIAGNOSIS — E43 Unspecified severe protein-calorie malnutrition: Secondary | ICD-10-CM

## 2017-05-05 DIAGNOSIS — I482 Chronic atrial fibrillation: Secondary | ICD-10-CM

## 2017-05-05 DIAGNOSIS — R0602 Shortness of breath: Secondary | ICD-10-CM

## 2017-05-05 LAB — BASIC METABOLIC PANEL
Anion gap: 12 (ref 5–15)
BUN: 19 mg/dL (ref 6–20)
CO2: 33 mmol/L — ABNORMAL HIGH (ref 22–32)
CREATININE: 1.31 mg/dL — AB (ref 0.44–1.00)
Calcium: 8.9 mg/dL (ref 8.9–10.3)
Chloride: 87 mmol/L — ABNORMAL LOW (ref 101–111)
GFR calc Af Amer: 51 mL/min — ABNORMAL LOW (ref >60)
GFR calc non Af Amer: 44 mL/min — ABNORMAL LOW (ref >60)
GLUCOSE: 78 mg/dL (ref 65–99)
POTASSIUM: 3.9 mmol/L (ref 3.5–5.1)
Sodium: 132 mmol/L — ABNORMAL LOW (ref 135–145)

## 2017-05-05 LAB — GLUCOSE, CAPILLARY
GLUCOSE-CAPILLARY: 70 mg/dL (ref 65–99)
GLUCOSE-CAPILLARY: 100 mg/dL — AB (ref 65–99)
Glucose-Capillary: 58 mg/dL — ABNORMAL LOW (ref 65–99)
Glucose-Capillary: 93 mg/dL (ref 65–99)
Glucose-Capillary: 100 mg/dL — ABNORMAL HIGH (ref 65–99)

## 2017-05-05 LAB — CBC
HCT: 35.4 % — ABNORMAL LOW (ref 36.0–46.0)
Hemoglobin: 11.5 g/dL — ABNORMAL LOW (ref 12.0–15.0)
MCH: 25.1 pg — AB (ref 26.0–34.0)
MCHC: 32.5 g/dL (ref 30.0–36.0)
MCV: 77.3 fL — AB (ref 78.0–100.0)
PLATELETS: 182 10*3/uL (ref 150–400)
RBC: 4.58 MIL/uL (ref 3.87–5.11)
RDW: 18.3 % — AB (ref 11.5–15.5)
WBC: 6.3 10*3/uL (ref 4.0–10.5)

## 2017-05-05 LAB — PROCALCITONIN: PROCALCITONIN: 0.14 ng/mL

## 2017-05-05 MED ORDER — SODIUM CHLORIDE 0.9 % IV BOLUS (SEPSIS)
250.0000 mL | Freq: Once | INTRAVENOUS | Status: AC
  Administered 2017-05-05: 250 mL via INTRAVENOUS

## 2017-05-05 MED ORDER — FUROSEMIDE 20 MG PO TABS
20.0000 mg | ORAL_TABLET | Freq: Every day | ORAL | Status: DC
  Administered 2017-05-05 – 2017-05-06 (×2): 20 mg via ORAL
  Filled 2017-05-05 (×2): qty 1

## 2017-05-05 MED ORDER — METOPROLOL TARTRATE 12.5 MG HALF TABLET
12.5000 mg | ORAL_TABLET | Freq: Two times a day (BID) | ORAL | Status: DC
  Administered 2017-05-05 – 2017-05-06 (×3): 12.5 mg via ORAL
  Filled 2017-05-05 (×5): qty 1

## 2017-05-05 MED ORDER — LEVOFLOXACIN 500 MG PO TABS
500.0000 mg | ORAL_TABLET | Freq: Every day | ORAL | Status: DC
  Administered 2017-05-05 – 2017-05-08 (×4): 500 mg via ORAL
  Filled 2017-05-05 (×4): qty 1

## 2017-05-05 NOTE — Progress Notes (Signed)
Patient BP 90/58 manually, Paged MD. Orders to give metoprolol and also give bolus.

## 2017-05-05 NOTE — Progress Notes (Signed)
Family Medicine Teaching Service Daily Progress Note Intern Pager: [phone]  Patient name: Brittany [name] Medical record number: [id number] Date of birth: Mar 30, 1956 Age: 61 y.o. Gender: female  Primary Care Provider: System, Pcp Not In Consultants: cardiology, PT/OT, CSW Code Status: full  Pt Overview and Major Events to Date:  10/18- admitted to [facility] with dyspnea  Assessment and Plan: Brittany Robinsonis a 61 y.o.femalepresenting with shortness of breath in the setting of recent volume overload concerning for acute CHF exacerbation. PMH is significant for frequent falls, chronic diastolic heart failure, persistent atrial fibrillation/flutter, CKD3, HTN, seizure, dementia, COPD, and CVA.  #Dyspnea- likely 2/2 CHF exacerbation, BNP elevated at 1127. However due to increased oxygen and worsening dyspnea 10/20 CT chest obtained demonstrating evidence of pneumonia. Pro-cal 0.16>0.14. WBC 6.3. -continue levaquin, day 2 -continue to monitor on telemetry -Oxygen therapy as needed, wean as able -treat CHF as below -Acetaminophen 650 mg q6 as needed for fever or pain -Zofran 4 mg as needed for nausea -PT/OT consulted, recommend SNF  #Diastolic CHF consistent problems with salt/fluid intake in the past. BNP 1127 (was 1800 on last admission). Last echo 7/18 showed 50-55%EF. Patient was on lasix prior to last admission but held after last admission due to low BP. -continue lasix 40 mg IV daily -cardiology following -Daily weights -Monitor I's and O's   #Acute on chronic kidney disease stage III, stable   still making urine, follows with urology. Patient is pre-dialysis. Creatinine 1.01 >1.31 today, w/ baseline of 1.5-1.98 earlier this year. -strict I/O -monitor creatinine closely  #Chronic Afib- on admission uncontrolled, tachy. Today HR in 80's.  -Cardiology following, appreciate recs  -continue Amiodarone 200 mg BID home dose -continue metoprolol 25 mg  daily  #AMS/Dementia-at baseline from last admission. Son legal guardian. Patient refusing SNF however per son he would like SNF at discharge - reconsulted CSW - anticipate d/c to SNF after weekend - home memantine to 5mg   - home donepezil 10  #Chronic Pancreatitis- stable -monitor closely for abdominal pain/diarrhea -rule out C.diff if patient has diarrhea -Continue home Creon  #Type 2 DM- last A1C 01/22/17 of 6.2 -continue SSI -CBGs AC/HS  #Seizure disorder- stable --Lamictal 100 mg twice a day --Keppra 700 mg twice a day  #Glaucoma- stable -Continue latanoprost drops  #Protein-calorie malnutrition- Weight Loss-possibly malnutrition or possible malignancy. -consider consult pulm to consider further workup for suscpision of malignancy from prior admission -instructed nursing to allow a few salt packets per meal if she is refusing the meal tray -continue ensure TID  FEN/GI: Heart healthy/carb modified diet, pantoprazole Prophylaxis: lovenox/aspirin 81mg   Disposition: SNF  Subjective:  Reports doing well, denies pain, denies fever, denies SOB.   Objective: Temp:  [97.5 F (36.4 C)-97.8 F (36.6 C)] 97.7 F (36.5 C) (10/20 0448) Pulse Rate:  [77-112] 80 (10/20 0800) Resp:  [18-20] 18 (10/20 0800) BP: (92-112)/(57-87) 92/57 (10/20 0800) SpO2:  [90 %-100 %] 100 % (10/20 0800) Weight:  [83 lb 12.8 oz (38 kg)] 83 lb 12.8 oz (38 kg) (10/20 0448) Physical Exam: General: cachectic, sitting up in bed eating breakfast,  in place, in NAD Cardiovascular: RRR no MRG Respiratory: crackles L > R, no increased WOB Gastrointestinal: soft, non-tender, non-distended, +BS MSK: +1 edema in LE bilaterally  Neuro: no deficits noted grossly  Laboratory:  Recent Labs Lab 05/03/17 0945 05/04/17 0609 05/05/17 0427  WBC 7.2 7.9 6.3  HGB 11.9* 10.5* 11.5*  HCT 36.3 32.3* 35.4*  PLT 178 183 182    Recent Labs  Lab 05/03/17 0945 05/04/17 0609 05/05/17 0427  NA 136 132*  132*  K 3.9 4.6 3.9  CL 98* 95* 87*  CO2 24 27 33*  BUN 12 12 19   CREATININE 1.18* 1.01* 1.31*  CALCIUM 9.1 8.5* 8.9  PROT 7.2  --   --   BILITOT 1.2  --   --   ALKPHOS 140*  --   --   ALT 31  --   --   AST 55*  --   --   GLUCOSE 85 87 78    Imaging/Diagnostic Tests: Dg Chest Port 1 View  Result Date: 05/03/2017 CLINICAL DATA:  Shortness of Breath EXAM: PORTABLE CHEST 1 VIEW COMPARISON:  04/17/2017 FINDINGS: There is hyperinflation of the lungs compatible with COPD. There is cardiomegaly. Dense consolidation noted in the lungs bilaterally, slightly worsened since prior study. Small effusions again noted. IMPRESSION: Worsening dense bilateral perihilar areas of consolidation. Stable small effusions. COPD, cardiomegaly. Electronically Signed   By: Rolm Baptise M.D.   On: 05/03/2017 10:01   Ct Chest W Contrast  Result Date: 05/04/2017 CLINICAL DATA:  Shortness of breath EXAM: CT CHEST WITH CONTRAST TECHNIQUE: Multidetector CT imaging of the chest was performed during intravenous contrast administration. CONTRAST:  58mL ISOVUE-300 IOPAMIDOL (ISOVUE-300) INJECTION 61% COMPARISON:  Radiograph 05/03/2017, CT 04/17/2017, 07/21/2016 FINDINGS: Cardiovascular: Aortic atherosclerosis. No aneurysmal dilatation. Enlarged pulmonary arterial trunk, measuring up to 3.7 cm with enlarged right and left main pulmonary artery branches. Cardiomegaly with biatrial enlargement and reflux of contrast into the hepatic veins consistent with elevated right heart pressure. No significant pericardial effusion. Mediastinum/Nodes: Midline trachea. No thyroid mass. Re- demonstrated mediastinal adenopathy, for example a 9 mm lymph node anterior to the trachea. No change. Esophagus within normal limits. Lungs/Pleura: Small to moderate right pleural effusion re- demonstrated with loculated fluid along the right pulmonary fissure, slightly decreased compared to the prior CT. Similar pattern of multifocal bilateral ground-glass  densities within the upper lobes and left lower lobe. Increased ground-glass density and consolidation in the right middle lobe. More confluent consolidations now visible in the left lower lobe and left greater than right upper lobes. Upper Abdomen: No acute abnormality is visible in the upper abdomen. Atrophic pancreas with scattered calcifications. Musculoskeletal: No acute or suspicious IMPRESSION: 1. Stable to slight decreased right pleural effusion. Re- demonstrated multifocal ground-glass densities within the bilateral lungs with new or increased ground-glass density and mild consolidation in the right middle lobe and right upper lobe. More confluent consolidations now visible within both upper lobes and the left lower lobes. Findings could reflect combination of multifocal pneumonia and associated mild pulmonary edema. 2. Stable cardiomegaly with biatrial enlargement. Reflux of contrast into the hepatic veins compatible with elevated right heart pressure. Enlarged pulmonary artery consistent with pulmonary hypertension. 3. Stable mild mediastinal adenopathy. Electronically Signed   By: Donavan Foil M.D.   On: 05/04/2017 22:17    Steve Rattler, DO 05/05/2017, 9:27 AM PGY-2, [facility] Intern pager: [phone], text pages welcome

## 2017-05-05 NOTE — Progress Notes (Signed)
Recheck pt BP- 80/60 (64) Paged physician- Doctor called back ok to give 12.5 mg of lopressor instead of 25 mg.  Recheck BP in 1 hour

## 2017-05-05 NOTE — Progress Notes (Signed)
Patient stable 7p-7a. Patient seems to be feeling better, more tolerance with exertion as well.

## 2017-05-05 NOTE — Plan of Care (Signed)
Problem: Safety: Goal: Ability to remain free from injury will improve Outcome: Progressing Provided education regarding orthostatic hypotension.  Importance of transitioning slowly from lying to sitting and from sitting to standing.   Problem: Pain Managment: Goal: General experience of comfort will improve Outcome: Progressing Provided education regarding pain management.  Monitored pain using 0-10 pain scale.   Problem: Bowel/Gastric: Goal: Will not experience complications related to bowel motility Outcome: Progressing Provided education regarding importance of ambulation and monitoring bowel complications.

## 2017-05-05 NOTE — Progress Notes (Signed)
Pt BP 92/57 MAP 66   Due for Lasix at 8 and Lopressor at 10.  Paged [facility] Family medicine teaching service.  Called back- Hold the lasix for now, re-evaluate BP for lopressor at 10.

## 2017-05-05 NOTE — Progress Notes (Signed)
Progress Note  Patient Name: Brittany [name] Date of Encounter: 05/05/2017  Primary Cardiologist: Dr. Tamala Julian  Subjective   No chest pain or dyspnea  Inpatient Medications    Scheduled Meds: . amiodarone  200 mg Oral BID  . aspirin EC  81 mg Oral Daily  . donepezil  10 mg Oral QHS  . enoxaparin (LOVENOX) injection  30 mg Subcutaneous Q24H  . feeding supplement (ENSURE ENLIVE)  237 mL Oral TID BM  . insulin aspart  0-9 Units Subcutaneous TID WC  . lamoTRIgine  100 mg Oral BID  . latanoprost  1 drop Both Eyes QHS  . levofloxacin  500 mg Oral Daily  . lipase/protease/amylase  12,000 Units Oral TID AC  . memantine  5 mg Oral BID  . metoprolol tartrate  12.5 mg Oral BID   Continuous Infusions:  PRN Meds: acetaminophen **OR** acetaminophen, ondansetron **OR** ondansetron (ZOFRAN) IV   Vital Signs    Vitals:   05/05/17 0448 05/05/17 0800 05/05/17 1111 05/05/17 1139  BP: 100/73 (!) 92/57 (!) 80/60   Pulse: 92 80 76   Resp: 20 18    Temp: 97.7 F (36.5 C)     TempSrc: Oral     SpO2: 93% 100% 100% 93%  Weight: 38 kg (83 lb 12.8 oz)     Height:        Intake/Output Summary (Last 24 hours) at 05/05/17 1205 Last data filed at 05/05/17 0631  Gross per 24 hour  Intake              960 ml  Output              800 ml  Net              160 ml   Filed Weights   05/03/17 2100 05/04/17 0407 05/05/17 0448  Weight: 42.1 kg (92 lb 12.8 oz) 42.1 kg (92 lb 12.8 oz) 38 kg (83 lb 12.8 oz)    Telemetry    Atrial fibrillation HR controlled - Personally Reviewed  Physical Exam   GEN: Frai/cachetic appearing Neck: supple Cardiac: irregularly irregular Respiratory: Diminished BS bases GI: Soft, NT/ND, no masses MS: No edema Neuro:  Grossly intact  Labs    Chemistry  Recent Labs Lab 05/03/17 0945 05/04/17 0609 05/05/17 0427  NA 136 132* 132*  K 3.9 4.6 3.9  CL 98* 95* 87*  CO2 24 27 33*  GLUCOSE 85 87 78  BUN 12 12 19   CREATININE 1.18* 1.01* 1.31*  CALCIUM 9.1  8.5* 8.9  PROT 7.2  --   --   ALBUMIN 3.3*  --   --   AST 55*  --   --   ALT 31  --   --   ALKPHOS 140*  --   --   BILITOT 1.2  --   --   GFRNONAA 49* 60* 44*  GFRAA 57* >60 51*  ANIONGAP 14 10 12      Hematology  Recent Labs Lab 05/03/17 0945 05/04/17 0609 05/05/17 0427  WBC 7.2 7.9 6.3  RBC 4.66 4.20 4.58  HGB 11.9* 10.5* 11.5*  HCT 36.3 32.3* 35.4*  MCV 77.9* 76.9* 77.3*  MCH 25.5* 25.0* 25.1*  MCHC 32.8 32.5 32.5  RDW 18.9* 18.4* 18.3*  PLT 178 183 182    Cardiac Enzymes  Recent Labs Lab 05/03/17 0945  TROPONINI 0.04*    BNP  Recent Labs Lab 05/03/17 0945  BNP 1,127.0*      Radiology  Ct Chest W Contrast  Result Date: 05/04/2017 CLINICAL DATA:  Shortness of breath EXAM: CT CHEST WITH CONTRAST TECHNIQUE: Multidetector CT imaging of the chest was performed during intravenous contrast administration. CONTRAST:  75mL ISOVUE-300 IOPAMIDOL (ISOVUE-300) INJECTION 61% COMPARISON:  Radiograph 05/03/2017, CT 04/17/2017, 07/21/2016 FINDINGS: Cardiovascular: Aortic atherosclerosis. No aneurysmal dilatation. Enlarged pulmonary arterial trunk, measuring up to 3.7 cm with enlarged right and left main pulmonary artery branches. Cardiomegaly with biatrial enlargement and reflux of contrast into the hepatic veins consistent with elevated right heart pressure. No significant pericardial effusion. Mediastinum/Nodes: Midline trachea. No thyroid mass. Re- demonstrated mediastinal adenopathy, for example a 9 mm lymph node anterior to the trachea. No change. Esophagus within normal limits. Lungs/Pleura: Small to moderate right pleural effusion re- demonstrated with loculated fluid along the right pulmonary fissure, slightly decreased compared to the prior CT. Similar pattern of multifocal bilateral ground-glass densities within the upper lobes and left lower lobe. Increased ground-glass density and consolidation in the right middle lobe. More confluent consolidations now visible in  the left lower lobe and left greater than right upper lobes. Upper Abdomen: No acute abnormality is visible in the upper abdomen. Atrophic pancreas with scattered calcifications. Musculoskeletal: No acute or suspicious IMPRESSION: 1. Stable to slight decreased right pleural effusion. Re- demonstrated multifocal ground-glass densities within the bilateral lungs with new or increased ground-glass density and mild consolidation in the right middle lobe and right upper lobe. More confluent consolidations now visible within both upper lobes and the left lower lobes. Findings could reflect combination of multifocal pneumonia and associated mild pulmonary edema. 2. Stable cardiomegaly with biatrial enlargement. Reflux of contrast into the hepatic veins compatible with elevated right heart pressure. Enlarged pulmonary artery consistent with pulmonary hypertension. 3. Stable mild mediastinal adenopathy. Electronically Signed   By: Donavan Foil M.D.   On: 05/04/2017 22:17    Cardiac Studies   2D echo 01/2017 Study Conclusions  - Left ventricle: The cavity size was normal. Wall thickness was   increased in a pattern of mild LVH. Systolic function was normal.   The estimated ejection fraction was in the range of 50% to 55%.   There is hypokinesis of the apical myocardium. The study is not   technically sufficient to allow evaluation of LV diastolic   function. - Mitral valve: Calcified annulus. There was mild regurgitation. - Left atrium: The atrium was severely dilated. - Right atrium: The atrium was severely dilated. - Pulmonary arteries: Systolic pressure was mildly to moderately   increased. PA peak pressure: 47 mm Hg (S).  Impressions:  - Apical hypokinesis with overall low normal LV systolic function;   mild LVH; mild MR; severe biatrial enlargement; mild TR with mild   to moderate elevation in pulmonary pressure.  Patient Profile     61 y.o. female with a hx of afib/flutter RVR (not on AC  for falls), diastolic heart failure, frequent falls, dementia, CVA, CKD stage III, COPD, seizure disorder, pancreatitis, pulmonary HTN, PAD, and alcoholism who is being seen for evaluation of Atrial fibrillation/flutter with RVR.  Assessment & Plan    1. Permanent Atrial fibrillation/flutter -Heart rate is now controlled. Continue amiodarone at 200 mg daily. Continue metoprolol 12.5 mg twice a day. She is not an anticoagulation candidate. Continue aspirin.  2. Acute on chronic diastolic heart failure -   patient does not appear to be volume overloaded on examination. Her dyspnea is felt to be multifactorial including diastolic congestive heart failure and COPD. Also being treated for pneumonia.  Will treat with Lasix 20 mg daily and follow renal function.  3.  HTN  -blood pressure is controlled. Continue present medications.    4.  Moderate pulmonary HTN with PASP 86mmHg on recent echo - likely Group 2 related to pulmonary venous HTN from diastolic CHF and should improve with diuresis  5.  Elevated troponin at 0.04 with flat trend (0.03>0.04>0.04) - Minimal elevation. At present she would not be a good candidate for aggressive ischemia evaluation.  For questions or updates, please contact [place] Please consult [email or web address] for contact info under Cardiology/STEMI.  Would check potassium and renal function one week after discharge. We will sign off. Please call with questions. Patient should follow-up in our office with Dr Tamala Julian or APP for cardiac issues 1-2 weeks after discharge.      Signed, Kirk Ruths, MD  05/05/2017, 12:05 PM

## 2017-05-06 LAB — BASIC METABOLIC PANEL
Anion gap: 9 (ref 5–15)
BUN: 22 mg/dL — AB (ref 6–20)
CO2: 32 mmol/L (ref 22–32)
Calcium: 8.8 mg/dL — ABNORMAL LOW (ref 8.9–10.3)
Chloride: 92 mmol/L — ABNORMAL LOW (ref 101–111)
Creatinine, Ser: 1.37 mg/dL — ABNORMAL HIGH (ref 0.44–1.00)
GFR calc Af Amer: 48 mL/min — ABNORMAL LOW (ref >60)
GFR, EST NON AFRICAN AMERICAN: 41 mL/min — AB (ref >60)
GLUCOSE: 79 mg/dL (ref 65–99)
POTASSIUM: 4.3 mmol/L (ref 3.5–5.1)
Sodium: 133 mmol/L — ABNORMAL LOW (ref 135–145)

## 2017-05-06 LAB — CBC
HEMATOCRIT: 32.8 % — AB (ref 36.0–46.0)
Hemoglobin: 10.5 g/dL — ABNORMAL LOW (ref 12.0–15.0)
MCH: 25 pg — AB (ref 26.0–34.0)
MCHC: 32 g/dL (ref 30.0–36.0)
MCV: 78.1 fL (ref 78.0–100.0)
PLATELETS: 224 10*3/uL (ref 150–400)
RBC: 4.2 MIL/uL (ref 3.87–5.11)
RDW: 18.5 % — AB (ref 11.5–15.5)
WBC: 4 10*3/uL (ref 4.0–10.5)

## 2017-05-06 LAB — GLUCOSE, CAPILLARY
GLUCOSE-CAPILLARY: 112 mg/dL — AB (ref 65–99)
GLUCOSE-CAPILLARY: 141 mg/dL — AB (ref 65–99)
Glucose-Capillary: 79 mg/dL (ref 65–99)
Glucose-Capillary: 71 mg/dL (ref 65–99)

## 2017-05-06 LAB — PROCALCITONIN: Procalcitonin: <0.1 ng/mL

## 2017-05-06 NOTE — Plan of Care (Signed)
Problem: Safety: Goal: Ability to remain free from injury will improve Outcome: Progressing Bed in low position, call light and belongings in reach, bed alarm on, hourly rounds done.

## 2017-05-06 NOTE — Progress Notes (Signed)
Patient still on oxygen via nasal cannula, down to 0.5L from 3L.

## 2017-05-06 NOTE — Progress Notes (Signed)
Family Medicine Teaching Service Daily Progress Note Intern Pager: [phone]  Patient name: Brittany [name] Medical record number: [id number] Date of birth: [birth year] Age: 61 y.o. Gender: female  Primary Care Provider: System, Pcp Not In Consultants: cardiology, PT/OT, CSW Code Status: full  Pt Overview and Major Events to Date:  10/18- admitted to [facility] with dyspnea  Assessment and Plan: Brittany Robinsonis a 61 y.o.femalepresenting with shortness of breath in the setting of recent volume overload concerning for acute CHF exacerbation. PMH is significant for frequent falls, chronic diastolic heart failure, persistent atrial fibrillation/flutter, CKD3, HTN, seizure, dementia, COPD, and CVA.  #Dyspnea- likely 2/2 CHF exacerbation, BNP elevated at 1127. However due to increased oxygen and worsening dyspnea 10/20 CT chest obtained demonstrating evidence of pneumonia. Pro-cal 0.16>0.14. WBC 6.3. -continue levaquin, day 3 of 5 -continue to monitor on telemetry -Oxygen therapy as needed, wean as able, currently on 1L [place] -Acetaminophen 650 mg q6 as needed for fever or pain -Zofran 4 mg as needed for nausea -PT/OT consulted, recommend SNF  #Diastolic CHF- weight today 85 pounds, unclear dry weight. Has had some low blood pressures, likely secondary to overdiuresing. Patient asymptomatic.  consistent problems with salt/fluid intake in the past. BNP 1127 (was 1800 on last admission). Last echo 7/18 showed 50-55%EF. Patient was on lasix prior to last admission but held after last admission due to low BP. -cardiology following -Daily weights -Monitor I's and O's  -hold additional lasix -small 250cc bolus as needed if BP low  #Acute on chronic kidney disease stage III, stable   still making urine, follows with urology. Patient is pre-dialysis. Creatinine 1.01 >1.31 today, w/ baseline of 1.5-1.98 earlier this year. -strict I/O -monitor creatinine closely  #Chronic Afib- on admission  uncontrolled, tachy. Today HR in 80's.  -Cardiology following, appreciate recs  -continue Amiodarone 200 mg BID home dose -continue metoprolol 12.5 mg BID  #AMS/Dementia-at baseline from last admission. Son legal guardian. Patient refusing SNF however per son he would like SNF at discharge - reconsulted CSW - anticipate d/c to SNF after weekend - home memantine to 5mg   - home donepezil 10  #Chronic Pancreatitis- stable -monitor closely for abdominal pain/diarrhea -rule out C.diff if patient has diarrhea -Continue home Creon  #Type 2 DM- last A1C 01/22/17 of 6.2 -continue SSI -CBGs AC/HS  #Seizure disorder- stable --Lamictal 100 mg twice a day --Keppra 700 mg twice a day  #Glaucoma- stable -Continue latanoprost drops  #Protein-calorie malnutrition- Weight Loss-possibly malnutrition or possible malignancy. -consider consult pulm to consider further workup for suscpision of malignancy from prior admission -instructed nursing to allow a few salt packets per meal if she is refusing the meal tray -continue ensure TID  FEN/GI: Heart healthy/carb modified diet, pantoprazole Prophylaxis: lovenox/aspirin 81mg   Disposition: SNF  Subjective:  Feels well this morning. Denies shortness of breath, fevers, chills, lightheadedness/dizziness. No complaints.  Objective: Temp:  [97.6 F (36.4 C)-97.7 F (36.5 C)] 97.7 F (36.5 C) (10/21 0533) Pulse Rate:  [70-94] 82 (10/21 0533) Resp:  [18-20] 18 (10/21 0533) BP: (80-96)/(56-60) 96/56 (10/21 0533) SpO2:  [93 %-100 %] 100 % (10/21 0533) Weight:  [85 lb 8.6 oz (38.8 kg)] 85 lb 8.6 oz (38.8 kg) (10/21 0533) Physical Exam: General: cachectic, sitting up in bed eating breakfast, [place] in place, in NAD Cardiovascular: RRR no MRG Respiratory: crackles L > R, no increased WOB Gastrointestinal: soft, non-tender, non-distended, +BS MSK: no edema or cyanosis Neuro: no deficits noted grossly  Laboratory:  Recent Labs Lab  05/03/17 0945  05/04/17 0609 05/05/17 0427  WBC 7.2 7.9 6.3  HGB 11.9* 10.5* 11.5*  HCT 36.3 32.3* 35.4*  PLT 178 183 182    Recent Labs Lab 05/03/17 0945 05/04/17 0609 05/05/17 0427  NA 136 132* 132*  K 3.9 4.6 3.9  CL 98* 95* 87*  CO2 24 27 33*  BUN 12 12 19   CREATININE 1.18* 1.01* 1.31*  CALCIUM 9.1 8.5* 8.9  PROT 7.2  --   --   BILITOT 1.2  --   --   ALKPHOS 140*  --   --   ALT 31  --   --   AST 55*  --   --   GLUCOSE 85 87 78    Imaging/Diagnostic Tests: Dg Chest Port 1 View  Result Date: 05/03/2017 CLINICAL DATA:  Shortness of Breath EXAM: PORTABLE CHEST 1 VIEW COMPARISON:  04/17/2017 FINDINGS: There is hyperinflation of the lungs compatible with COPD. There is cardiomegaly. Dense consolidation noted in the lungs bilaterally, slightly worsened since prior study. Small effusions again noted. IMPRESSION: Worsening dense bilateral perihilar areas of consolidation. Stable small effusions. COPD, cardiomegaly. Electronically Signed   By: Rolm Baptise M.D.   On: 05/03/2017 10:01   Ct Chest W Contrast  Result Date: 05/04/2017 CLINICAL DATA:  Shortness of breath EXAM: CT CHEST WITH CONTRAST TECHNIQUE: Multidetector CT imaging of the chest was performed during intravenous contrast administration. CONTRAST:  64mL ISOVUE-300 IOPAMIDOL (ISOVUE-300) INJECTION 61% COMPARISON:  Radiograph 05/03/2017, CT 04/17/2017, 07/21/2016 FINDINGS: Cardiovascular: Aortic atherosclerosis. No aneurysmal dilatation. Enlarged pulmonary arterial trunk, measuring up to 3.7 cm with enlarged right and left main pulmonary artery branches. Cardiomegaly with biatrial enlargement and reflux of contrast into the hepatic veins consistent with elevated right heart pressure. No significant pericardial effusion. Mediastinum/Nodes: Midline trachea. No thyroid mass. Re- demonstrated mediastinal adenopathy, for example a 9 mm lymph node anterior to the trachea. No change. Esophagus within normal limits. Lungs/Pleura:  Small to moderate right pleural effusion re- demonstrated with loculated fluid along the right pulmonary fissure, slightly decreased compared to the prior CT. Similar pattern of multifocal bilateral ground-glass densities within the upper lobes and left lower lobe. Increased ground-glass density and consolidation in the right middle lobe. More confluent consolidations now visible in the left lower lobe and left greater than right upper lobes. Upper Abdomen: No acute abnormality is visible in the upper abdomen. Atrophic pancreas with scattered calcifications. Musculoskeletal: No acute or suspicious IMPRESSION: 1. Stable to slight decreased right pleural effusion. Re- demonstrated multifocal ground-glass densities within the bilateral lungs with new or increased ground-glass density and mild consolidation in the right middle lobe and right upper lobe. More confluent consolidations now visible within both upper lobes and the left lower lobes. Findings could reflect combination of multifocal pneumonia and associated mild pulmonary edema. 2. Stable cardiomegaly with biatrial enlargement. Reflux of contrast into the hepatic veins compatible with elevated right heart pressure. Enlarged pulmonary artery consistent with pulmonary hypertension. 3. Stable mild mediastinal adenopathy. Electronically Signed   By: Donavan Foil M.D.   On: 05/04/2017 22:17    Steve Rattler, DO 05/06/2017, 7:09 AM PGY-2, [place] Intern pager: [phone], text pages welcome

## 2017-05-07 DIAGNOSIS — R0600 Dyspnea, unspecified: Secondary | ICD-10-CM

## 2017-05-07 LAB — GLUCOSE, CAPILLARY
GLUCOSE-CAPILLARY: 88 mg/dL (ref 65–99)
GLUCOSE-CAPILLARY: 95 mg/dL (ref 65–99)
Glucose-Capillary: 119 mg/dL — ABNORMAL HIGH (ref 65–99)
Glucose-Capillary: 77 mg/dL (ref 65–99)

## 2017-05-07 NOTE — Progress Notes (Signed)
Family Medicine Teaching Service Daily Progress Note Intern Pager: [phone]  Patient name: Brittany [name] Medical record number: [id number] Date of birth: 11/01/1955 Age: 61 y.o. Gender: female  Primary Care Provider: System, Pcp Not In Consultants: cardiology, PT/OT, CSW Code Status: full  Pt Overview and Major Events to Date:  10/18- admitted to [facility] with dyspnea.  Patient was refusing SNF but her son is legal guardian and caretaker and wants placement with SNF.  Assessment and Plan: Brittany Robinsonis a 61 y.o.femalepresenting with shortness of breath in the setting of recent volume overload concerning for acute CHF exacerbation. PMH is significant for frequent falls, chronic diastolic heart failure, persistent atrial fibrillation/flutter, CKD3, HTN, seizure, dementia, COPD, and CVA.  #Dyspnea- likely 2/2 CHF exacerbation, BNP elevated at 1127. However due to increased oxygen and worsening dyspnea 10/20 CT chest obtained demonstrating evidence of pneumonia. Pro-cal 0.16>0.14. WBC 6.3. -continue levaquin, day 3 of 5 -continue to monitor on telemetry -Oxygen therapy as needed, wean as able, currently on 2L [place] -Acetaminophen 650 mg q6 as needed for fever or pain -Zofran 4 mg as needed for nausea -PT/OT consulted, recommend SNF  #Diastolic CHF- weight today 85 pounds, unclear dry weight. Has had some low blood pressures, likely secondary to overdiuresing. Patient asymptomatic.  consistent problems with salt/fluid intake in the past. BNP 1127 (was 1800 on last admission). Last echo 7/18 showed 50-55%EF. Patient was on lasix prior to last admission but held after last admission due to low BP. -cardiology following -Daily weights -Monitor I's and O's  -hold additional lasix -small 250cc bolus as needed if BP low Hold metoprolol  #Acute on chronic kidney disease stage III, stable   still making urine, follows with urology. Patient is pre-dialysis. Creatinine 1.01 >1.31 today, w/  baseline of 1.5-1.98 earlier this year. -strict I/O -monitor creatinine closely  #Chronic Afib- on admission uncontrolled, tachy. Today HR in 80's.  -Cardiology following, appreciate recs  -continue Amiodarone 200 mg BID home dose -hold metoprolol due to hypotension  #AMS/Dementia-at baseline from last admission. Son legal guardian. Patient refusing SNF however per son he would like SNF at discharge - reconsulted CSW - anticipate d/c to SNF after weekend - home memantine to 5mg   - home donepezil 10  #Chronic Pancreatitis- stable -monitor closely for abdominal pain/diarrhea -rule out C.diff if patient has diarrhea -Continue home Creon  #Type 2 DM- last A1C 01/22/17 of 6.2 -continue SSI -CBGs AC/HS  #Seizure disorder- stable --Lamictal 100 mg twice a day --Keppra 700 mg twice a day  #Glaucoma- stable -Continue latanoprost drops  #Protein-calorie malnutrition- Weight Loss-possibly malnutrition or possible malignancy. -consider consult pulm to consider further workup for suscpision of malignancy from prior admission -instructed nursing to allow a few salt packets per meal if she is refusing the meal tray -continue ensure TID  FEN/GI: Heart healthy/carb modified diet, pantoprazole Prophylaxis: lovenox/aspirin 81mg   Disposition: SNF, per son  Subjective:  Patient is sad about going to snf but feels like her breathing is getting better  Objective: Temp:  [97.4 F (36.3 C)-98 F (36.7 C)] 98 F (36.7 C) (10/22 0442) Pulse Rate:  [76-90] 90 (10/22 0442) Resp:  [18] 18 (10/22 0442) BP: (90-110)/(54-75) 110/75 (10/22 0442) SpO2:  [97 %-100 %] 97 % (10/22 0442) Weight:  [85 lb 14.4 oz (39 kg)] 85 lb 14.4 oz (39 kg) (10/22 0442) Physical Exam: General: cachectic, sitting up in bed, [place] in place, in NAD Cardiovascular: RRR no MRG Respiratory: course lung sounds but no crackles, no increased WOB  Gastrointestinal: soft, non-tender, non-distended, +BS MSK: no edema or  cyanosis Neuro: no deficits noted grossly  Laboratory:  Recent Labs Lab 05/04/17 0609 05/05/17 0427 05/06/17 0702  WBC 7.9 6.3 4.0  HGB 10.5* 11.5* 10.5*  HCT 32.3* 35.4* 32.8*  PLT 183 182 224    Recent Labs Lab 05/03/17 0945 05/04/17 0609 05/05/17 0427 05/06/17 0702  NA 136 132* 132* 133*  K 3.9 4.6 3.9 4.3  CL 98* 95* 87* 92*  CO2 24 27 33* 32  BUN 12 12 19  22*  CREATININE 1.18* 1.01* 1.31* 1.37*  CALCIUM 9.1 8.5* 8.9 8.8*  PROT 7.2  --   --   --   BILITOT 1.2  --   --   --   ALKPHOS 140*  --   --   --   ALT 31  --   --   --   AST 55*  --   --   --   GLUCOSE 85 87 78 79    Imaging/Diagnostic Tests: Dg Chest Port 1 View  Result Date: 05/03/2017 CLINICAL DATA:  Shortness of Breath EXAM: PORTABLE CHEST 1 VIEW COMPARISON:  04/17/2017 FINDINGS: There is hyperinflation of the lungs compatible with COPD. There is cardiomegaly. Dense consolidation noted in the lungs bilaterally, slightly worsened since prior study. Small effusions again noted. IMPRESSION: Worsening dense bilateral perihilar areas of consolidation. Stable small effusions. COPD, cardiomegaly. Electronically Signed   By: Rolm Baptise M.D.   On: 05/03/2017 10:01   Ct Chest W Contrast  Result Date: 05/04/2017 CLINICAL DATA:  Shortness of breath EXAM: CT CHEST WITH CONTRAST TECHNIQUE: Multidetector CT imaging of the chest was performed during intravenous contrast administration. CONTRAST:  86mL ISOVUE-300 IOPAMIDOL (ISOVUE-300) INJECTION 61% COMPARISON:  Radiograph 05/03/2017, CT 04/17/2017, 07/21/2016 FINDINGS: Cardiovascular: Aortic atherosclerosis. No aneurysmal dilatation. Enlarged pulmonary arterial trunk, measuring up to 3.7 cm with enlarged right and left main pulmonary artery branches. Cardiomegaly with biatrial enlargement and reflux of contrast into the hepatic veins consistent with elevated right heart pressure. No significant pericardial effusion. Mediastinum/Nodes: Midline trachea. No thyroid mass. Re-  demonstrated mediastinal adenopathy, for example a 9 mm lymph node anterior to the trachea. No change. Esophagus within normal limits. Lungs/Pleura: Small to moderate right pleural effusion re- demonstrated with loculated fluid along the right pulmonary fissure, slightly decreased compared to the prior CT. Similar pattern of multifocal bilateral ground-glass densities within the upper lobes and left lower lobe. Increased ground-glass density and consolidation in the right middle lobe. More confluent consolidations now visible in the left lower lobe and left greater than right upper lobes. Upper Abdomen: No acute abnormality is visible in the upper abdomen. Atrophic pancreas with scattered calcifications. Musculoskeletal: No acute or suspicious IMPRESSION: 1. Stable to slight decreased right pleural effusion. Re- demonstrated multifocal ground-glass densities within the bilateral lungs with new or increased ground-glass density and mild consolidation in the right middle lobe and right upper lobe. More confluent consolidations now visible within both upper lobes and the left lower lobes. Findings could reflect combination of multifocal pneumonia and associated mild pulmonary edema. 2. Stable cardiomegaly with biatrial enlargement. Reflux of contrast into the hepatic veins compatible with elevated right heart pressure. Enlarged pulmonary artery consistent with pulmonary hypertension. 3. Stable mild mediastinal adenopathy. Electronically Signed   By: Donavan Foil M.D.   On: 05/04/2017 22:17    Sherene Sires, DO 05/07/2017, 6:51 AM PGY-2, [place] Intern pager: [phone], text pages welcome

## 2017-05-07 NOTE — Progress Notes (Signed)
Paged MD for BP of 89/65. Awaiting call back.

## 2017-05-07 NOTE — Clinical Social Work Note (Signed)
CSW was notified this morning that patient's son is her legal guardian. CSW located paperwork in chart. CSW called patient's son. He confirmed interest in SNF placement. [facility] is first preference. CSW notified admissions coordinator. FL2 completed and faxed out.  Dayton Scrape, [place]

## 2017-05-07 NOTE — NC FL2 (Signed)
[place] LEVEL OF CARE SCREENING TOOL     IDENTIFICATION  Patient Name: Brittany [name] Birthdate: October 24, 1955 Sex: female Admission Date (Current Location): 05/03/2017  [facility] and [place] Number:  Herbalist and Address:  [facility], [place] [address] [place], [place] [address]      Provider Number: [id number]  Attending Physician Name and Address:  Zenia Resides, MD  Relative Name and Phone Number:       Current Level of Care: Hospital Recommended Level of Care: [place] Prior Approval Number:    Date Approved/Denied:   PASRR Number: [id number]  Discharge Plan: SNF    Current Diagnoses: Patient Active Problem List   Diagnosis Date Noted  . Pancreatic insufficiency   . Chronic atrial fibrillation ([place])   . Severe protein-calorie malnutrition ([place]) 04/18/2017  . Sepsis ([place])   . Persistent atrial fibrillation ([place])   . Low back pain   . Lung mass   . Dyspnea   . Hypoglycemia 04/16/2017  . CHF (congestive heart failure) ([place]) 01/22/2017  . Acute renal failure (ARF) ([place]) 01/22/2017  . Atrial fibrillation with RVR ([place]) 01/22/2017  . Acute renal failure with acute tubular necrosis superimposed on stage 3 chronic kidney disease ([place])   . Dementia 08/05/2016  . CKD (chronic kidney disease) stage 3, GFR 30-59 ml/min (HCC) 08/05/2016  . Seizures ([place]) 08/05/2016  . Acute on chronic diastolic heart failure ([place]) 08/04/2016  . Malnutrition of moderate degree 07/26/2016  . Atrial flutter ([place])   . AKI (acute kidney injury) ([place])   . Cardiogenic shock ([place]) 07/21/2016  . Syncope and collapse   . Encounter for central line care   . Hypotension     Orientation RESPIRATION BLADDER Height & Weight     Self, Time, Situation, Place (Son is legal guardian)  Normal Incontinent, External catheter (At times) Weight: 85 lb 14.4 oz (39 kg) (scale c) Height:  5\' 3"  (160 cm)  BEHAVIORAL SYMPTOMS/MOOD  NEUROLOGICAL BOWEL NUTRITION STATUS  Other (Comment) (None) Convulsions/Seizures (Dementia) Continent Diet (Heart healthy/carb modified)  AMBULATORY STATUS COMMUNICATION OF NEEDS Skin   Limited Assist Verbally Other (Comment) (Excoriated)                       Personal Care Assistance Level of Assistance              Functional Limitations Info  Sight, Hearing, Speech Sight Info: Adequate Hearing Info: Adequate Speech Info: Adequate    SPECIAL CARE FACTORS FREQUENCY  PT (By licensed PT)     PT Frequency: 5 x week              Contractures Contractures Info: Not present    Additional Factors Info  Code Status, Allergies Code Status Info: Full Allergies Info: NKDA           Current Medications (05/07/2017):  This is the current hospital active medication list Current Facility-Administered Medications  Medication Dose Route Frequency Provider Last Rate Last Dose  . acetaminophen (TYLENOL) tablet 650 mg  650 mg Oral Q6H PRN Diallo, Abdoulaye, MD       Or  . acetaminophen (TYLENOL) suppository 650 mg  650 mg Rectal Q6H PRN Diallo, Abdoulaye, MD      . amiodarone (PACERONE) tablet 200 mg  200 mg Oral BID Diallo, Abdoulaye, MD   200 mg at 05/06/17 2240  . aspirin EC tablet 81 mg  81 mg Oral Daily Diallo,  Abdoulaye, MD   81 mg at 05/07/17 0848  . donepezil (ARICEPT) tablet 10 mg  10 mg Oral QHS Diallo, Abdoulaye, MD   10 mg at 05/06/17 2240  . enoxaparin (LOVENOX) injection 30 mg  30 mg Subcutaneous Q24H Diallo, Abdoulaye, MD   30 mg at 05/06/17 2238  . feeding supplement (ENSURE ENLIVE) (ENSURE ENLIVE) liquid 237 mL  237 mL Oral TID BM Diallo, Abdoulaye, MD   237 mL at 05/07/17 1000  . insulin aspart (novoLOG) injection 0-9 Units  0-9 Units Subcutaneous TID WC Diallo, Abdoulaye, MD      . lamoTRIgine (LAMICTAL) tablet 100 mg  100 mg Oral BID Diallo, Abdoulaye, MD   100 mg at 05/07/17 0849  . latanoprost (XALATAN) 0.005 % ophthalmic solution 1 drop  1 drop Both Eyes  QHS Diallo, Abdoulaye, MD   1 drop at 05/06/17 2239  . levofloxacin (LEVAQUIN) tablet 500 mg  500 mg Oral Daily Lucila Maine C, DO   500 mg at 05/07/17 0848  . lipase/protease/amylase (CREON) capsule 12,000 Units  12,000 Units Oral TID AC Diallo, Abdoulaye, MD   12,000 Units at 05/07/17 1937  . memantine (NAMENDA) tablet 5 mg  5 mg Oral BID Diallo, Abdoulaye, MD   5 mg at 05/07/17 0848  . ondansetron (ZOFRAN) tablet 4 mg  4 mg Oral Q6H PRN Diallo, Abdoulaye, MD       Or  . ondansetron (ZOFRAN) injection 4 mg  4 mg Intravenous Q6H PRN Diallo, Abdoulaye, MD         Discharge Medications: Please see discharge summary for a list of discharge medications.  Relevant Imaging Results:  Relevant Lab Results:   Additional Information SS#: [id number]. Son is legal guardian.  Candie Chroman, LCSW

## 2017-05-07 NOTE — Progress Notes (Signed)
Physical Therapy Treatment Patient Details Name: Brittany [name] MRN: [id number] DOB: [birth year] Today's Date: 05/07/2017    History of Present Illness Brittany [name] is a 61 y.o. female presenting with shortness of breath in the setting of recent volume overload concerning for acute CHF exacerbation. PMH is significant for frequent falls, chronic diastolic heart failure, persistent atrial fibrillation/flutter, CKD3, HTN, seizure, dementia, COPD, and CVA.    PT Comments    Patient is making progress toward mobility goals. Pt continues to demonstrate decreased activity tolerance and balance deficits increasing risk for falls. Continue to progress as tolerated with anticipated d/c to SNF for further skilled PT services.     Follow Up Recommendations  SNF;Supervision/Assistance - 24 hour     Equipment Recommendations  Other (comment) (TBA)    Recommendations for Other Services       Precautions / Restrictions Precautions Precautions: Fall Restrictions Weight Bearing Restrictions: No    Mobility  Bed Mobility Overal bed mobility: Needs Assistance Bed Mobility: Supine to Sit     Supine to sit: Min guard     General bed mobility comments: min guard for safety; increased time and effort and use of rail with HOB elevated  Transfers Overall transfer level: Needs assistance Equipment used: Straight cane Transfers: Sit to/from Stand Sit to Stand: Min assist;Mod assist         General transfer comment: assist to power up into standing and to gain balance upon standing; use of SPC upon standing  Ambulation/Gait Ambulation/Gait assistance: +2 safety/equipment;Mod assist;Min assist (chair follow) Ambulation Distance (Feet):  (19ft with SPC and 55ft X2) Assistive device: Rolling walker (2 wheeled);Straight cane Gait Pattern/deviations: Step-to pattern;Decreased step length - right;Staggering left;Staggering right;Drifts right/left;Trunk flexed;Decreased stance time -  left;Decreased stride length;Decreased weight shift to left Gait velocity: decreased   General Gait Details: gait attempted with SPC and HHA intially however pt was significantly unsteady; ambulated the rest of the way with RW and min/mod A for balance and mangement of RW; cues for proximity of RW and bilat step lengths; leg length discrepency   Stairs            Wheelchair Mobility    Modified Rankin (Stroke Patients Only)       Balance Overall balance assessment: Needs assistance Sitting-balance support: No upper extremity supported;Feet supported Sitting balance-Leahy Scale: Fair Sitting balance - Comments: sitting EOB   Standing balance support: Bilateral upper extremity supported;During functional activity Standing balance-Leahy Scale: Poor Standing balance comment: pt with ataxic trunk movements in standing                            Cognition Arousal/Alertness: Awake/alert Behavior During Therapy: WFL for tasks assessed/performed Overall Cognitive Status: History of cognitive impairments - at baseline                                 General Comments: dementia      Exercises      General Comments General comments (skin integrity, edema, etc.): VSS on RA      Pertinent Vitals/Pain Pain Assessment: Faces Faces Pain Scale: Hurts a little bit Pain Location: L posterior hip Pain Descriptors / Indicators: Sore Pain Intervention(s): Limited activity within patient's tolerance;Monitored during session;Repositioned    Home Living                      Prior Function  PT Goals (current goals can now be found in the care plan section) Acute Rehab PT Goals PT Goal Formulation: With patient Time For Goal Achievement: 05/18/17 Potential to Achieve Goals: Good Progress towards PT goals: Progressing toward goals    Frequency    Min 2X/week      PT Plan Current plan remains appropriate    Co-evaluation               AM-PAC PT "6 Clicks" Daily Activity  Outcome Measure  Difficulty turning over in bed (including adjusting bedclothes, sheets and blankets)?: A Little Difficulty moving from lying on back to sitting on the side of the bed? : A Little Difficulty sitting down on and standing up from a chair with arms (e.g., wheelchair, bedside commode, etc,.)?: Unable Help needed moving to and from a bed to chair (including a wheelchair)?: A Lot Help needed walking in hospital room?: A Lot Help needed climbing 3-5 steps with a railing? : Total 6 Click Score: 12    End of Session Equipment Utilized During Treatment: Gait belt Activity Tolerance: Patient tolerated treatment well Patient left: in chair;with call bell/phone within reach;with chair alarm set Nurse Communication: Mobility status PT Visit Diagnosis: Unsteadiness on feet (R26.81)     Time: 4709-6283 PT Time Calculation (min) (ACUTE ONLY): 24 min  Charges:  $Gait Training: 8-22 mins $Therapeutic Activity: 8-22 mins                    G Codes:       Earney Navy, PTA Pager: [phone]     Darliss Cheney 05/07/2017, 4:19 PM

## 2017-05-07 NOTE — Clinical Social Work Placement (Signed)
   CLINICAL SOCIAL WORK PLACEMENT  NOTE  Date:  05/07/2017  Patient Details  Name: Brittany [name] MRN: [id number] Date of Birth: April 04, 1956  Clinical Social Work is seeking post-discharge placement for this patient at the [place] level of care (*CSW will initial, date and re-position this form in  chart as items are completed):  Yes   Patient/family provided with [place] Work Department's list of facilities offering this level of care within the geographic area requested by the patient (or if unable, by the patient's family).  Yes   Patient/family informed of their freedom to choose among providers that offer the needed level of care, that participate in Medicare, Medicaid or managed care program needed by the patient, have an available bed and are willing to accept the patient.  Yes   Patient/family informed of [place] [facility]'s ownership interest in [facility] and [facility], as well as of the fact that they are under no obligation to receive care at these facilities.  PASRR submitted to EDS on 05/07/17     PASRR number received on       Existing PASRR number confirmed on 05/07/17     FL2 transmitted to all facilities in geographic area requested by pt/family on 05/07/17     FL2 transmitted to all facilities within larger geographic area on       Patient informed that his/her managed care company has contracts with or will negotiate with certain facilities, including the following:            Patient/family informed of bed offers received.  Patient chooses bed at       Physician recommends and patient chooses bed at      Patient to be transferred to   on  .  Patient to be transferred to facility by       Patient family notified on   of transfer.  Name of family member notified:        PHYSICIAN Please sign FL2     Additional Comment:    _______________________________________________ Candie Chroman, LCSW 05/07/2017,  10:03 AM

## 2017-05-07 NOTE — Care Management Important Message (Signed)
Important Message  Patient Details  Name: Brittany [name] MRN: [id number] Date of Birth: 1956/04/23   Medicare Important Message Given:  Yes    [name] 05/07/2017, 12:51 PM

## 2017-05-07 NOTE — Progress Notes (Signed)
Pt refused blood to be drawn this morning. She was educated on the importance of monitoring the blood work but she insisted that nobody will stick her with needle and that she is ready to leave this hospital. Alert but intermittent forgetfulness. Will continue to encourage her.

## 2017-05-08 LAB — GLUCOSE, CAPILLARY
GLUCOSE-CAPILLARY: 72 mg/dL (ref 65–99)
GLUCOSE-CAPILLARY: 83 mg/dL (ref 65–99)
Glucose-Capillary: 154 mg/dL — ABNORMAL HIGH (ref 65–99)

## 2017-05-08 LAB — BASIC METABOLIC PANEL
Anion gap: 11 (ref 5–15)
BUN: 19 mg/dL (ref 6–20)
CHLORIDE: 96 mmol/L — AB (ref 101–111)
CO2: 26 mmol/L (ref 22–32)
CREATININE: 1.22 mg/dL — AB (ref 0.44–1.00)
Calcium: 9 mg/dL (ref 8.9–10.3)
GFR calc Af Amer: 55 mL/min — ABNORMAL LOW (ref >60)
GFR calc non Af Amer: 48 mL/min — ABNORMAL LOW (ref >60)
GLUCOSE: 75 mg/dL (ref 65–99)
POTASSIUM: 4.6 mmol/L (ref 3.5–5.1)
SODIUM: 133 mmol/L — AB (ref 135–145)

## 2017-05-08 MED ORDER — LEVOFLOXACIN 500 MG PO TABS: 500.0000 mg | ORAL_TABLET | Freq: Every day | ORAL | 0 refills | Status: AC

## 2017-05-08 NOTE — Progress Notes (Signed)
Family Medicine Teaching Service Daily Progress Note Intern Pager: [phone]  Patient name: Brittany [name] Medical record number: [id number] Date of birth: [birth year] Age: 61 y.o. Gender: female  Primary Care Provider: System, Pcp Not In Consultants: Cardiology Code Status: Full   Pt Overview and Major Events to Date:  10/18- admitted to Echo with dyspnea  Assessment and Plan: Brittany Robinsonis a 61 y.o.femalepresenting with shortness of breathin the setting of recent volume overload concerning for acute CHF exacerbation. PMH is significant for frequent falls, chronic diastolic heart failure, persistent atrial fibrillation/flutter, CKD3, HTN, seizure, dementia, COPD, and CVA.  Dyspnea likely 2/2 CHF exacerbation, BNP elevated at 1127 on 10/18. CT chest on 10/19 obtained demonstrating evidence of pneumonia and mild pulmonary edema. Pro-cal 0.16>0.14><0.10. WBC 4.0 on 10/21. Complains of mild SOB today, O2 sat of 100% on room air.  -continue levaquin, day 4 of 5 -continue to monitor on telemetry -Oxygen therapy as needed, wean as able, currently on RA -Acetaminophen 650 mg q6 as needed for fever or pain -Zofran 4 mg as needed for nausea -PT/OT consulted, recommend SNF -monitor O2 when ambulating   Diastolic CHF weight today 86 pounds, unclear dry weight. Has had some low blood pressures, likely secondary to overdiuresing. Patient asymptomatic. Patient has history of increased salt/fluid intake. BNP 1127 on admission (was 1800 on last admission). Last echo 7/18 showed 50-55%EF. Patient was on lasix prior to last admissionbut held after last admission due to low BP. -cardiology following, appreciate recommendations. Has signed off with recommendations of monitoring K and renal function 1 week after discharge.  -Daily weights -Monitor I's and O's  -hold additional lasix -small 250cc bolus as needed if BP low -Hold metoprolol  Acute on chronic kidney disease stage III,  stable still making urine, follows with urology. Patient is pre-dialysis. Creatinine of 1.22 today, w/ baseline of 1.5-1.98 earlier this year. -strict I/O -monitor creatinine closely  Chronic Afib on admission uncontrolled, tachy. Today HR of 77 -Cardiology following, appreciate recs -continue Amiodarone 200 mg BID home dose -hold metoprolol due to hypotension  AMS/Dementia at baseline from last admission. Son legal guardian. Patient refusing SNF however per son he would like SNF at discharge - reconsulted CSW, appreciate recommendations  - home memantine to 5mg   - home donepezil 10  Chronic Pancreatitis- stable Patient today denies abdominal pain. CT chest on 10/19 showed atrophic pancreas with scattered calcifications.  -monitor closely for abdominal pain/diarrhea -Continue home Creon  Type 2 DM last A1C 01/22/17 of 6.2. Most recent CBG of 72.  -continue SSI -CBGs AC/HS  Seizure disorder- stable -Lamictal 100 mg twice a day -Keppra 700 mg twice a day  Glaucoma- stable -Continue latanoprost drops  Protein-calorie malnutrition- Weight Loss possibly malnutrition or possible malignancy. -consider outpatient consult pulm to consider further workup for suscpision of malignancy from prior admission -instructed nursing to allow a few salt packets per meal if she is refusing the meal tray -continue ensure TID  FEN/GI: heart health/carb modified diet, pantoprazole PPx: lovenox, ASA 81 mg  Disposition: SNF  Subjective:  Patient today endorses SOB. Denies CP or abdominal pain. Appears to be in no distress.   Objective: Temp:  [97.9 F (36.6 C)-98.1 F (36.7 C)] 98.1 F (36.7 C) (10/23 1136) Pulse Rate:  [89-102] 89 (10/23 1136) Resp:  [18] 18 (10/23 1136) BP: (99-117)/(72-89) 108/89 (10/23 1136) SpO2:  [99 %-100 %] 100 % (10/23 1136) Weight:  [86 lb 14.4 oz (39.4 kg)] 86 lb 14.4 oz (39.4 kg) (10/23 0434) Physical  Exam: General: awake and alert, NAD, laying in  bed  Cardiovascular: RRR, no MRG Respiratory: slight crackles in bases bilaterally, no wheezes, no increased work of breathing, no retractions  Abdomen: soft, non tender, non distended, bowel sounds normal  Extremities: no edema, non tender   Laboratory:  Recent Labs Lab 05/04/17 0609 05/05/17 0427 05/06/17 0702  WBC 7.9 6.3 4.0  HGB 10.5* 11.5* 10.5*  HCT 32.3* 35.4* 32.8*  PLT 183 182 224    Recent Labs Lab 05/03/17 0945  05/05/17 0427 05/06/17 0702 05/08/17 0520  NA 136  < > 132* 133* 133*  K 3.9  < > 3.9 4.3 4.6  CL 98*  < > 87* 92* 96*  CO2 24  < > 33* 32 26  BUN 12  < > 19 22* 19  CREATININE 1.18*  < > 1.31* 1.37* 1.22*  CALCIUM 9.1  < > 8.9 8.8* 9.0  PROT 7.2  --   --   --   --   BILITOT 1.2  --   --   --   --   ALKPHOS 140*  --   --   --   --   ALT 31  --   --   --   --   AST 55*  --   --   --   --   GLUCOSE 85  < > 78 79 75  < > = values in this interval not displayed.  Imaging/Diagnostic Tests: Dg Lumbar Spine 2-3 Views  Result Date: 04/16/2017 CLINICAL DATA:  Low back pain EXAM: LUMBAR SPINE - 2-3 VIEW COMPARISON:  CT 711 1,018 FINDINGS: Minimal scoliosis. Coarse right upper quadrant calcification may correspond to a liver calcification. Lumbar alignment within normal limits. Chronic superior endplate deformity at L2. Degenerative changes at L5-S1. Remaining vertebral body heights are normal. Atherosclerotic calcifications of the aorta. Calcifications anterior to the spine, likely correspond to the pancreatic calcifications noted on the comparison CT. IMPRESSION: 1. Chronic superior endplate deformity at L2. Moderate degenerative changes at L5-S1 2. No acute osseous abnormality Electronically Signed   By: Donavan Foil M.D.   On: 04/16/2017 17:14   Ct Chest Wo Contrast  Result Date: 04/17/2017 CLINICAL DATA:  Hypoglycemia, weakness atrial fibrillation EXAM: CT CHEST WITHOUT CONTRAST TECHNIQUE: Multidetector CT imaging of the chest was performed following the  standard protocol without IV contrast. COMPARISON:  Radiograph 04/17/2017,, 02/22/2017, 07/21/2016 CT abdomen pelvis 01/24/2017 FINDINGS: Cardiovascular: Limited evaluation without intravenous contrast. Nonaneurysmal aorta. Mild atherosclerotic calcifications. Enlarged pulmonary trunk, measuring 3.8 cm with enlarged appearing right and left main pulmonary arteries. Cardiomegaly with biatrial enlargement. No significant pericardial effusion Mediastinum/Nodes: Mediastinal lymph nodes, slightly enlarged, measuring up to 9 mm in the precarinal space. Midline trachea. No thyroid mass. Esophagus unremarkable Lungs/Pleura: Small to moderate right pleural effusion with loculated fluid along the right pulmonary fissure. Multifocal ground-glass densities and mild consolidations within the bilateral lower lobes and bilateral upper lobes with relative sparing of the right middle lobe. Mild underlying emphysema. Negative for a pneumothorax. Upper Abdomen: Marked atrophy of the pancreas. No acute abnormality. Musculoskeletal: No chest wall mass or suspicious bone lesions identified. IMPRESSION: 1. Cardiomegaly with biatrial enlargement. Enlarged pulmonary arteries, suggesting pulmonary hypertension 2. Moderate bilateral ground-glass densities and scattered areas of consolidation which could reflect multifocal pneumonia or unusual appearance of pulmonary edema. Underlying mild emphysema. 3. Moderate right pleural effusion with loculation along the pulmonary fissure. 4. Mild mediastinal adenopathy, possibly reactive Aortic Atherosclerosis (ICD10-I70.0) and Emphysema (ICD10-J43.9). Electronically Signed  By: Donavan Foil M.D.   On: 04/17/2017 23:18   Ct Chest W Contrast  Result Date: 05/04/2017 CLINICAL DATA:  Shortness of breath EXAM: CT CHEST WITH CONTRAST TECHNIQUE: Multidetector CT imaging of the chest was performed during intravenous contrast administration. CONTRAST:  22mL ISOVUE-300 IOPAMIDOL (ISOVUE-300) INJECTION 61%  COMPARISON:  Radiograph 05/03/2017, CT 04/17/2017, 07/21/2016 FINDINGS: Cardiovascular: Aortic atherosclerosis. No aneurysmal dilatation. Enlarged pulmonary arterial trunk, measuring up to 3.7 cm with enlarged right and left main pulmonary artery branches. Cardiomegaly with biatrial enlargement and reflux of contrast into the hepatic veins consistent with elevated right heart pressure. No significant pericardial effusion. Mediastinum/Nodes: Midline trachea. No thyroid mass. Re- demonstrated mediastinal adenopathy, for example a 9 mm lymph node anterior to the trachea. No change. Esophagus within normal limits. Lungs/Pleura: Small to moderate right pleural effusion re- demonstrated with loculated fluid along the right pulmonary fissure, slightly decreased compared to the prior CT. Similar pattern of multifocal bilateral ground-glass densities within the upper lobes and left lower lobe. Increased ground-glass density and consolidation in the right middle lobe. More confluent consolidations now visible in the left lower lobe and left greater than right upper lobes. Upper Abdomen: No acute abnormality is visible in the upper abdomen. Atrophic pancreas with scattered calcifications. Musculoskeletal: No acute or suspicious IMPRESSION: 1. Stable to slight decreased right pleural effusion. Re- demonstrated multifocal ground-glass densities within the bilateral lungs with new or increased ground-glass density and mild consolidation in the right middle lobe and right upper lobe. More confluent consolidations now visible within both upper lobes and the left lower lobes. Findings could reflect combination of multifocal pneumonia and associated mild pulmonary edema. 2. Stable cardiomegaly with biatrial enlargement. Reflux of contrast into the hepatic veins compatible with elevated right heart pressure. Enlarged pulmonary artery consistent with pulmonary hypertension. 3. Stable mild mediastinal adenopathy. Electronically Signed    By: Donavan Foil M.D.   On: 05/04/2017 22:17   Dg Chest Port 1 View  Result Date: 05/03/2017 CLINICAL DATA:  Shortness of Breath EXAM: PORTABLE CHEST 1 VIEW COMPARISON:  04/17/2017 FINDINGS: There is hyperinflation of the lungs compatible with COPD. There is cardiomegaly. Dense consolidation noted in the lungs bilaterally, slightly worsened since prior study. Small effusions again noted. IMPRESSION: Worsening dense bilateral perihilar areas of consolidation. Stable small effusions. COPD, cardiomegaly. Electronically Signed   By: Rolm Baptise M.D.   On: 05/03/2017 10:01   Portable Chest 1 View  Result Date: 04/17/2017 CLINICAL DATA:  Dyspnea EXAM: PORTABLE CHEST 1 VIEW COMPARISON:  04/16/2017 FINDINGS: Cardiac enlargement. Bilateral perihilar airspace disease is increasing since previous study suggesting increasing pneumonia or edema. Small bilateral pleural effusions also may be increasing. No pneumothorax. Tortuous aorta. Thoracolumbar scoliosis. IMPRESSION: Cardiac enlargement peer at progressing bilateral perihilar airspace disease and small bilateral pleural effusions. Electronically Signed   By: Lucienne Capers M.D.   On: 04/17/2017 02:44   Dg Chest Port 1 View  Result Date: 04/16/2017 CLINICAL DATA:  Shortness of breath. EXAM: PORTABLE CHEST 1 VIEW COMPARISON:  Chest x-ray dated March 23, 2017. FINDINGS: Stable cardiomegaly. The lungs remain hyperinflated. Unchanged opacity in the right mid and lower lung with adjacent layering pleural fluid in the fissures. No pneumothorax. No acute osseous abnormality. IMPRESSION: 1. Unchanged right mid and lower lung airspace opacity and adjacent pleural effusion. 2. Stable cardiomegaly. Electronically Signed   By: Titus Dubin M.D.   On: 04/16/2017 11:07   Dg Swallowing Func-speech Pathology  Result Date: 04/19/2017 Objective Swallowing Evaluation: Type of Study: MBS-Modified  Barium Swallow Study Patient Details Name: Zafiro Routson MRN: [id number]  Date of Birth: [birth year] Today's Date: 04/19/2017 Time: SLP Start Time (ACUTE ONLY): 1256-SLP Stop Time (ACUTE ONLY): 1310 SLP Time Calculation (min) (ACUTE ONLY): 14 min Past Medical History: Past Medical History: Diagnosis Date . Atrial flutter ([place])  . COPD (chronic obstructive pulmonary disease) ([place])  . CVA (cerebral vascular accident) ([place])  . 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 Diastolic heart failure ([place])  . HTN (hypertension)  . PAD (peripheral artery disease) ([place])  . Pancreatitis  . Seizures ([place])  Past Surgical History: Past Surgical History: Procedure Laterality Date . CENTRAL LINE INSERTION  07/21/2016    . SHOULDER SURGERY Left  HPI: Pt is a 61 y/o F with dementia, followed at [facility], former polysubstance abuse, heavy alcohol (quit in 2008) who presented with weakness in the setting of hypoglycemia. She was also found to have hypotension and hypothermia. Initial chest x-ray was concerning for a right-sided airspace disease. Subsequent CT with complex findings including: bilateral ground glass opacities, traction bronchiectasis, nodular airspace disease, emphysema, loculated pleural effusion on the R and mediastinal lymphadenopathy. MBS was ordered to assess for potential for chronic aspiration. Subjective: pt alert, pleasant, denies trouble swallowing Assessment / Plan / Recommendation CHL IP CLINICAL IMPRESSIONS 04/19/2017 Clinical Impression Pt's oropharyngeal swallow is WFL with no aspiration observed despite challenging with multiple large, sequential straw sips of thin liquids and large solid boluses without dentures in place. There could be the potential for her to have episodic aspiration in the setting of dyspnea and/or given her cognitive status, but this could not be elicited during testing today. Recommend that she continue with a regular diet and thin liquids. No additional SLP f/u indicated. SLP Visit Diagnosis Dysphagia, unspecified (R13.10) Attention and concentration deficit following --  Frontal lobe and executive function deficit following -- Impact on safety and function Mild aspiration risk   CHL IP TREATMENT RECOMMENDATION 04/19/2017 Treatment Recommendations No treatment recommended at this time   No flowsheet data found. CHL IP DIET RECOMMENDATION 04/19/2017 SLP Diet Recommendations Regular solids;Thin liquid Liquid Administration via Cup;Straw Medication Administration Whole meds with liquid Compensations Slow rate;Small sips/bites Postural Changes Seated upright at 90 degrees   CHL IP OTHER RECOMMENDATIONS 04/19/2017 Recommended Consults -- Oral Care Recommendations Oral care QID Other Recommendations --   CHL IP FOLLOW UP RECOMMENDATIONS 04/19/2017 Follow up Recommendations 24 hour supervision/assistance   No flowsheet data found.     CHL IP ORAL PHASE 04/19/2017 Oral Phase WFL Oral - Pudding Teaspoon -- Oral - Pudding Cup -- Oral - Honey Teaspoon -- Oral - Honey Cup -- Oral - Nectar Teaspoon -- Oral - Nectar Cup -- Oral - Nectar Straw -- Oral - Thin Teaspoon -- Oral - Thin Cup -- Oral - Thin Straw -- Oral - Puree -- Oral - Mech Soft -- Oral - Regular -- Oral - Multi-Consistency -- Oral - Pill -- Oral Phase - Comment --  CHL IP PHARYNGEAL PHASE 04/19/2017 Pharyngeal Phase WFL Pharyngeal- Pudding Teaspoon -- Pharyngeal -- Pharyngeal- Pudding Cup -- Pharyngeal -- Pharyngeal- Honey Teaspoon -- Pharyngeal -- Pharyngeal- Honey Cup -- Pharyngeal -- Pharyngeal- Nectar Teaspoon -- Pharyngeal -- Pharyngeal- Nectar Cup -- Pharyngeal -- Pharyngeal- Nectar Straw -- Pharyngeal -- Pharyngeal- Thin Teaspoon -- Pharyngeal -- Pharyngeal- Thin Cup -- Pharyngeal -- Pharyngeal- Thin Straw -- Pharyngeal -- Pharyngeal- Puree -- Pharyngeal -- Pharyngeal- Mechanical Soft -- Pharyngeal -- Pharyngeal- Regular -- Pharyngeal -- Pharyngeal- Multi-consistency -- Pharyngeal -- Pharyngeal- Pill -- Pharyngeal --  Pharyngeal Comment --  CHL IP CERVICAL ESOPHAGEAL PHASE 04/19/2017 Cervical Esophageal Phase WFL Pudding Teaspoon --  Pudding Cup -- Honey Teaspoon -- Honey Cup -- Nectar Teaspoon -- Nectar Cup -- Nectar Straw -- Thin Teaspoon -- Thin Cup -- Thin Straw -- Puree -- Mechanical Soft -- Regular -- Multi-consistency -- Pill -- Cervical Esophageal Comment -- No flowsheet data found. Germain Osgood 04/19/2017, 2:52 PM  Germain Osgood, M.A. CCC-SLP [phone]               Caroline More, DO 05/08/2017, 12:23 PM PGY-1, [place] Intern pager: [phone], text pages welcome

## 2017-05-08 NOTE — Clinical Social Work Placement (Signed)
   CLINICAL SOCIAL WORK PLACEMENT  NOTE  Date:  05/08/2017  Patient Details  Name: Brittany [name] MRN: [id number] Date of Birth: [birth year]  Clinical Social Work is seeking post-discharge placement for this patient at the [place] level of care (*CSW will initial, date and re-position this form in  chart as items are completed):  Yes   Patient/family provided with [place] Work Department's list of facilities offering this level of care within the geographic area requested by the patient (or if unable, by the patient's family).  Yes   Patient/family informed of their freedom to choose among providers that offer the needed level of care, that participate in Medicare, Medicaid or managed care program needed by the patient, have an available bed and are willing to accept the patient.  Yes   Patient/family informed of [facility]'s ownership interest in [facility], as well as of the fact that they are under no obligation to receive care at these facilities.  PASRR submitted to EDS on 05/07/17     PASRR number received on       Existing PASRR number confirmed on 05/07/17     FL2 transmitted to all facilities in geographic area requested by pt/family on 05/07/17     FL2 transmitted to all facilities within larger geographic area on       Patient informed that his/her managed care company has contracts with or will negotiate with certain facilities, including the following:        Yes   Patient/family informed of bed offers received.  Patient chooses bed at [facility]     Physician recommends and patient chooses bed at      Patient to be transferred to [facility] on 05/08/17.  Patient to be transferred to facility by [facility]     Patient family notified on 05/08/17 of transfer.  Name of family member notified:  [facility]     PHYSICIAN Please prepare prescriptions     Additional  Comment:    _______________________________________________ Candie Chroman, LCSW 05/08/2017, 4:07 PM

## 2017-05-08 NOTE — Clinical Social Work Note (Addendum)
MD note from yesterday states patient should be stable for discharge today. Patient's son notified. He chooses [facility]. Admissions coordinator will check to see if a bed is available today.  Dayton Scrape, [address]  12:28 pm Left voicemail for son. Admissions coordinator wants him to call her to set up a time to do paperwork.  Dayton Scrape, CSW [phone]  1:49 pm Patient has a bed at [facility] today. Patient's daughter will go to facility to complete paperwork. MD notified.  Dayton Scrape, [place]

## 2017-05-08 NOTE — Progress Notes (Signed)
SATURATION QUALIFICATIONS: (This note is used to comply with regulatory documentation for home oxygen)  Patient Saturations on Room Air at Rest = 98%  Patient Saturations on Room Air while Ambulating = 89%  Patient Saturations on 1 Liters of oxygen while Ambulating = 99%  Please briefly explain why patient needs home oxygen:

## 2017-05-08 NOTE — Clinical Social Work Note (Signed)
CSW facilitated patient discharge including contacting patient family and facility to confirm patient discharge plans. Clinical information faxed to facility and family agreeable with plan. CSW arranged ambulance transport via [facility] to [facility]. RN to call report prior to discharge [phone]).  CSW will sign off for now as social work intervention is no longer needed. Please consult [place] again if new needs arise.  Dayton Scrape, [place]

## 2017-05-09 ENCOUNTER — Other Ambulatory Visit: Payer: Self-pay | Admitting: Family Medicine

## 2017-05-09 DIAGNOSIS — R9389 Abnormal findings on diagnostic imaging of other specified body structures: Secondary | ICD-10-CM

## 2017-05-23 ENCOUNTER — Other Ambulatory Visit: Payer: Medicare (Managed Care)

## 2017-05-28 ENCOUNTER — Inpatient Hospital Stay: Payer: Medicare (Managed Care) | Admitting: Pulmonary Disease

## 2017-05-30 ENCOUNTER — Inpatient Hospital Stay: Payer: Medicare (Managed Care) | Admitting: Pulmonary Disease

## 2017-06-04 ENCOUNTER — Ambulatory Visit: Payer: Medicare (Managed Care) | Admitting: Nurse Practitioner

## 2017-06-04 NOTE — Progress Notes (Deleted)
CARDIOLOGY OFFICE NOTE  Date:  06/04/2017    [name] Date of Birth: 1956-05-10 Medical Record #[id number]  PCP:  System, Pcp Not In  Cardiologist:  Servando Snare & ***    No chief complaint on file.   History of Present Illness: Brittany [name] is a 61 y.o. female who presents today for a ***   Comes in today. Here with   Past Medical History:  Diagnosis Date  . Atrial flutter ([place])   . COPD (chronic obstructive pulmonary disease) ([place])   . CVA (cerebral vascular accident) ([place])   . 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 Diastolic heart failure ([place])   . HTN (hypertension)   . PAD (peripheral artery disease) ([place])   . Pancreatitis   . Seizures ([place])     Past Surgical History:  Procedure Laterality Date  . CENTRAL LINE INSERTION  07/21/2016      . SHOULDER SURGERY Left      Medications: No outpatient medications have been marked as taking for the 06/04/17 encounter (Appointment) with Burtis Junes, NP.     Allergies: No Known Allergies  Social History: The patient  reports that she has quit smoking. she has never used smokeless tobacco. She reports that she does not drink alcohol or use drugs.   Family History: The patient's ***family history includes Cancer in her child and mother; Cerebrovascular Accident in her child; Gout in her child; Hypertension in her father.   Review of Systems: Please see the history of present illness.   Otherwise, the review of systems is positive for {NONE DEFAULTED:18576::"none"}.   All other systems are reviewed and negative.   Physical Exam: VS:  There were no vitals taken for this visit. Marland Kitchen  BMI There is no height or weight on file to calculate BMI.  Wt Readings from Last 3 Encounters:  05/08/17 86 lb 14.4 oz (39.4 kg)  04/20/17 102 lb (46.3 kg)  02/22/17 100 lb (45.4 kg)    General: Pleasant. Well developed, well nourished and in no acute distress.   HEENT: Normal.  Neck: Supple, no JVD, carotid bruits, or  masses noted.  Cardiac: ***Regular rate and rhythm. No murmurs, rubs, or gallops. No edema.  Respiratory:  Lungs are clear to auscultation bilaterally with normal work of breathing.  GI: Soft and nontender.  MS: No deformity or atrophy. Gait and ROM intact.  Skin: Warm and dry. Color is normal.  Neuro:  Strength and sensation are intact and no gross focal deficits noted.  Psych: Alert, appropriate and with normal affect.   LABORATORY DATA:  EKG:  EKG {ACTION; IS/IS IZT:[id number]} ordered today. This demonstrates ***.  Lab Results  Component Value Date   WBC 4.0 05/06/2017   HGB 10.5 (L) 05/06/2017   HCT 32.8 (L) 05/06/2017   PLT 224 05/06/2017   GLUCOSE 75 05/08/2017   CHOL 76 01/25/2017   TRIG 56 01/25/2017   HDL 46 01/25/2017   LDLCALC 19 01/25/2017   ALT 31 05/03/2017   AST 55 (H) 05/03/2017   NA 133 (L) 05/08/2017   K 4.6 05/08/2017   CL 96 (L) 05/08/2017   CREATININE 1.22 (H) 05/08/2017   BUN 19 05/08/2017   CO2 26 05/08/2017   TSH 3.995 04/19/2017   INR 1.33 04/17/2017   HGBA1C 6.2 (H) 01/22/2017     BNP (last 3 results) Recent Labs    01/22/17 0335 02/22/17 1253 05/03/17 0945  BNP 1,507.3* 1,821.3* 1,127.0*    ProBNP (last  3 results) Recent Labs    10/11/16 1033  PROBNP 8,862*     Other Studies Reviewed Today:   Assessment/Plan:   Current medicines are reviewed with the patient today.  The patient does not have concerns regarding medicines other than what has been noted above.  The following changes have been made:  See above.  Labs/ tests ordered today include:   No orders of the defined types were placed in this encounter.    Disposition:   FU with *** in {gen number [id number]:[id number]} {Days to years:10300}.   Patient is agreeable to this plan and will call if any problems develop in the interim.   SignedTruitt Merle, NP  06/04/2017 7:29 AM  [facility] [address] [place], [place]   [address] Phone: [phone] Fax: [phone]

## 2017-06-06 ENCOUNTER — Other Ambulatory Visit: Payer: No Typology Code available for payment source

## 2017-06-06 ENCOUNTER — Ambulatory Visit: Payer: Medicare (Managed Care) | Admitting: Nurse Practitioner

## 2017-06-16 DEATH — deceased

## 2018-03-04 IMAGING — CT CT HEAD W/O CM
4 series · 18 of 47 positions shown, 20 images · non-contrast
Comparison: None.

CLINICAL DATA: Syncope, hypotensive and septic.  No headache.

EXAM:
CT HEAD WITHOUT CONTRAST
TECHNIQUE: Contiguous axial images were obtained from the base of the skull
through the vertex without intravenous contrast.

[Series 201: head w/o, idose (1) · axial · non-contrast · 0.45mm/px · z∈[+64,+184]mm · 8 of 32 slices shown, 10 images]
[im 4/32  brain]
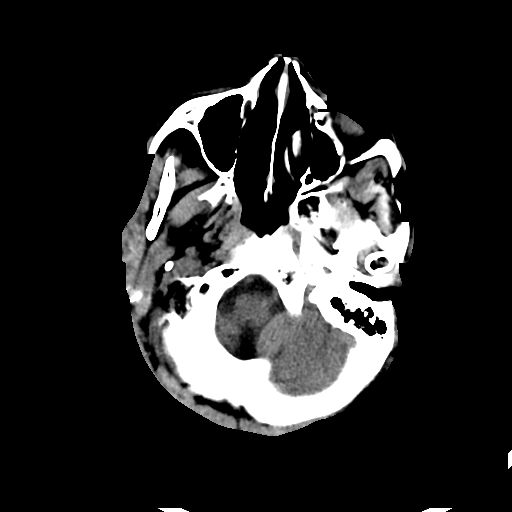
[im 4/32  bone]
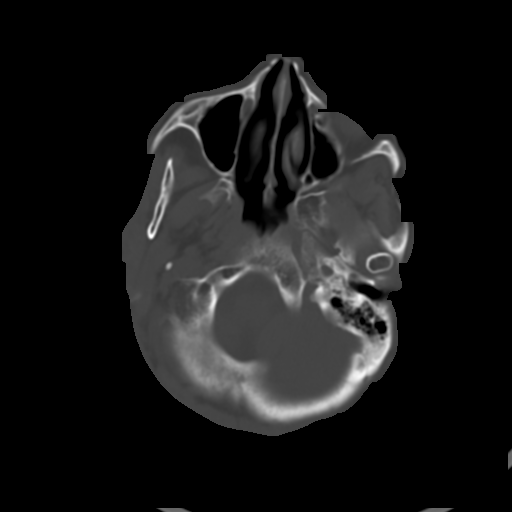
[im 7/32  brain]
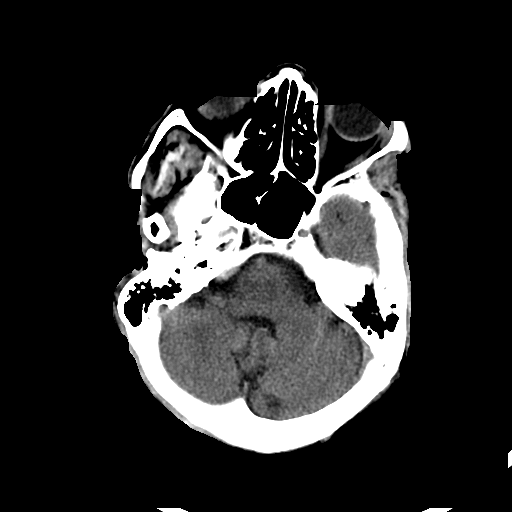
[im 11/32  brain]
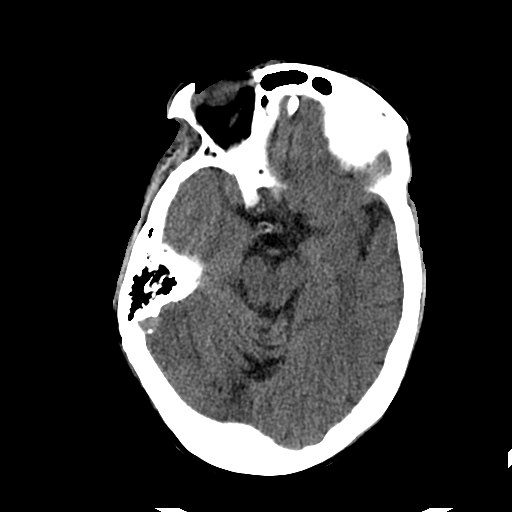
[im 14/32  brain]
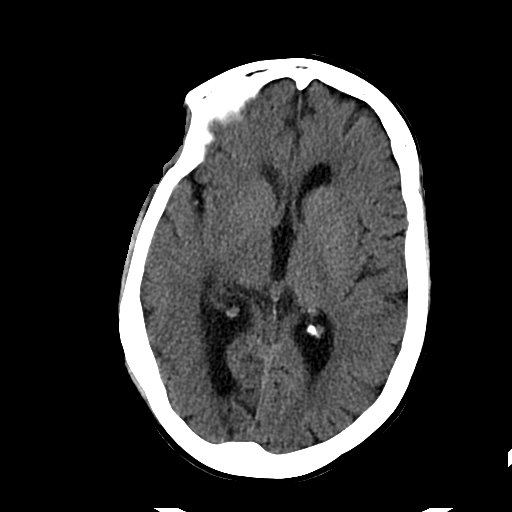
[im 18/32  brain]
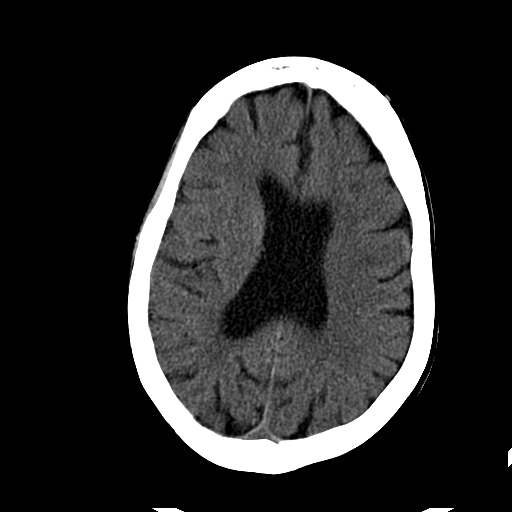
[im 18/32  bone]
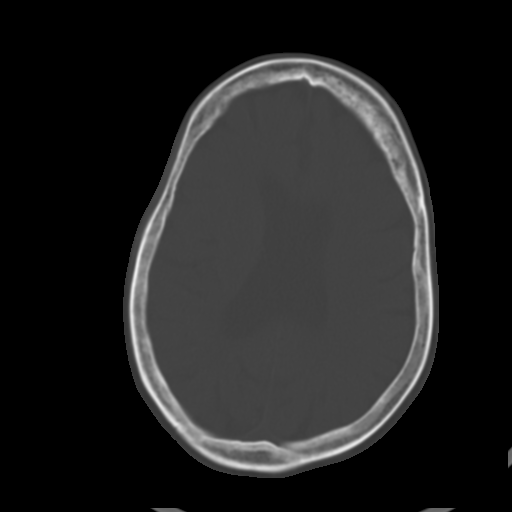
[im 21/32  brain]
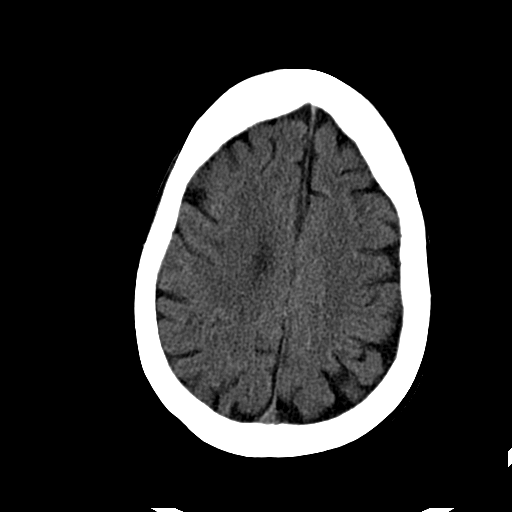
[im 25/32  brain]
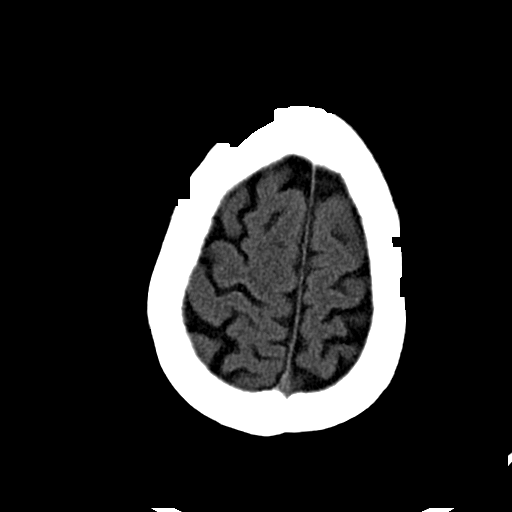
[im 28/32  brain]
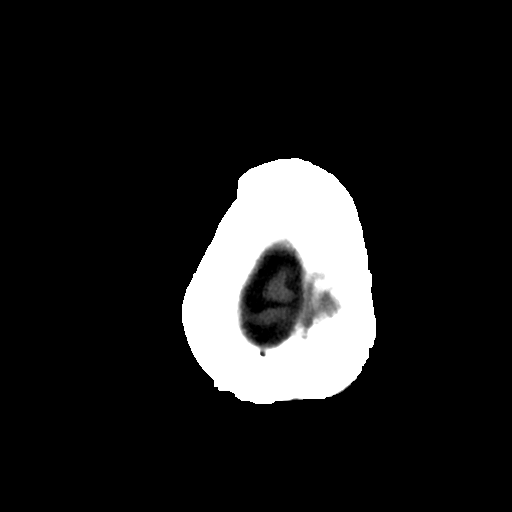

[Series 202: head w/o bone, idose (1) · axial · non-contrast · 0.45mm/px · z∈[+62,+112]mm · 4 of 64 slices shown]
[im 7/64  bone]
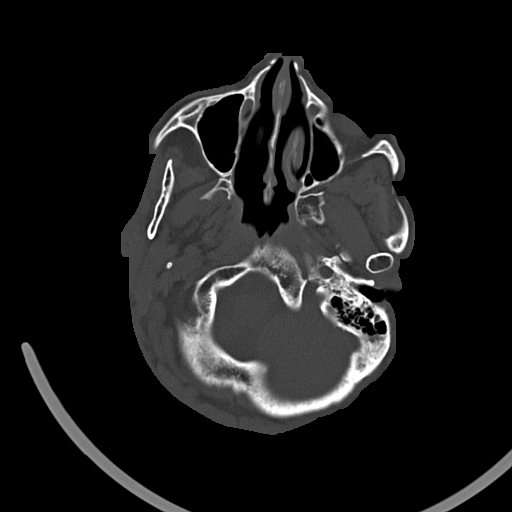
[im 14/64  bone]
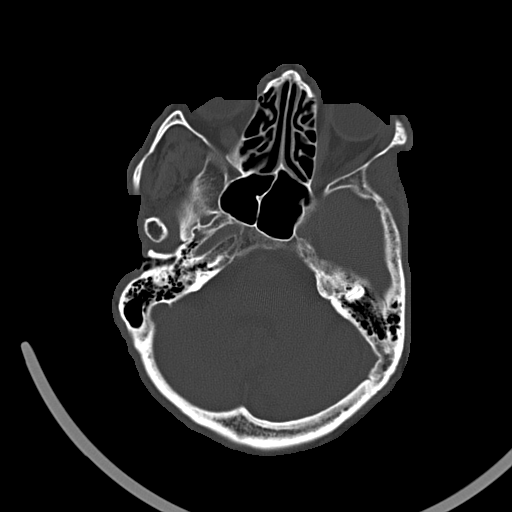
[im 20/64  bone]
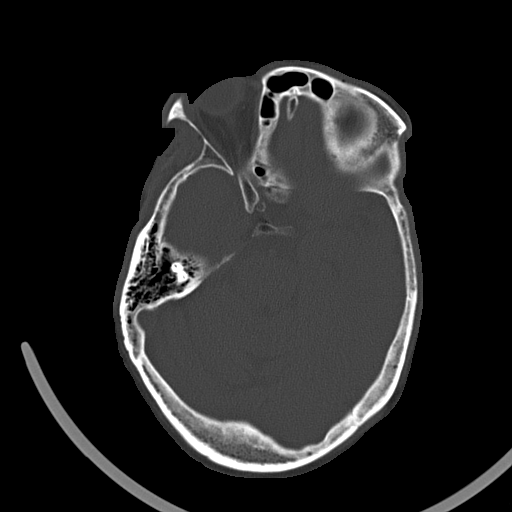
[im 27/64  bone]
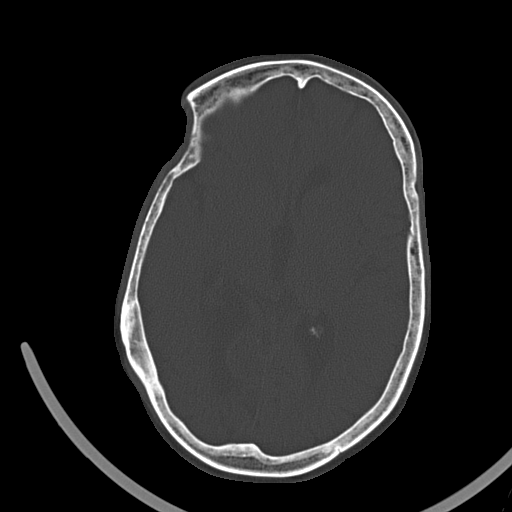

[Series 205: coronal st, idose (1) · coronal · 0.40mm/px · 3 of 71 slices shown]
[im 24/71  brain]
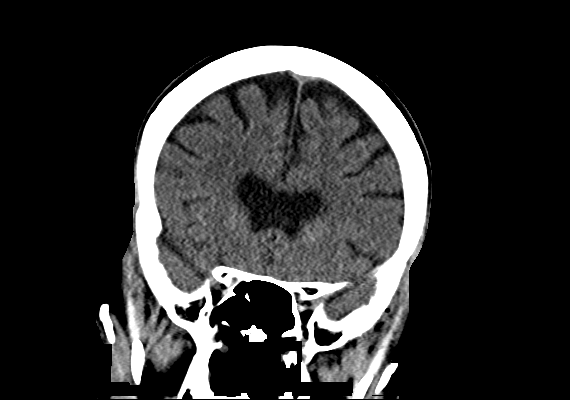
[im 32/71  brain]
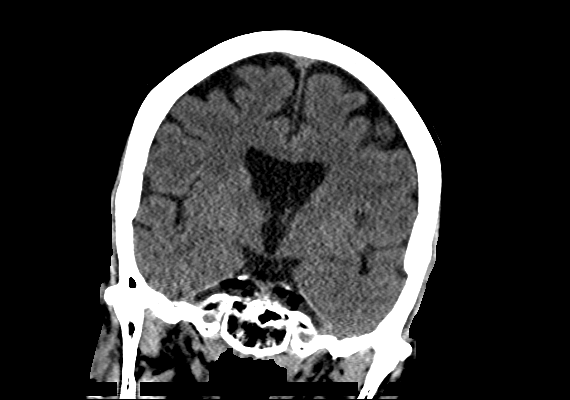
[im 39/71  brain]
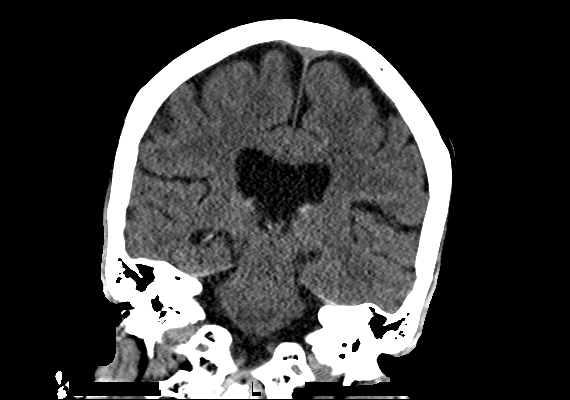

[Series 206: sagittal st, idose (1) · sagittal · 0.40mm/px · 3 of 69 slices shown]
[im 23/69  brain]
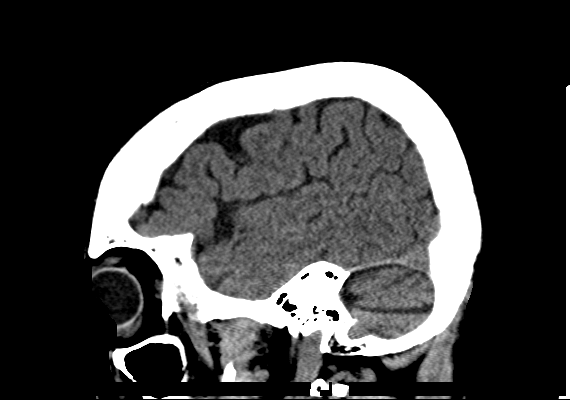
[im 35/69  brain]
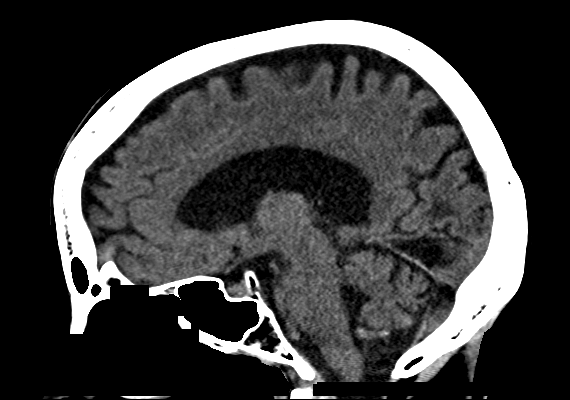
[im 46/69  brain]
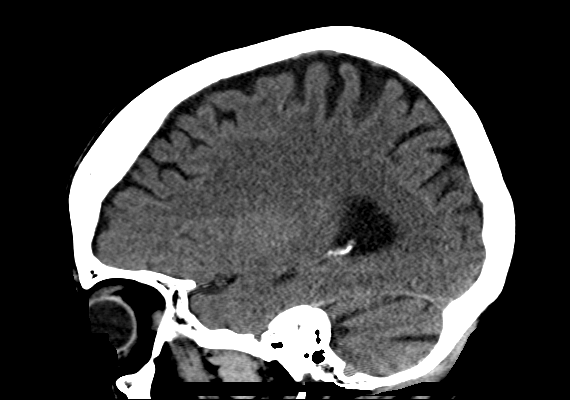

[18 of 47 positions shown; findings below may reference images not displayed]

FINDINGS: BRAIN: There is sulcal and ventricular prominence consistent with
superficial and central atrophy. No intraparenchymal hemorrhage,
mass effect nor midline shift. Mild degree of periventricular and
subcortical white matter hypodensities consistent with chronic small
vessel ischemic disease are identified. No acute large vascular
territory infarcts. No abnormal extra-axial fluid collections. Basal
cisterns are not effaced and midline. Normal variant cavum septum
pellucidum and vergae.

VASCULAR: Moderate calcific atherosclerosis of the carotid siphons.

SKULL: No skull fracture. No significant scalp soft tissue swelling.

SINUSES/ORBITS: The mastoid air-cells are clear. The included
paranasal sinuses are well-aerated.The included ocular globes and
orbital contents are non-suspicious.

OTHER: None.
IMPRESSION: Likely chronic small vessel ischemic disease of periventricular
white matter with cerebral atrophy.

## 2018-03-04 IMAGING — DX DG CHEST 1V PORT
1 series · 1 of 1 positions shown · non-contrast
Comparison: 07/21/2016

CLINICAL DATA: Central line placement

EXAM:
PORTABLE CHEST 1 VIEW

[chest ap]
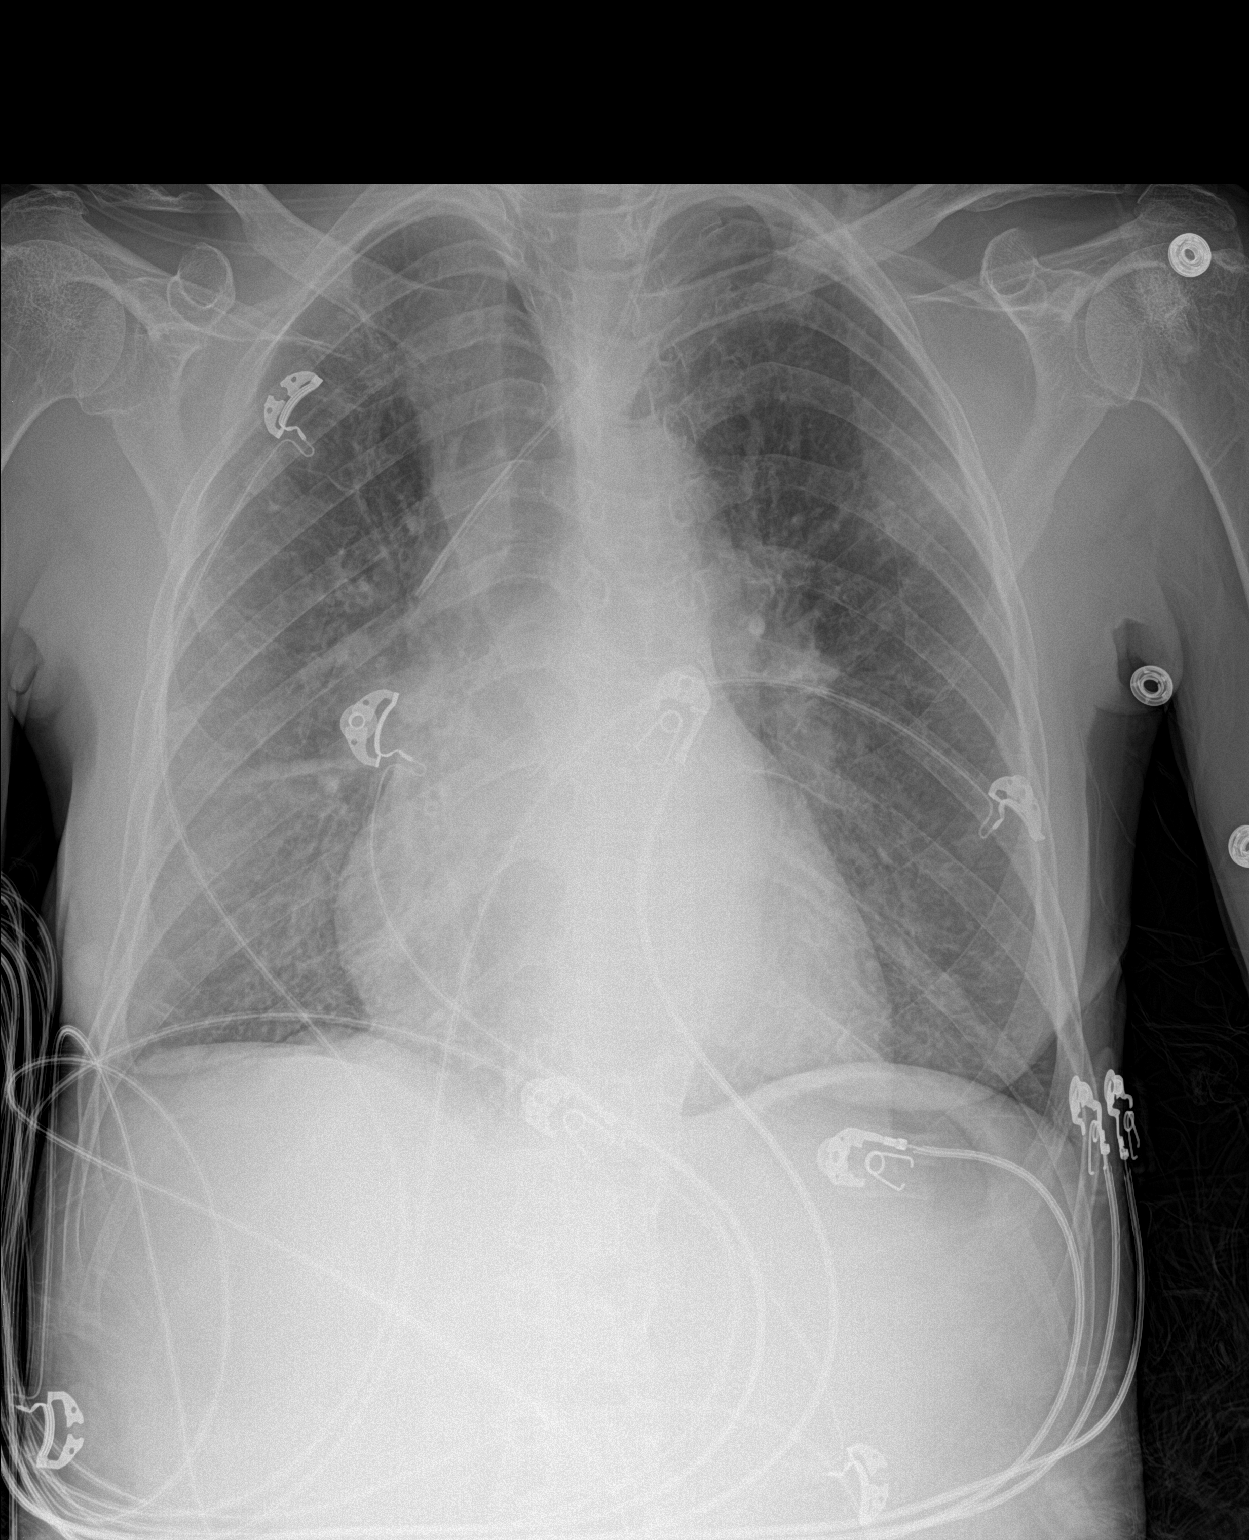

[1 of 1 positions shown; findings below may reference images not displayed]

FINDINGS: There is a left jugular central venous catheter with the tip
projecting over the SVC. There is bilateral diffuse interstitial
thickening. There is a trace right pleural effusion. There is no
left pleural effusion. There is no pneumothorax. There is mild
stable cardiomegaly.

The osseous structures are unremarkable.
IMPRESSION: Mild CHF.

Left jugular central venous catheter with the tip projecting over
the SVC.

## 2018-11-28 IMAGING — DX DG CHEST 1V PORT
1 series · 1 of 1 positions shown · non-contrast
Comparison: Chest x-ray dated March 23, 2017.

CLINICAL DATA: Shortness of breath.

EXAM:
PORTABLE CHEST 1 VIEW

[chest ap]
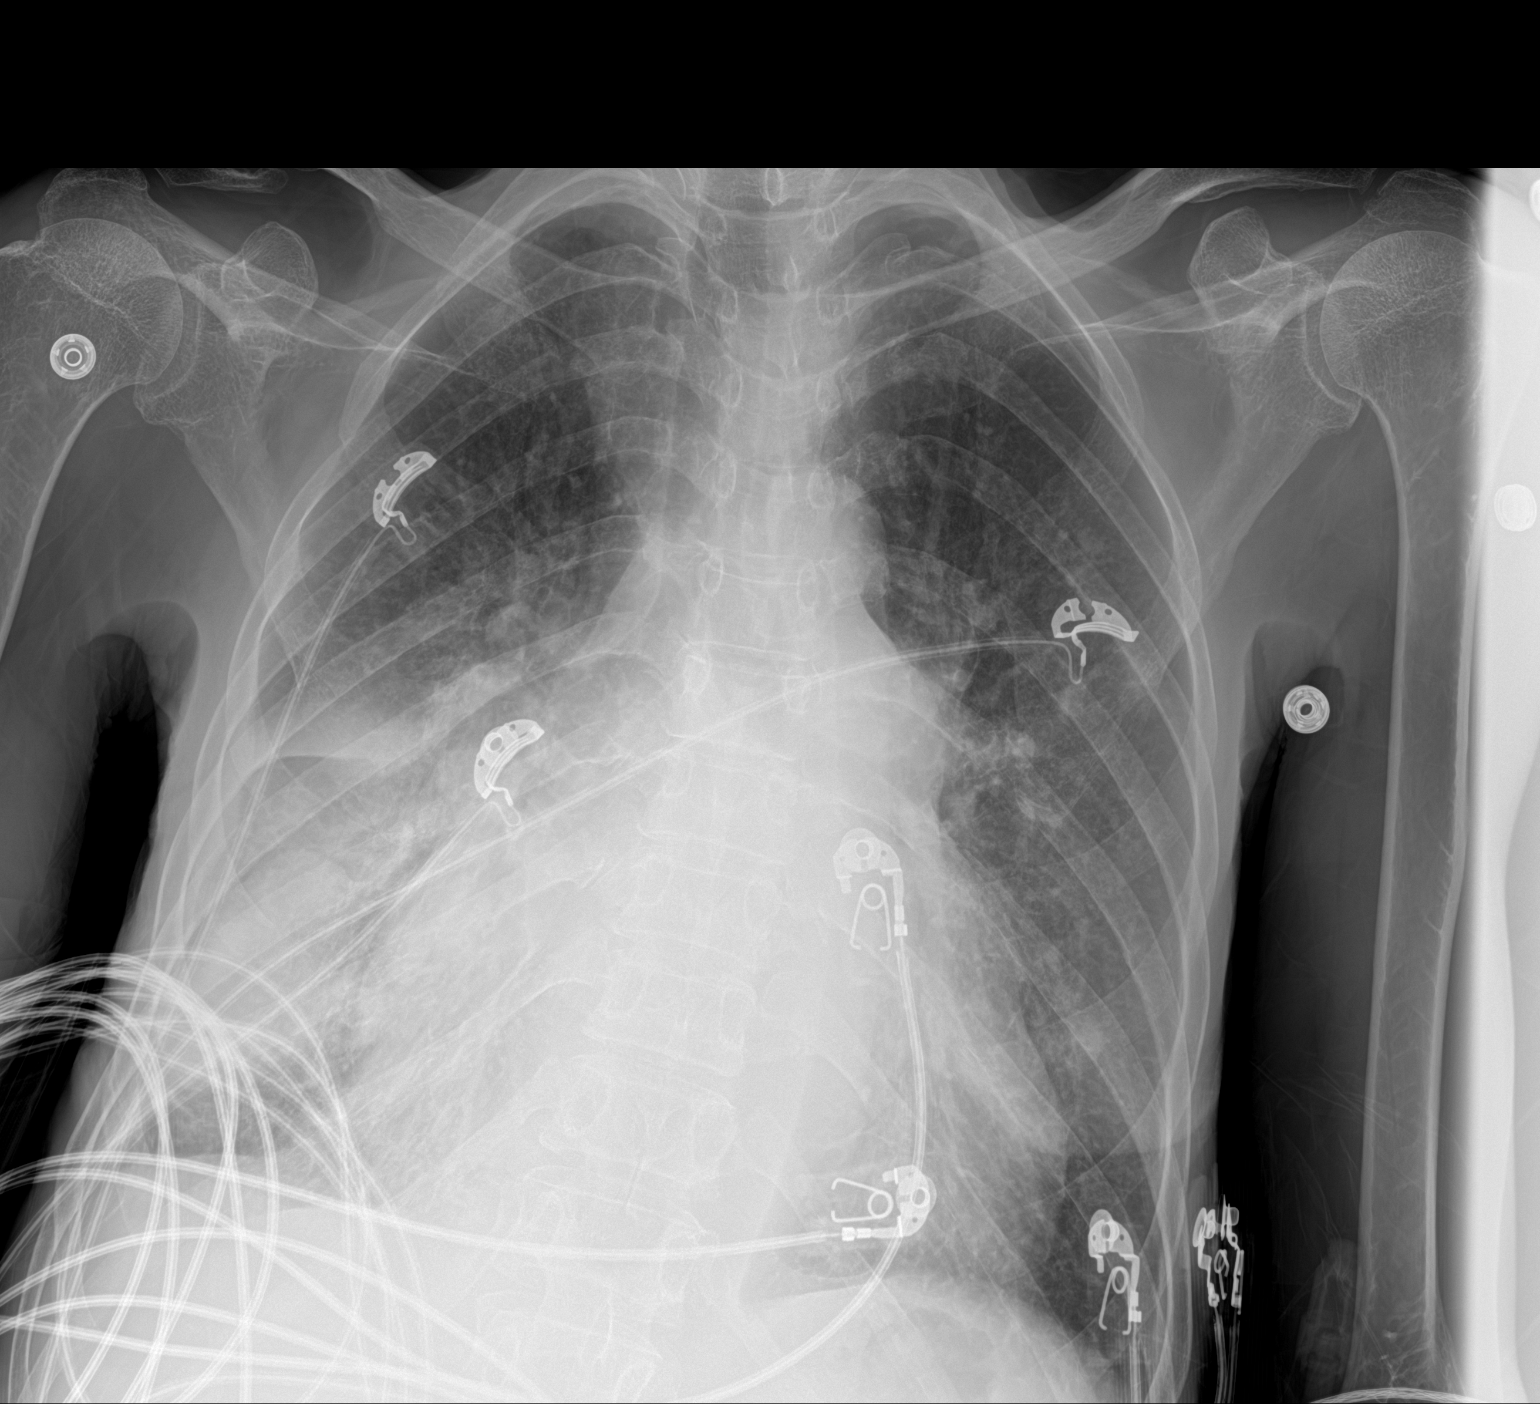

[1 of 1 positions shown; findings below may reference images not displayed]

FINDINGS: Stable cardiomegaly. The lungs remain hyperinflated. Unchanged
opacity in the right mid and lower lung with adjacent layering
pleural fluid in the fissures. No pneumothorax. No acute osseous
abnormality.
IMPRESSION: 1. Unchanged right mid and lower lung airspace opacity and adjacent
pleural effusion.
2. Stable cardiomegaly.
